# Patient Record
Sex: Male | Born: 1950
Health system: Southern US, Community
[De-identification: ages and names within clinical notes are randomized; demographics above are authoritative.]

## PROBLEM LIST (undated history)

## (undated) DIAGNOSIS — J449 Chronic obstructive pulmonary disease, unspecified: Secondary | ICD-10-CM

## (undated) DIAGNOSIS — E559 Vitamin D deficiency, unspecified: Secondary | ICD-10-CM

## (undated) DIAGNOSIS — R1310 Dysphagia, unspecified: Secondary | ICD-10-CM

## (undated) DIAGNOSIS — N390 Urinary tract infection, site not specified: Secondary | ICD-10-CM

## (undated) DIAGNOSIS — Z720 Tobacco use: Secondary | ICD-10-CM

## (undated) DIAGNOSIS — R627 Adult failure to thrive: Secondary | ICD-10-CM

## (undated) DIAGNOSIS — Z21 Asymptomatic human immunodeficiency virus [HIV] infection status: Secondary | ICD-10-CM

## (undated) DIAGNOSIS — H547 Unspecified visual loss: Secondary | ICD-10-CM

## (undated) DIAGNOSIS — N400 Enlarged prostate without lower urinary tract symptoms: Secondary | ICD-10-CM

## (undated) DIAGNOSIS — R569 Unspecified convulsions: Secondary | ICD-10-CM

## (undated) DIAGNOSIS — G9341 Metabolic encephalopathy: Secondary | ICD-10-CM

## (undated) DIAGNOSIS — B20 Human immunodeficiency virus [HIV] disease: Secondary | ICD-10-CM

## (undated) HISTORY — PX: HAND SURGERY: SHX662

---

## 2000-07-07 ENCOUNTER — Encounter: Admission: RE | Admit: 2000-07-07 | Discharge: 2000-07-07 | Payer: Self-pay | Admitting: Internal Medicine

## 2000-07-07 ENCOUNTER — Ambulatory Visit (HOSPITAL_COMMUNITY): Admission: RE | Admit: 2000-07-07 | Discharge: 2000-07-07 | Payer: Self-pay | Admitting: Internal Medicine

## 2001-02-25 ENCOUNTER — Encounter: Admission: RE | Admit: 2001-02-25 | Discharge: 2001-02-25 | Payer: Self-pay | Admitting: Hematology and Oncology

## 2001-02-25 ENCOUNTER — Ambulatory Visit (HOSPITAL_COMMUNITY): Admission: RE | Admit: 2001-02-25 | Discharge: 2001-02-25 | Payer: Self-pay | Admitting: Hematology and Oncology

## 2001-10-17 ENCOUNTER — Emergency Department (HOSPITAL_COMMUNITY): Admission: EM | Admit: 2001-10-17 | Discharge: 2001-10-17 | Payer: Self-pay | Admitting: Emergency Medicine

## 2002-09-21 ENCOUNTER — Ambulatory Visit (HOSPITAL_COMMUNITY): Admission: RE | Admit: 2002-09-21 | Discharge: 2002-09-21 | Payer: Self-pay | Admitting: Internal Medicine

## 2002-09-21 ENCOUNTER — Encounter: Admission: RE | Admit: 2002-09-21 | Discharge: 2002-09-21 | Payer: Self-pay | Admitting: Internal Medicine

## 2003-04-05 ENCOUNTER — Encounter: Admission: RE | Admit: 2003-04-05 | Discharge: 2003-04-05 | Payer: Self-pay | Admitting: Internal Medicine

## 2003-07-14 ENCOUNTER — Emergency Department (HOSPITAL_COMMUNITY): Admission: EM | Admit: 2003-07-14 | Discharge: 2003-07-14 | Payer: Self-pay | Admitting: Emergency Medicine

## 2003-09-01 ENCOUNTER — Ambulatory Visit (HOSPITAL_COMMUNITY): Admission: RE | Admit: 2003-09-01 | Discharge: 2003-09-01 | Payer: Self-pay | Admitting: Internal Medicine

## 2003-09-01 ENCOUNTER — Encounter: Admission: RE | Admit: 2003-09-01 | Discharge: 2003-09-01 | Payer: Self-pay | Admitting: Internal Medicine

## 2003-09-29 ENCOUNTER — Encounter: Admission: RE | Admit: 2003-09-29 | Discharge: 2003-09-29 | Payer: Self-pay | Admitting: Internal Medicine

## 2004-03-22 ENCOUNTER — Ambulatory Visit (HOSPITAL_COMMUNITY): Admission: RE | Admit: 2004-03-22 | Discharge: 2004-03-22 | Payer: Self-pay | Admitting: Internal Medicine

## 2004-03-22 ENCOUNTER — Encounter: Admission: RE | Admit: 2004-03-22 | Discharge: 2004-03-22 | Payer: Self-pay | Admitting: Internal Medicine

## 2004-05-30 ENCOUNTER — Inpatient Hospital Stay (HOSPITAL_COMMUNITY): Admission: EM | Admit: 2004-05-30 | Discharge: 2004-06-06 | Payer: Self-pay | Admitting: Emergency Medicine

## 2004-05-30 ENCOUNTER — Emergency Department (HOSPITAL_COMMUNITY): Admission: EM | Admit: 2004-05-30 | Discharge: 2004-05-30 | Payer: Self-pay | Admitting: Emergency Medicine

## 2004-06-13 ENCOUNTER — Emergency Department (HOSPITAL_COMMUNITY): Admission: EM | Admit: 2004-06-13 | Discharge: 2004-06-13 | Payer: Self-pay | Admitting: Emergency Medicine

## 2004-06-19 ENCOUNTER — Emergency Department (HOSPITAL_COMMUNITY): Admission: EM | Admit: 2004-06-19 | Discharge: 2004-06-19 | Payer: Self-pay | Admitting: *Deleted

## 2004-06-21 ENCOUNTER — Encounter: Admission: RE | Admit: 2004-06-21 | Discharge: 2004-06-21 | Payer: Self-pay | Admitting: Internal Medicine

## 2004-06-21 ENCOUNTER — Ambulatory Visit (HOSPITAL_COMMUNITY): Admission: RE | Admit: 2004-06-21 | Discharge: 2004-06-21 | Payer: Self-pay | Admitting: Internal Medicine

## 2004-06-21 ENCOUNTER — Encounter (INDEPENDENT_AMBULATORY_CARE_PROVIDER_SITE_OTHER): Payer: Self-pay | Admitting: *Deleted

## 2004-06-21 LAB — CONVERTED CEMR LAB
CD4 Count: 9 uL
CD4 T Cell Abs: 9
HIV 1 RNA Quant: 161000 {copies}/mL

## 2004-07-01 ENCOUNTER — Emergency Department (HOSPITAL_COMMUNITY): Admission: EM | Admit: 2004-07-01 | Discharge: 2004-07-01 | Payer: Self-pay | Admitting: Emergency Medicine

## 2004-07-09 ENCOUNTER — Inpatient Hospital Stay (HOSPITAL_COMMUNITY): Admission: EM | Admit: 2004-07-09 | Discharge: 2004-07-12 | Payer: Self-pay | Admitting: Emergency Medicine

## 2004-07-14 ENCOUNTER — Inpatient Hospital Stay (HOSPITAL_COMMUNITY): Admission: EM | Admit: 2004-07-14 | Discharge: 2004-07-20 | Payer: Self-pay | Admitting: *Deleted

## 2004-08-06 ENCOUNTER — Inpatient Hospital Stay (HOSPITAL_COMMUNITY): Admission: EM | Admit: 2004-08-06 | Discharge: 2004-08-08 | Payer: Self-pay

## 2004-08-14 ENCOUNTER — Encounter: Admission: RE | Admit: 2004-08-14 | Discharge: 2004-08-14 | Payer: Self-pay | Admitting: Internal Medicine

## 2004-08-30 ENCOUNTER — Emergency Department (HOSPITAL_COMMUNITY): Admission: EM | Admit: 2004-08-30 | Discharge: 2004-08-30 | Payer: Self-pay | Admitting: *Deleted

## 2004-09-07 ENCOUNTER — Emergency Department (HOSPITAL_COMMUNITY): Admission: EM | Admit: 2004-09-07 | Discharge: 2004-09-07 | Payer: Self-pay | Admitting: Emergency Medicine

## 2004-09-10 ENCOUNTER — Emergency Department (HOSPITAL_COMMUNITY): Admission: EM | Admit: 2004-09-10 | Discharge: 2004-09-10 | Payer: Self-pay | Admitting: Emergency Medicine

## 2004-10-08 ENCOUNTER — Ambulatory Visit (HOSPITAL_COMMUNITY): Admission: RE | Admit: 2004-10-08 | Discharge: 2004-10-08 | Payer: Self-pay | Admitting: Infectious Diseases

## 2004-10-08 ENCOUNTER — Ambulatory Visit: Payer: Self-pay | Admitting: Infectious Diseases

## 2004-10-08 ENCOUNTER — Encounter (INDEPENDENT_AMBULATORY_CARE_PROVIDER_SITE_OTHER): Payer: Self-pay | Admitting: *Deleted

## 2004-10-08 LAB — CONVERTED CEMR LAB: CD4 Count: 9 microliters

## 2004-11-24 ENCOUNTER — Emergency Department (HOSPITAL_COMMUNITY): Admission: EM | Admit: 2004-11-24 | Discharge: 2004-11-24 | Payer: Self-pay | Admitting: Emergency Medicine

## 2005-08-31 IMAGING — CT CT CERVICAL SPINE W/O CM
3 of 4 series · 16 of 33 positions shown, 19 images · non-contrast
Comparison: none

[Series 5028: — · axial · 0.41mm/px · z∈[-802,-652]mm · 8 of 126 slices shown, 10 images (1 of 3)]
[im 13/126  soft-tissue]
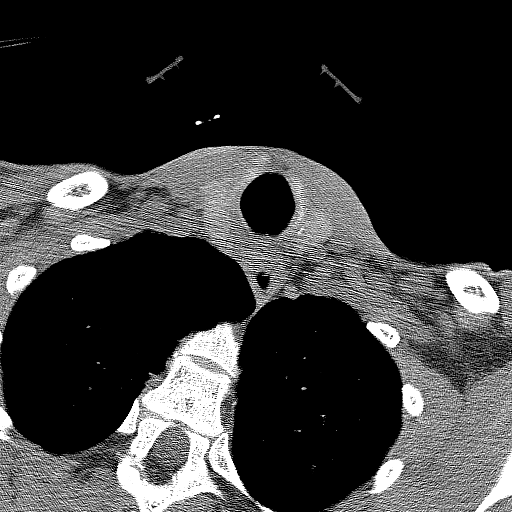
[im 13/126  bone]
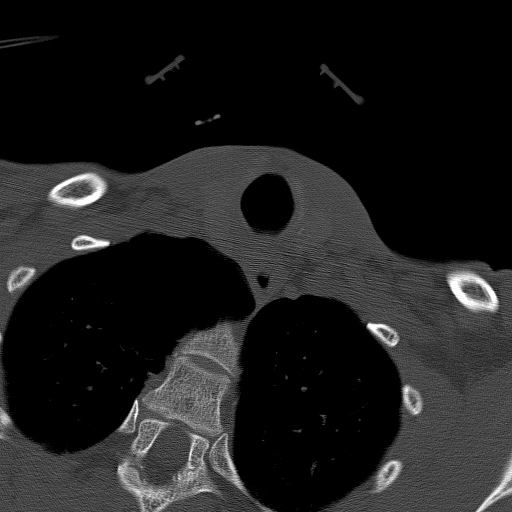
[im 26/126  bone]
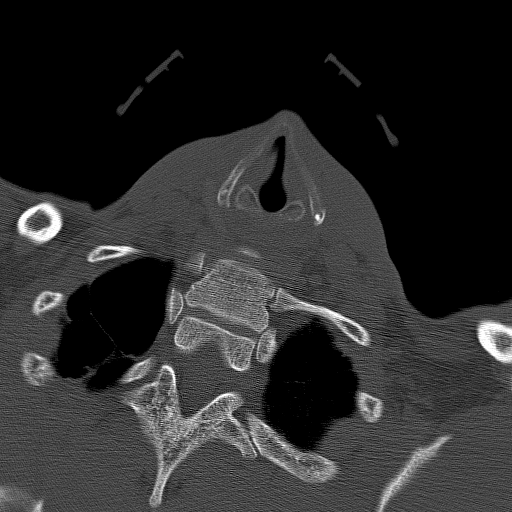
[im 38/126  bone]
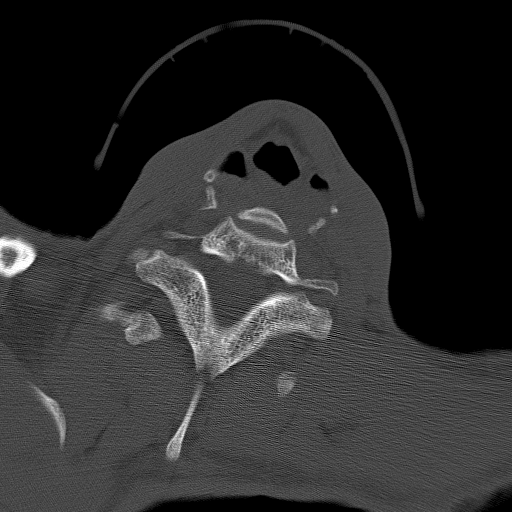
[im 51/126  bone]
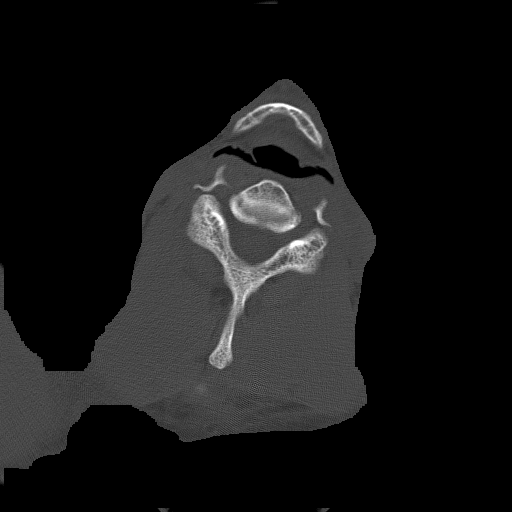
[im 76/126  soft-tissue]
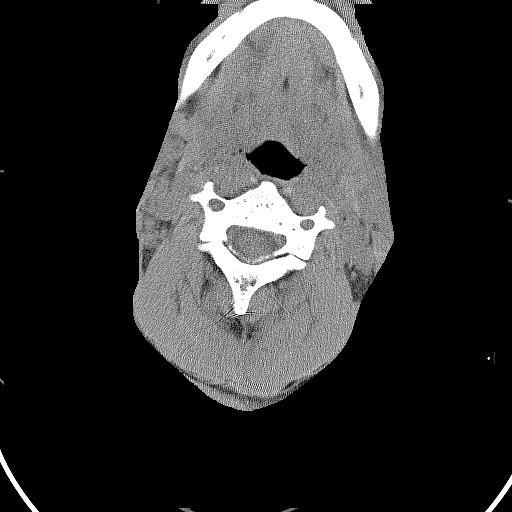
[im 76/126  bone]
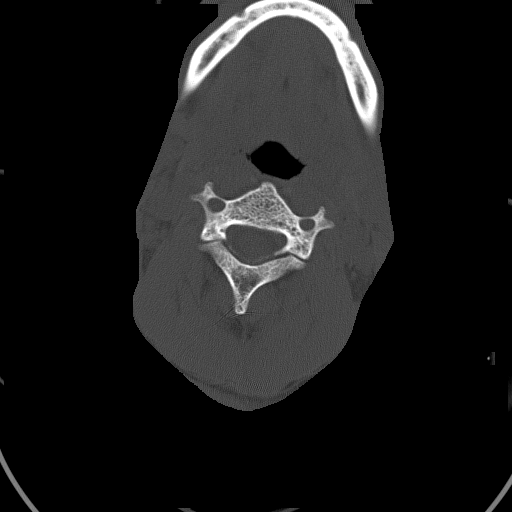
[im 88/126  bone]
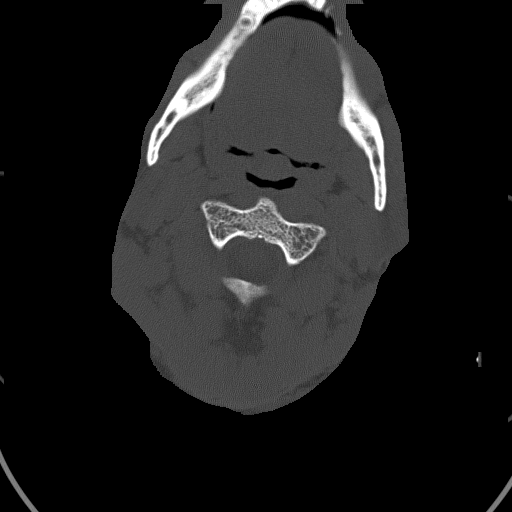
[im 101/126  bone]
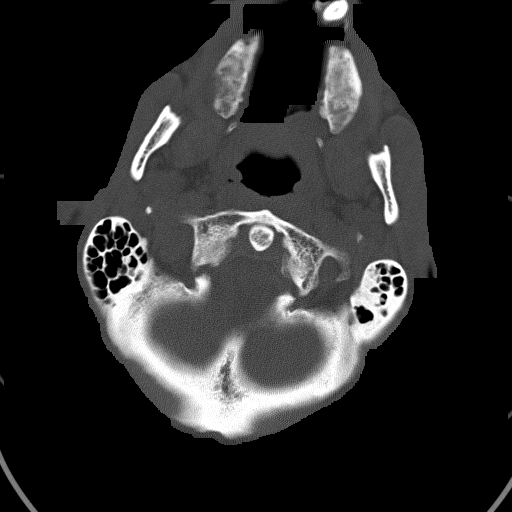
[im 113/126  bone]
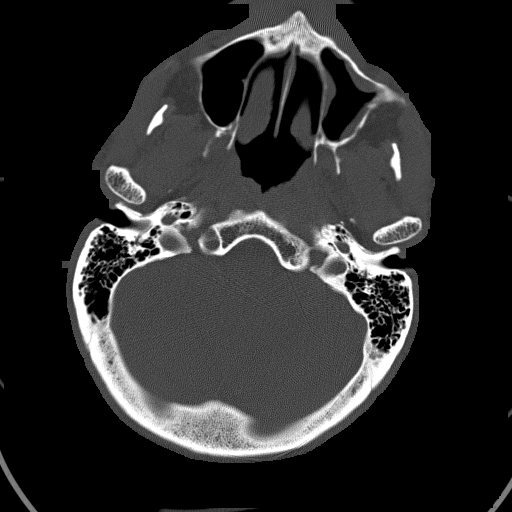

[— · coronal · 0.41mm/px · 3 of 30 slices shown (2 of 3)]
[im 6/30  bone]
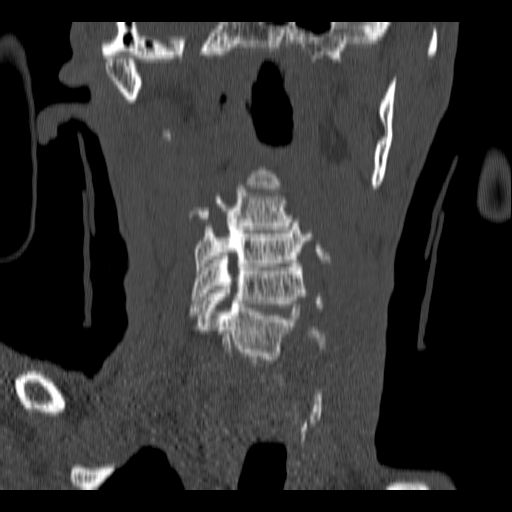
[im 12/30  bone]
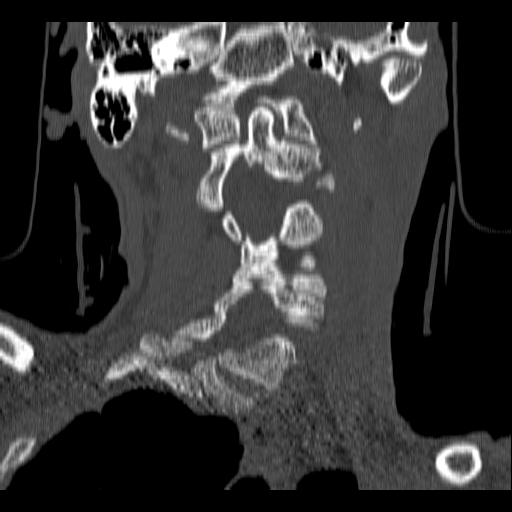
[im 18/30  bone]
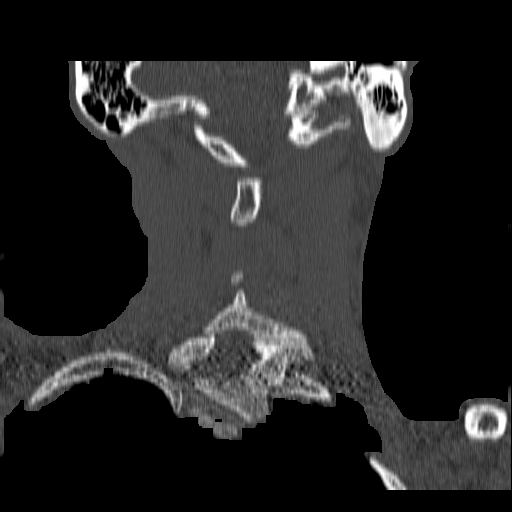

[— · sagittal · 0.41mm/px · 5 of 40 slices shown, 6 images (3 of 3)]
[im 14/40  bone]
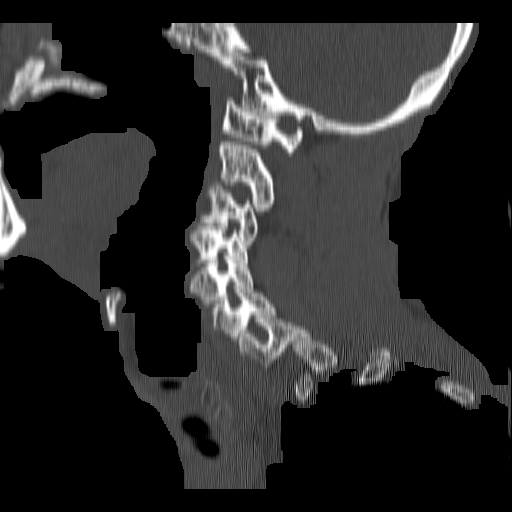
[im 17/40  bone]
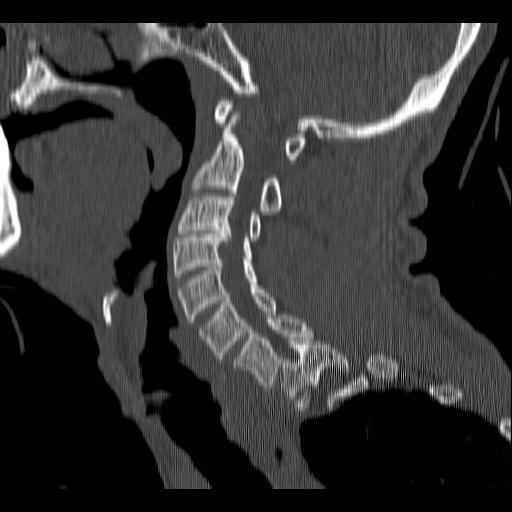
[im 20/40  soft-tissue]
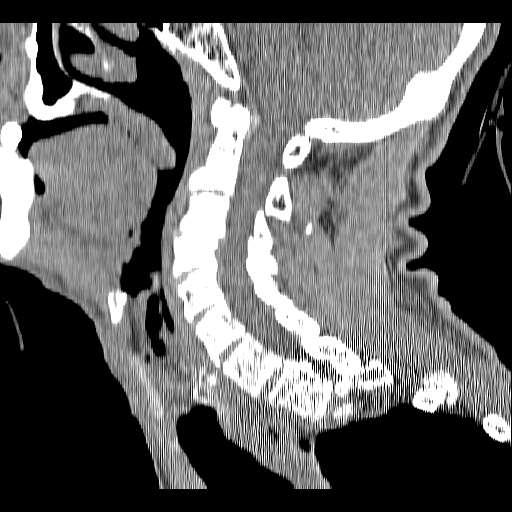
[im 20/40  bone]
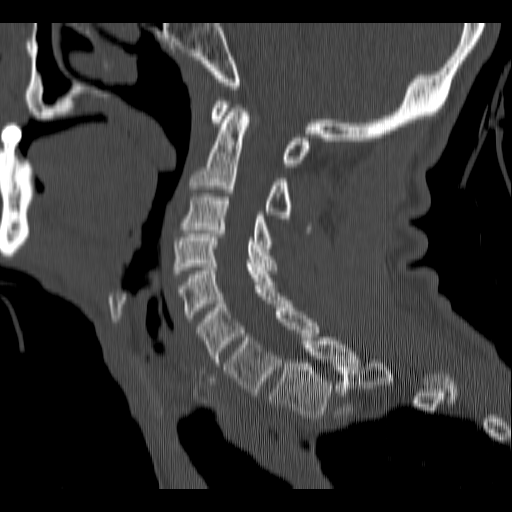
[im 23/40  bone]
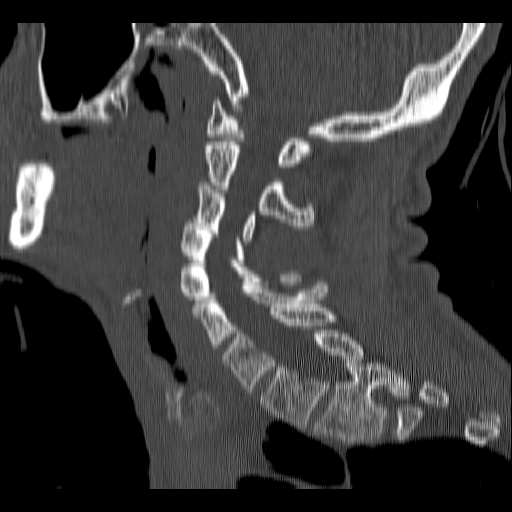
[im 27/40  bone]
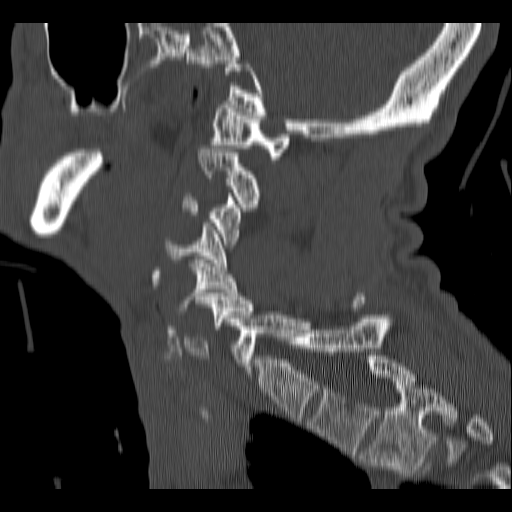

[16 of 33 positions shown; findings below may reference images not displayed]

<!--  IDXRADR:ADDEND:BEGIN -->Addendum Begins<!--  IDXRADR:ADDEND:INNER_BEGIN -->The above report for the repeat CT of the head performed at 2666 hours fails to indicate that the exam was performed following IV contrast administration of 100 cc Omnipaque intravenously.  No pathologic intracranial enhancement is seen following contrast.
 IMPRESSION
 No acute intracranial abnormalities.

 <!--  IDXRADR:ADDEND:INNER_END -->Addendum Ends
<!--  IDXRADR:ADDEND:END -->Clinical Data: Patient with seizures, confusion.

 CT OF THE CERVICAL SPINE WITH MULTIPLANAR RECONSTRUCTIONS
 Axial images were made of the cervical spine and demonstrate considerable scoliosis.  There is a fluid level in the left maxillary sinus with some mucosal thickening.  There is no acute bone abnormality of the skull base or the cervical spine.  

 The multiplanar reconstructions are somewhat compromised due to the patient's scoliosis.  There is degenerative disk disease demonstrated at C3-4.   Normal alignment is present with no gross facet abnormality. 

 IMPRESSION
 No fracture. 

 Degenerative disk disease, C3-4. 

 CT BRAIN SCAN WITHOUT CONTRAST
 This is a repeat brain scan without contrast.  The patient fell since the prior exam.  The first exam on 05/30/04 was performed at 9777 PM and the second was performed at 2666 PM.  5 mm images through the brain without contrast enhancement demonstrate minimal atrophy.  There is no intracerebral mass effect or hemorrhage.  The ventricular system is normal.  There is some mucosal thickening of the left maxillary sinus with a small fluid level. 

 IMPRESSION
 No acute intracranial abnormality, unchanged. 

 Very small fluid level in the left maxillary sinus with mucosal thickening in the left maxillary sinus. 

 [REDACTED]

## 2005-09-06 IMAGING — CR DG CHEST 2V
2 series · 2 of 2 positions shown · non-contrast
Comparison: none

CLINICAL DATA: Chest pain/short of breath.  
 CHEST TWO VIEWS
 Two view chest with comparison 09/03/99.
 Moderate COPD.  There is an abnormal density in the right upper lobe that is either an occult infiltrate or nodule.  This is seen only on the PA view.  It is a new finding and warrants careful follow-up.  Consider CT if this is not resolved quickly.
 IMPRESSION
 1.  COPD.  
 2.  Right upper lobe nodule or infiltrate--needs careful follow-up.

[view not recorded (1 of 2)]
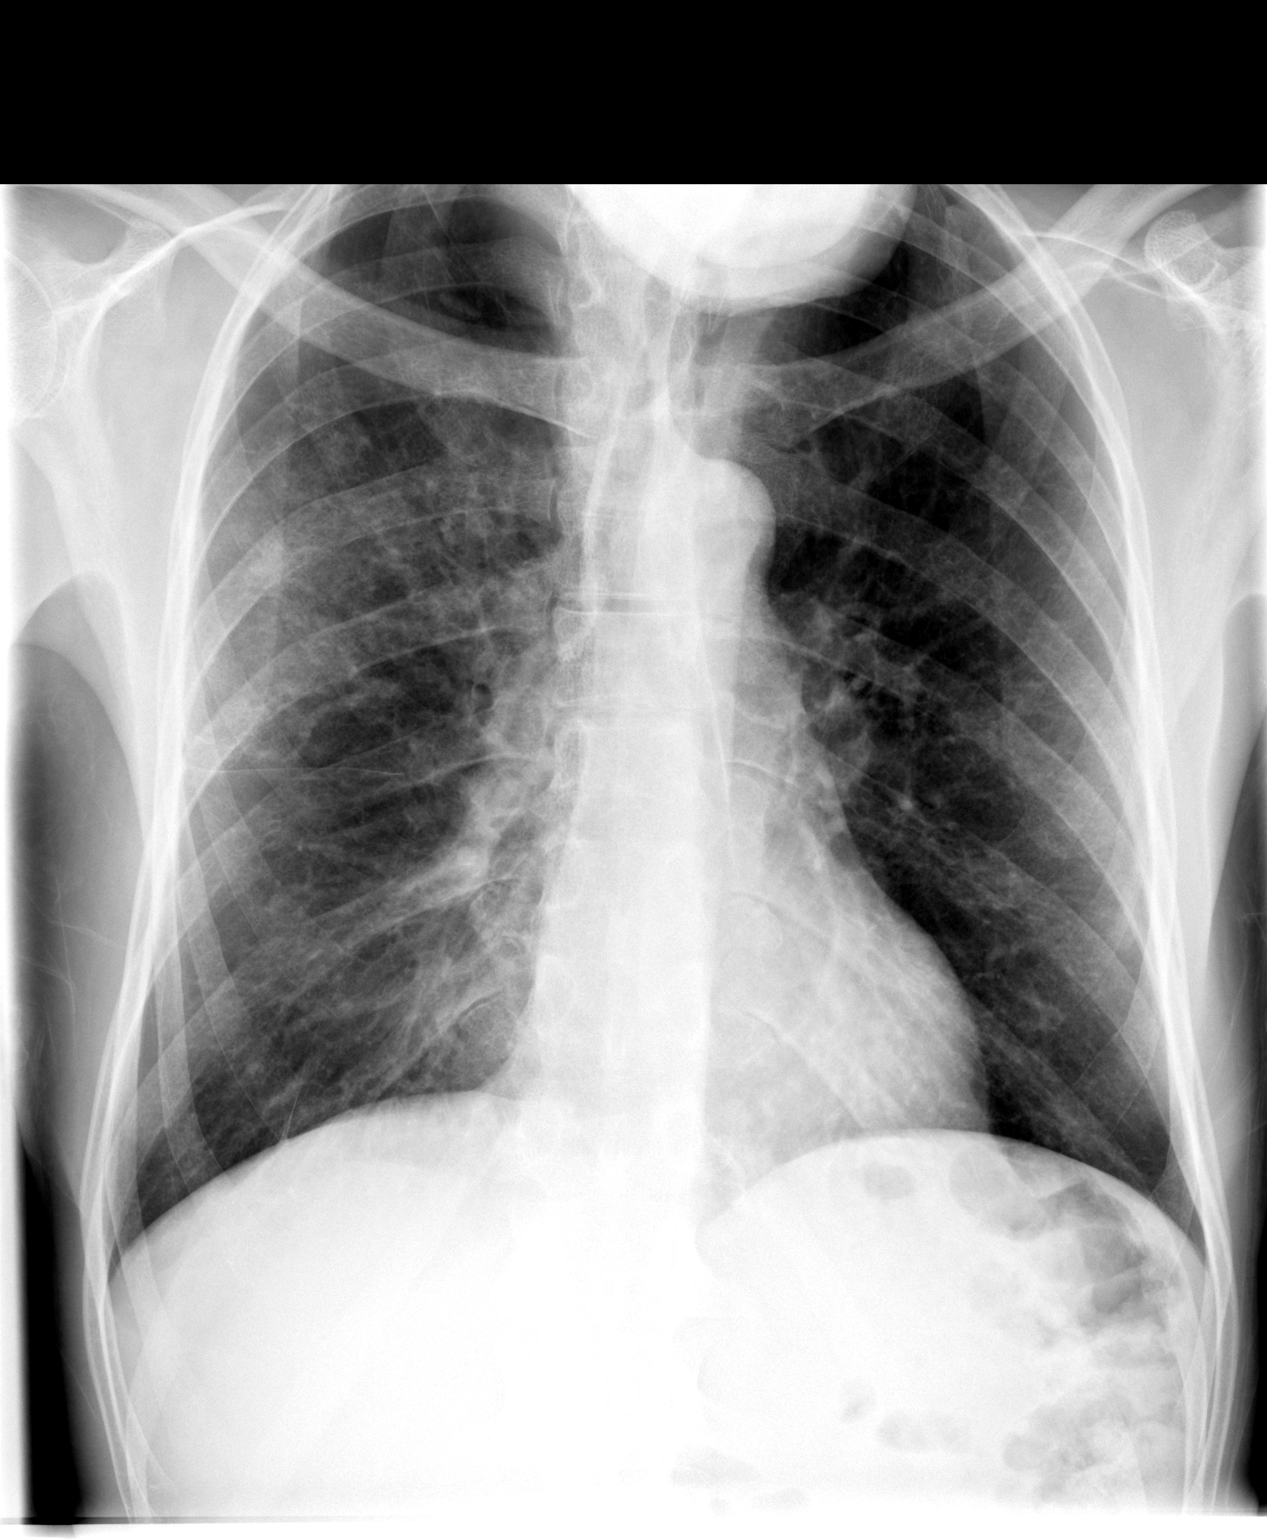

[view not recorded (2 of 2)]
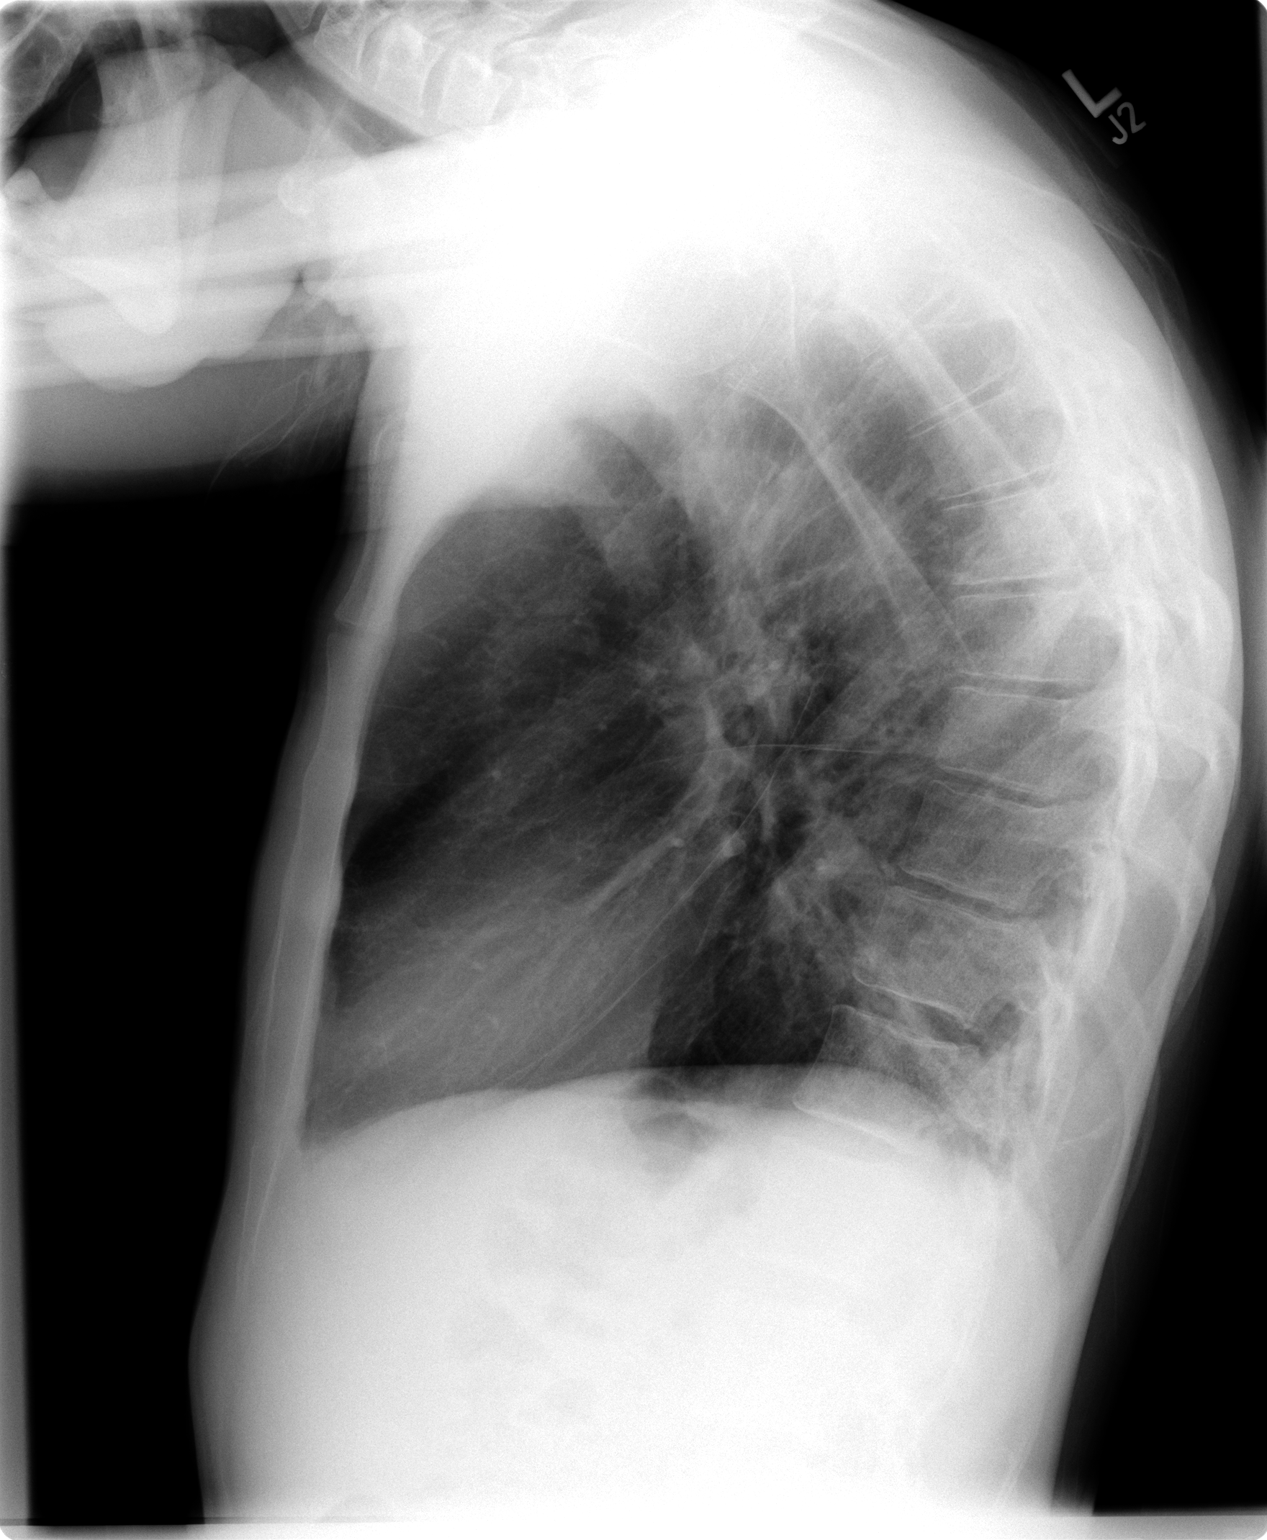

[2 of 2 positions shown; findings below may reference images not displayed]

## 2006-05-23 ENCOUNTER — Encounter (INDEPENDENT_AMBULATORY_CARE_PROVIDER_SITE_OTHER): Payer: Self-pay | Admitting: Infectious Diseases

## 2006-07-09 ENCOUNTER — Ambulatory Visit: Payer: Self-pay | Admitting: Internal Medicine

## 2006-07-09 ENCOUNTER — Encounter (INDEPENDENT_AMBULATORY_CARE_PROVIDER_SITE_OTHER): Payer: Self-pay | Admitting: *Deleted

## 2006-07-09 ENCOUNTER — Encounter: Admission: RE | Admit: 2006-07-09 | Discharge: 2006-07-09 | Payer: Self-pay | Admitting: Infectious Diseases

## 2006-07-09 LAB — CONVERTED CEMR LAB: CD4 Count: 330 microliters

## 2006-07-23 ENCOUNTER — Ambulatory Visit: Payer: Self-pay | Admitting: Internal Medicine

## 2006-10-22 DIAGNOSIS — A6000 Herpesviral infection of urogenital system, unspecified: Secondary | ICD-10-CM | POA: Insufficient documentation

## 2006-10-22 DIAGNOSIS — B59 Pneumocystosis: Secondary | ICD-10-CM | POA: Insufficient documentation

## 2006-10-22 DIAGNOSIS — J449 Chronic obstructive pulmonary disease, unspecified: Secondary | ICD-10-CM

## 2006-10-22 DIAGNOSIS — J4489 Other specified chronic obstructive pulmonary disease: Secondary | ICD-10-CM | POA: Insufficient documentation

## 2006-10-22 DIAGNOSIS — B20 Human immunodeficiency virus [HIV] disease: Secondary | ICD-10-CM

## 2006-10-22 DIAGNOSIS — R569 Unspecified convulsions: Secondary | ICD-10-CM

## 2007-01-23 ENCOUNTER — Encounter (INDEPENDENT_AMBULATORY_CARE_PROVIDER_SITE_OTHER): Payer: Self-pay | Admitting: Infectious Diseases

## 2007-02-09 ENCOUNTER — Encounter: Payer: Self-pay | Admitting: Internal Medicine

## 2007-02-23 ENCOUNTER — Encounter (INDEPENDENT_AMBULATORY_CARE_PROVIDER_SITE_OTHER): Payer: Self-pay | Admitting: *Deleted

## 2007-02-23 LAB — CONVERTED CEMR LAB

## 2007-02-25 ENCOUNTER — Emergency Department (HOSPITAL_COMMUNITY): Admission: EM | Admit: 2007-02-25 | Discharge: 2007-02-25 | Payer: Self-pay | Admitting: Emergency Medicine

## 2007-03-08 ENCOUNTER — Encounter (INDEPENDENT_AMBULATORY_CARE_PROVIDER_SITE_OTHER): Payer: Self-pay | Admitting: *Deleted

## 2007-03-18 ENCOUNTER — Ambulatory Visit: Payer: Self-pay | Admitting: Internal Medicine

## 2007-03-18 ENCOUNTER — Encounter: Admission: RE | Admit: 2007-03-18 | Discharge: 2007-03-18 | Payer: Self-pay | Admitting: Internal Medicine

## 2007-03-18 LAB — CONVERTED CEMR LAB
ALT: 20 units/L (ref 0–53)
AST: 15 units/L (ref 0–37)
Alkaline Phosphatase: 123 units/L — ABNORMAL HIGH (ref 39–117)
BUN: 16 mg/dL (ref 6–23)
CO2: 20 meq/L (ref 19–32)
Chloride: 102 meq/L (ref 96–112)
Creatinine, Ser: 1.08 mg/dL (ref 0.40–1.50)
Eosinophils Absolute: 0.1 10*3/uL (ref 0.0–0.7)
Glucose, Bld: 82 mg/dL (ref 70–99)
Lymphocytes Relative: 45 % (ref 12–46)
Lymphs Abs: 2.2 10*3/uL (ref 0.7–3.3)
MCV: 97.2 fL (ref 78.0–100.0)
Monocytes Relative: 10 % (ref 3–11)
Neutro Abs: 2.1 10*3/uL (ref 1.7–7.7)
Neutrophils Relative %: 43 % (ref 43–77)
Potassium: 4.3 meq/L (ref 3.5–5.3)
RBC: 4.32 M/uL (ref 4.22–5.81)
Sodium: 134 meq/L — ABNORMAL LOW (ref 135–145)
Total Bilirubin: 0.7 mg/dL (ref 0.3–1.2)
WBC: 5 10*3/uL (ref 4.0–10.5)

## 2007-04-07 ENCOUNTER — Emergency Department (HOSPITAL_COMMUNITY): Admission: EM | Admit: 2007-04-07 | Discharge: 2007-04-07 | Payer: Self-pay | Admitting: Emergency Medicine

## 2007-04-07 ENCOUNTER — Telehealth: Payer: Self-pay | Admitting: Internal Medicine

## 2007-04-20 ENCOUNTER — Telehealth: Payer: Self-pay | Admitting: Internal Medicine

## 2007-05-14 ENCOUNTER — Telehealth: Payer: Self-pay | Admitting: Infectious Diseases

## 2007-06-12 ENCOUNTER — Emergency Department (HOSPITAL_COMMUNITY): Admission: EM | Admit: 2007-06-12 | Discharge: 2007-06-12 | Payer: Self-pay | Admitting: Emergency Medicine

## 2007-06-30 ENCOUNTER — Telehealth: Payer: Self-pay | Admitting: Internal Medicine

## 2007-07-27 ENCOUNTER — Telehealth: Payer: Self-pay | Admitting: Internal Medicine

## 2007-08-14 ENCOUNTER — Encounter (INDEPENDENT_AMBULATORY_CARE_PROVIDER_SITE_OTHER): Payer: Self-pay | Admitting: *Deleted

## 2007-08-19 ENCOUNTER — Encounter: Admission: RE | Admit: 2007-08-19 | Discharge: 2007-08-19 | Payer: Self-pay | Admitting: Internal Medicine

## 2007-08-19 ENCOUNTER — Ambulatory Visit: Payer: Self-pay | Admitting: Internal Medicine

## 2007-08-19 DIAGNOSIS — H409 Unspecified glaucoma: Secondary | ICD-10-CM

## 2007-08-19 DIAGNOSIS — K029 Dental caries, unspecified: Secondary | ICD-10-CM | POA: Insufficient documentation

## 2007-08-19 LAB — CONVERTED CEMR LAB
Albumin: 4.2 g/dL (ref 3.5–5.2)
Alkaline Phosphatase: 121 units/L — ABNORMAL HIGH (ref 39–117)
BUN: 13 mg/dL (ref 6–23)
Calcium: 10.1 mg/dL (ref 8.4–10.5)
Eosinophils Absolute: 0.2 10*3/uL (ref 0.0–0.7)
Glucose, Bld: 69 mg/dL — ABNORMAL LOW (ref 70–99)
HCT: 44.2 % (ref 39.0–52.0)
HDL: 32 mg/dL — ABNORMAL LOW (ref >39)
HIV 1 RNA Quant: <50 copies/mL (ref <50)
HIV-1 RNA Quant, Log: <1.7 (ref <1.70)
Hemoglobin: 14.9 g/dL (ref 13.0–17.0)
LDL Cholesterol: 111 mg/dL — ABNORMAL HIGH (ref 0–99)
Lymphs Abs: 2.7 10*3/uL (ref 0.7–3.3)
MCV: 98.9 fL (ref 78.0–100.0)
Monocytes Absolute: 0.4 10*3/uL (ref 0.2–0.7)
Monocytes Relative: 9 % (ref 3–11)
Neutrophils Relative %: 34 % — ABNORMAL LOW (ref 43–77)
Potassium: 4.1 meq/L (ref 3.5–5.3)
RBC: 4.47 M/uL (ref 4.22–5.81)
Triglycerides: 109 mg/dL (ref <150)
WBC: 5 10*3/uL (ref 4.0–10.5)

## 2007-09-01 ENCOUNTER — Telehealth: Payer: Self-pay | Admitting: Internal Medicine

## 2007-09-23 ENCOUNTER — Emergency Department (HOSPITAL_COMMUNITY): Admission: EM | Admit: 2007-09-23 | Discharge: 2007-09-23 | Payer: Self-pay | Admitting: Emergency Medicine

## 2007-09-30 ENCOUNTER — Telehealth: Payer: Self-pay | Admitting: Internal Medicine

## 2007-10-01 ENCOUNTER — Telehealth: Payer: Self-pay | Admitting: Internal Medicine

## 2007-10-13 ENCOUNTER — Telehealth: Payer: Self-pay | Admitting: Internal Medicine

## 2007-10-20 ENCOUNTER — Encounter: Payer: Self-pay | Admitting: Internal Medicine

## 2007-11-14 ENCOUNTER — Emergency Department (HOSPITAL_COMMUNITY): Admission: EM | Admit: 2007-11-14 | Discharge: 2007-11-14 | Payer: Self-pay | Admitting: Emergency Medicine

## 2007-11-18 ENCOUNTER — Encounter: Admission: RE | Admit: 2007-11-18 | Discharge: 2007-11-18 | Payer: Self-pay | Admitting: Internal Medicine

## 2007-11-18 ENCOUNTER — Ambulatory Visit: Payer: Self-pay | Admitting: Internal Medicine

## 2007-11-18 LAB — CONVERTED CEMR LAB
Alkaline Phosphatase: 116 units/L (ref 39–117)
BUN: 11 mg/dL (ref 6–23)
Eosinophils Absolute: 0.1 10*3/uL — ABNORMAL LOW (ref 0.2–0.7)
Eosinophils Relative: 2 % (ref 0–5)
Glucose, Bld: 74 mg/dL (ref 70–99)
HCT: 41.9 % (ref 39.0–52.0)
HIV-1 RNA Quant, Log: 1.89 — ABNORMAL HIGH (ref <1.70)
Lymphs Abs: 2.3 10*3/uL (ref 0.7–4.0)
MCV: 98.4 fL (ref 78.0–100.0)
Monocytes Absolute: 0.5 10*3/uL (ref 0.1–1.0)
Monocytes Relative: 11 % (ref 3–12)
RBC: 4.26 M/uL (ref 4.22–5.81)
Total Bilirubin: 0.5 mg/dL (ref 0.3–1.2)
WBC: 4.7 10*3/uL (ref 4.0–10.5)

## 2008-02-08 ENCOUNTER — Telehealth: Payer: Self-pay | Admitting: Internal Medicine

## 2008-02-16 ENCOUNTER — Encounter (INDEPENDENT_AMBULATORY_CARE_PROVIDER_SITE_OTHER): Payer: Self-pay | Admitting: *Deleted

## 2008-03-30 ENCOUNTER — Ambulatory Visit: Payer: Self-pay | Admitting: Internal Medicine

## 2008-03-30 ENCOUNTER — Encounter: Admission: RE | Admit: 2008-03-30 | Discharge: 2008-03-30 | Payer: Self-pay | Admitting: Internal Medicine

## 2008-03-30 LAB — CONVERTED CEMR LAB
Albumin: 4.3 g/dL (ref 3.5–5.2)
CO2: 23 meq/L (ref 19–32)
Calcium: 9.9 mg/dL (ref 8.4–10.5)
Chloride: 104 meq/L (ref 96–112)
Cholesterol: 157 mg/dL (ref 0–200)
Eosinophils Relative: 3 % (ref 0–5)
Glucose, Bld: 74 mg/dL (ref 70–99)
HCT: 44.9 % (ref 39.0–52.0)
HIV 1 RNA Quant: <50 copies/mL (ref <50)
HIV-1 RNA Quant, Log: <1.7 (ref <1.70)
Hemoglobin: 15.2 g/dL (ref 13.0–17.0)
Lymphocytes Relative: 41 % (ref 12–46)
Lymphs Abs: 2.4 10*3/uL (ref 0.7–4.0)
Monocytes Absolute: 0.5 10*3/uL (ref 0.1–1.0)
Neutro Abs: 2.9 10*3/uL (ref 1.7–7.7)
Platelets: 154 10*3/uL (ref 150–400)
Sodium: 138 meq/L (ref 135–145)
Total Bilirubin: 0.3 mg/dL (ref 0.3–1.2)
Total Protein: 7.6 g/dL (ref 6.0–8.3)
Triglycerides: 95 mg/dL (ref <150)
VLDL: 19 mg/dL (ref 0–40)
WBC: 6 10*3/uL (ref 4.0–10.5)

## 2008-04-13 ENCOUNTER — Ambulatory Visit: Payer: Self-pay | Admitting: Internal Medicine

## 2008-04-13 DIAGNOSIS — F528 Other sexual dysfunction not due to a substance or known physiological condition: Secondary | ICD-10-CM

## 2008-04-14 LAB — CONVERTED CEMR LAB: Testosterone: 736.25 ng/dL (ref 350–890)

## 2008-04-20 ENCOUNTER — Telehealth: Payer: Self-pay | Admitting: Internal Medicine

## 2009-04-14 ENCOUNTER — Ambulatory Visit: Payer: Self-pay | Admitting: Internal Medicine

## 2009-04-14 LAB — CONVERTED CEMR LAB
CO2: 25 meq/L (ref 19–32)
Chlamydia, Swab/Urine, PCR: NEGATIVE
Creatinine, Ser: 0.88 mg/dL (ref 0.40–1.50)
Eosinophils Absolute: 0.2 10*3/uL (ref 0.0–0.7)
GC Probe Amp, Urine: NEGATIVE
GFR calc Af Amer: >60 mL/min (ref >60)
GFR calc non Af Amer: >60 mL/min (ref >60)
Glucose, Bld: 81 mg/dL (ref 70–99)
HDL: 43 mg/dL (ref >39)
HIV 1 RNA Quant: <48 copies/mL (ref <48)
HIV-1 RNA Quant, Log: <1.68 (ref <1.68)
Ketones, ur: NEGATIVE mg/dL
Leukocytes, UA: NEGATIVE
Lymphocytes Relative: 44 % (ref 12–46)
Lymphs Abs: 2.7 10*3/uL (ref 0.7–4.0)
MCV: 103.1 fL — ABNORMAL HIGH (ref 78.0–100.0)
Neutrophils Relative %: 42 % — ABNORMAL LOW (ref 43–77)
Nitrite: NEGATIVE
Platelets: 153 10*3/uL (ref 150–400)
Specific Gravity, Urine: 1.004 — ABNORMAL LOW (ref 1.005–1.030)
Total Bilirubin: 0.2 mg/dL — ABNORMAL LOW (ref 0.3–1.2)
WBC: 6.3 10*3/uL (ref 4.0–10.5)
pH: 7 (ref 5.0–8.0)

## 2009-04-28 ENCOUNTER — Ambulatory Visit: Payer: Self-pay | Admitting: Internal Medicine

## 2010-01-31 ENCOUNTER — Encounter (INDEPENDENT_AMBULATORY_CARE_PROVIDER_SITE_OTHER): Payer: Self-pay | Admitting: *Deleted

## 2010-04-23 ENCOUNTER — Telehealth: Payer: Self-pay | Admitting: Internal Medicine

## 2010-05-11 ENCOUNTER — Telehealth (INDEPENDENT_AMBULATORY_CARE_PROVIDER_SITE_OTHER): Payer: Self-pay | Admitting: *Deleted

## 2010-06-21 ENCOUNTER — Ambulatory Visit: Payer: Self-pay | Admitting: Internal Medicine

## 2010-06-21 LAB — CONVERTED CEMR LAB: HIV-1 RNA Quant, Log: <1.68 (ref <1.68)

## 2010-06-22 LAB — CONVERTED CEMR LAB
ALT: 37 units/L (ref 0–53)
AST: 26 units/L (ref 0–37)
Albumin: 4.5 g/dL (ref 3.5–5.2)
Alkaline Phosphatase: 129 units/L — ABNORMAL HIGH (ref 39–117)
Calcium: 10.6 mg/dL — ABNORMAL HIGH (ref 8.4–10.5)
Chloride: 104 meq/L (ref 96–112)
HCT: 44.4 % (ref 39.0–52.0)
LDL Cholesterol: 159 mg/dL — ABNORMAL HIGH (ref 0–99)
Lymphocytes Relative: 51 % — ABNORMAL HIGH (ref 12–46)
Lymphs Abs: 3.4 10*3/uL (ref 0.7–4.0)
Neutro Abs: 2.1 10*3/uL (ref 1.7–7.7)
Neutrophils Relative %: 32 % — ABNORMAL LOW (ref 43–77)
Phenytoin Lvl: 2.8 ug/mL — ABNORMAL LOW (ref 10.0–20.0)
Platelets: 177 10*3/uL (ref 150–400)
Potassium: 4.4 meq/L (ref 3.5–5.3)
Sodium: 138 meq/L (ref 135–145)
WBC: 6.6 10*3/uL (ref 4.0–10.5)

## 2010-09-21 ENCOUNTER — Encounter: Payer: Self-pay | Admitting: Internal Medicine

## 2010-11-21 ENCOUNTER — Encounter (INDEPENDENT_AMBULATORY_CARE_PROVIDER_SITE_OTHER): Payer: Self-pay | Admitting: *Deleted

## 2011-01-21 ENCOUNTER — Ambulatory Visit: Admit: 2011-01-21 | Payer: Self-pay | Admitting: Internal Medicine

## 2011-01-29 NOTE — Miscellaneous (Signed)
  Clinical Lists Changes  Observations: Added new observation of YEARAIDSPOS: 2005  (11/21/2010 9:07)

## 2011-01-29 NOTE — Miscellaneous (Signed)
Summary: RW Update  Clinical Lists Changes  Observations: Added new observation of HIV STATUS: CDC-defined AIDS (01/31/2010 16:57) 

## 2011-01-29 NOTE — Assessment & Plan Note (Signed)
Summary: MED REFILL/VS   CC:  follow-up visit, has been out of Acyclovir and Combiver for five days, and but has still been taking Kaletra.  History of Present Illness: Pt has been doing well.  He ran out of his combivir about 3 days ago.  No new problems.  Preventive Screening-Counseling & Management  Alcohol-Tobacco     Alcohol drinks/day: 0     Smoking Status: current     Packs/Day: 1.5  Caffeine-Diet-Exercise     Caffeine use/day: coffee 1 per day     Does Patient Exercise: yes     Type of exercise: walking     Exercise (avg: min/session): 30-60     Times/week: 7  Hep-HIV-STD-Contraception     HIV Risk: no  Safety-Violence-Falls     Seat Belt Use: yes      Sexual History:  dating.        Drug Use:  never.    Comments: pt. given condoms   Updated Prior Medication List: KALETRA 200-50 MG TABS (LOPINAVIR-RITONAVIR) two tabs by mouth two times a day COMBIVIR 150-300 MG TABS (LAMIVUDINE-ZIDOVUDINE) one tab by mouth two times a day DILANTIN 100 MG CAPS (PHENYTOIN SODIUM EXTENDED) one tab by mouth two times a day with an additional 100 mg on Monday, Wednesday and Friday [BMN] ACYCLOVIR 400 MG  TABS (ACYCLOVIR) Take 1 tablet by mouth two times a day VIAGRA 25 MG  TABS (SILDENAFIL CITRATE) Take one tablet by mouth as directed, do not use more than one tablet at a time.  Current Allergies (reviewed today): ! * PCN Past History:  Past Medical History: Last updated: 10/22/2006 COPD HIV disease Seizure disorder  Social History: Sexual History:  dating Drug Use:  never  Review of Systems  The patient denies anorexia, fever, and weight loss.    Vital Signs:  Patient profile:   60 year old male Height:      71 inches (180.34 cm) Weight:      159.0 pounds (72.27 kg) BMI:     22.26 Temp:     98.3 degrees F (36.83 degrees C) oral Pulse rate:   89 / minute BP sitting:   125 / 80  (left arm)  Vitals Entered By: Wendall Mola CMA Duncan Dull) (June 21, 2010 3:41  PM) CC: follow-up visit, has been out of Acyclovir and Combiver for five days, but has still been taking Kaletra Is Patient Diabetic? No Pain Assessment Patient in pain? no      Nutritional Status BMI of 19 -24 = normal Nutritional Status Detail appetite  "good"  Does patient need assistance? Functional Status Self care Ambulation Normal   Physical Exam  General:  alert, well-developed, well-nourished, and well-hydrated.   Head:  normocephalic and atraumatic.   Mouth:  pharynx pink and moist.   Lungs:  normal breath sounds.      Impression & Recommendations:  Problem # 1:  HIV DISEASE (ICD-042) Will obtain labs today and refill meds.  I discussed the need for him to take his meds every day and not miss doses.  He will call for results of labs and then f/u in 3 months for repeat labs if everything looks good. Diagnostics Reviewed:  HIV: CDC-defined AIDS (01/31/2010)   CD4: 750 (04/14/2009)   WBC: 6.3 (04/14/2009)   Hgb: 14.3 (04/14/2009)   HCT: 43.3 (04/14/2009)   Platelets: 153 (04/14/2009) HIV-1 RNA: <48 copies/mL (04/14/2009)   HBSAg: NO (02/23/2007)  Problem # 2:  SEIZURE DISORDER (ICD-780.39) check dilanttin level  no recent siezure activity His updated medication list for this problem includes:    Dilantin 100 Mg Caps (Phenytoin sodium extended) ..... One tab by mouth two times a day with an additional 100 mg on monday, wednesday and friday  Other Orders: T-CD4SP [facility]) (CD4SP) T-HIV Viral Load [phone]) T-Comprehensive Metabolic Panel [phone]) T-Lipid Profile [phone]) T-CBC w/Diff [phone]) T-RPR (Syphilis) (28413-24401) Est. Patient Level III (02725) T-Dilantin (Phenytoin) (36644-03474) Future Orders: T-CD4SP ([facility]) (CD4SP) ... 09/19/2010 T-HIV Viral Load [phone]) ... 09/19/2010 T-Comprehensive Metabolic Panel [phone]) ... 09/19/2010 T-CBC w/Diff ([id number]) ... 09/19/2010  Patient Instructions: 1)  Please schedule a  follow-up appointment in 3 months, 2 weeks after labs.  Prescriptions: VIAGRA 25 MG  TABS (SILDENAFIL CITRATE) Take one tablet by mouth as directed, do not use more than one tablet at a time.  #10 x 0   Entered and Authorized by:   Yisroel Ramming MD   Signed by:   Yisroel Ramming MD on 06/21/2010   Method used:   Print then Give to Patient   RxID:   [id number] ACYCLOVIR 400 MG  TABS (ACYCLOVIR) Take 1 tablet by mouth two times a day  #60 x 5   Entered and Authorized by:   Yisroel Ramming MD   Signed by:   Yisroel Ramming MD on 06/21/2010   Method used:   Print then Give to Patient   RxID:   [id number] DILANTIN 100 MG CAPS (PHENYTOIN SODIUM EXTENDED) one tab by mouth two times a day with an additional 100 mg on Monday, Wednesday and Friday Brand medically necessary #84 Each x 5   Entered and Authorized by:   Yisroel Ramming MD   Signed by:   Yisroel Ramming MD on 06/21/2010   Method used:   Print then Give to Patient   RxID:   [id number] COMBIVIR 150-300 MG TABS (LAMIVUDINE-ZIDOVUDINE) one tab by mouth two times a day  #60 Each x 5   Entered and Authorized by:   Yisroel Ramming MD   Signed by:   Yisroel Ramming MD on 06/21/2010   Method used:   Print then Give to Patient   RxID:   [id number] BZJIRCV 200-50 MG TABS (LOPINAVIR-RITONAVIR) two tabs by mouth two times a day  #120 x 5   Entered and Authorized by:   Yisroel Ramming MD   Signed by:   Yisroel Ramming MD on 06/21/2010   Method used:   Print then Give to Patient   RxID:   [id number]

## 2011-01-29 NOTE — Miscellaneous (Signed)
Summary: Orders Update  Clinical Lists Changes  Orders: Added new Test order of T-CBC w/Diff [phone]) - Signed Added new Test order of T-CD4SP [facility]) (CD4SP) - Signed Added new Test order of T-Comprehensive Metabolic Panel [phone]) - Signed Added new Test order of T-HIV Viral Load [phone]) - Signed     Process Orders Check Orders Results:     [facility]: ABN not required for this insurance Tests Sent for requisitioning (September 21, 2010 12:21 PM):     10/02/2010: [facility] -- T-CBC w/Diff [[id number]] (signed)     10/02/2010: [facility] -- T-Comprehensive Metabolic Panel [[id number]] (signed)     10/02/2010: [facility] -- T-HIV Viral Load [phone] (signed)

## 2011-01-29 NOTE — Progress Notes (Signed)
Summary: refill/mld w/note need labs & follow up appt  Phone Note Refill Request Message from:  Fax from Pharmacy on April 23, 2010 3:05 PM  Refills Requested: Medication #1:  COMBIVIR 150-300 MG TABS one tab by mouth two times a day [BMN]   Last Refilled: 03/12/2010 I tried to contact patient to let him know he needs to schedule a lab and follow up appt, but his phone has been disconnected. Pharmacy notified once a gain that patient needs to be seen. Has not been seen sicne April 2010   Method Requested: Fax to Local Pharmacy Next Appointment Scheduled: None scheduled    Prescriptions: COMBIVIR 150-300 MG TABS (LAMIVUDINE-ZIDOVUDINE) one tab by mouth two times a day  #60 Each x 1   Entered by:   Paulo Fruit  BS,CPht II,MPH   Authorized by:   Yisroel Ramming MD   Signed by:   Paulo Fruit  BS,CPht II,MPH on 04/23/2010   Method used:   Telephoned to ...       [facility]* (retail)       [address]       [place], [place]  [address]       Ph: [phone]       Fax: [phone]   RxID:   [id number]  Paulo Fruit  BS,CPht II,MPH  April 23, 2010 3:08 PM

## 2011-01-29 NOTE — Progress Notes (Signed)
Summary: refill request/mld  Phone Note Refill Request Message from:  Fax from Pharmacy  Refills Requested: Medication #1:  ACYCLOVIR 400 MG  TABS Take 1 tablet by mouth two times a day   Last Refilled: 03/30/2010 Last office visit was April 2010   Method Requested: Fax to Local Pharmacy Next Appointment Scheduled: None Scheduled  Initial call taken by: Paulo Fruit  BS,CPht II,MPH,  May 11, 2010 4:31 PM Caller: [name]* Reason for Call: Needs renewal  Follow-up for Phone Call        no refills until visit Follow-up by: Yisroel Ramming MD,  May 15, 2010 4:27 PM    Prescriptions: [name] 200-50 MG TABS (LOPINAVIR-RITONAVIR) two tabs by mouth two times a day  #0 x 0   Entered by:   Paulo Fruit  BS,CPht II,MPH   Authorized by:   Yisroel Ramming MD   Signed by:   Paulo Fruit  BS,CPht II,MPH on 05/24/2010   Method used:   Electronically to        [facility]* (retail)       [address]       [place], [place]  [address]       Ph: [phone]       Fax: [phone]   RxID:   [id number] ACYCLOVIR 400 MG  TABS (ACYCLOVIR) Take 1 tablet by mouth two times a day  #0 x 0   Entered by:   Paulo Fruit  BS,CPht II,MPH   Authorized by:   Yisroel Ramming MD   Signed by:   Paulo Fruit  BS,CPht II,MPH on 05/24/2010   Method used:   Electronically to        [facility]* (retail)       [address]       [place], [place]  [address]       Ph: [phone]       Fax: [phone]   RxID:   [id number]  Tried to contac patient to inform him he needs a visit.  The phone number we have on file has been disconnected. Paulo Fruit  BS,CPht II,MPH  May 24, 2010 12:15 PM

## 2011-02-04 ENCOUNTER — Ambulatory Visit: Payer: Self-pay | Admitting: Internal Medicine

## 2011-03-17 LAB — T-HELPER CELL (CD4) - (RCID CLINIC ONLY): CD4 T Cell Abs: 930 uL (ref 400–2700)

## 2011-04-09 ENCOUNTER — Other Ambulatory Visit: Payer: Self-pay | Admitting: Licensed Clinical Social Worker

## 2011-04-09 ENCOUNTER — Encounter: Payer: Self-pay | Admitting: Licensed Clinical Social Worker

## 2011-04-09 DIAGNOSIS — B20 Human immunodeficiency virus [HIV] disease: Secondary | ICD-10-CM

## 2011-04-09 MED ORDER — ACYCLOVIR 400 MG PO TABS: 400.0000 mg | ORAL_TABLET | Freq: Two times a day (BID) | ORAL | Status: DC

## 2011-04-10 LAB — T-HELPER CELL (CD4) - (RCID CLINIC ONLY): CD4 T Cell Abs: 750 uL (ref 400–2700)

## 2011-05-17 NOTE — Consult Note (Signed)
NAME:  [name], [name]                      ACCOUNT NO.:  [id number]   MEDICAL RECORD NO.:  [id number]                   PATIENT TYPE:  INP   LOCATION:  A201                                 FACILITY:  [facility]   PHYSICIAN:  Kofi A. Gerilyn Pilgrim, M.D.              DATE OF BIRTH:  1951/01/21   DATE OF CONSULTATION:  05/31/2004  DATE OF DISCHARGE:                                   CONSULTATION   NEUROLOGY CONSULTATION:   REASON FOR CONSULTATION:  New onset seizures.   IMPRESSION:  Semiology is consistent with a seizure.  Given his underlying  history this may be related to human immunodeficiency virus or alcohol  withdrawal.   RECOMMENDATION:  MRI of the brain with IV contrast, also an EEG.   HISTORY OF PRESENT ILLNESS:  The patient is a 60 year old African-American  gentleman with a known history of acquired immunodeficiency syndrome and  alcoholism.  The patient apparently was in a class to try to get his  driver's license again when he apparently passed out.  He was taken by EMS  to this hospital where he was noted to have two witnessed spells of what  appears to be generalized tonic clonic seizure, the patient has no  recollection of these events and essentially is amnestic to the event.  There are no reports of oral trauma or urinary incontinence.  The patient  does complain of muscle soreness, particularly involving the legs.  He also  reports some weakness in the legs since this event.  The patient apparently  was somewhat disoriented and confused though he has turned around and  appears to be back to baseline at this time.   PAST MEDICAL HISTORY:  Alcoholism and acquired immunodeficiency syndrome.   ADMISSION MEDICATIONS:  Septra and Viramune.   CURRENT MEDICATIONS:  Viramune, Septra, Dilantin, banana bag and Lamisil.   REVIEW OF SYSTEMS:  No headaches are reported.  Nurses report that patient  apparently had been agitated and confused requiring restraints but the  restraints have been discontinued as the confusion has resolved.   SOCIAL HISTORY:  Patient is single.  He does smoke tobacco and drinks  significantly, particularly on the weekend.  The patient reports consuming  his last alcoholic beverage Sunday before having this spell yesterday.  No  substance abuse reported.   PHYSICAL EXAMINATION:  VITAL SIGNS:  Temperature 98.4, pulse 94,  respirations 20, blood pressure 105/61.  GENERAL:  Thin tall very pleasant gentleman in no acute distress, does have  signs of chronic disease involving the skin and hair.  NECK:  Supple.  LUNGS:  Clear to auscultation bilaterally.  CARDIOVASCULAR:  Normal S1, S2.  ABDOMEN:  Soft.  EXTREMITIES:  No varicosities or edema.  NEUROLOGICAL:  Patient is awake, alert, he converses fluently, coherently  and is oriented, no dysarthria is seen, no aphasia is noted.  Cranial nerve  evaluation indicated cranial nerves II-XII are intact including visual  fields.  Motor examination shows mild weakness in the legs bilaterally  approximately in the area where he complains of muscle soreness,  particularly involving hip flexion.  The weakness is graded at 4+/5.  All  the muscle groups in the legs are normal.  Upper extremities normal.  There  is no pronator drift.  Coordination is intact.  Reflexes are slightly  diminished but are present.  Toes are both downgoing.  Sensory examination  normal to light touch and temperature.   LABORATORY EVALUATION:  Sodium 135, potassium 4.4, other chemistries are  unremarkable.  AST 52, ALT 59, alk phos 64, bili 0.1.  Phenytoin level  obtained this morning 13.9.  WBC 3.9, hemoglobin 10.4, platelet count 134,  MCV 92.  CT scan of the brain shows no acute process.   Thanks for this consultation.      ___________________________________________                                            Perlie Gold Gerilyn Pilgrim, M.D.   KAD/MEDQ  D:  05/31/2004  T:  05/31/2004  Job:  [id number]

## 2011-05-17 NOTE — Procedures (Signed)
NAME:  [name], [name] NO.:  [id number]   MEDICAL RECORD NO.:  [id number]                   PATIENT TYPE:  INP   LOCATION:  A204                                 FACILITY:  [facility]   PHYSICIAN:  Vida Roller, M.D.                DATE OF BIRTH:  1951/01/12   DATE OF PROCEDURE:  08/07/2004  DATE OF DISCHARGE:                                  ECHOCARDIOGRAM   TAPE NUMBER:  [id number]   TAPE COUNT:  [phone]   HISTORY:  A 60 year old man with an episode of syncope.  No previous  studies.   DESCRIPTION OF PROCEDURE:  The technical quality of the study is difficult.  M-mode tracing showed the aorta is 25 mm.   The left atrium is 30 mm.   The septum is 10 mm.   The posterior wall is 10 mm.   The left ventricular diastolic dimension is 38 mm.   The left ventricular systolic dimension is 29 mm.   2-D AND DOPPLER IMAGING:  The left ventricular is normal size with normal  systolic function.  Estimated ejection fraction 55-60%.  There are no  obvious wall motion abnormalities seen.  Diastolic function was not  assessed.   The right ventricle is normal size with normal systolic function.   Both atria appear to be normal size.   The aortic valve is mildly sclerotic but no evidence of stenosis or  regurgitation.   The mitral valve has mild thickening of the anterior leaflet but there is no  evidence of stenosis or regurgitation.   The tricuspid valve is not well seen but does not appear to have any  significant thickening.  Mild regurgitation.   The pulmonic valve is not well seen.   The inferior vena cava appears to be of normal size.   The ascending aorta is not well seen.   The pericardial structures are normal.      ___________________________________________                                            Vida Roller, M.D.   JH/MEDQ  D:  08/07/2004  T:  08/07/2004  Job:  [id number]

## 2011-05-17 NOTE — H&P (Signed)
NAME:  [name], [name] NO.:  [id number]   MEDICAL RECORD NO.:  [id number]                   PATIENT TYPE:  INP   LOCATION:  A208                                 FACILITY:  [facility]   PHYSICIAN:  Michaelyn Barter, M.D.              DATE OF BIRTH:  Sep 27, 1951   DATE OF ADMISSION:  07/14/2004  DATE OF DISCHARGE:                                HISTORY & PHYSICAL   (CORRECTED COPY)   CHIEF COMPLAINT:  Bilateral leg weakness.   HISTORY OF PRESENT ILLNESS:  The patient is a 60 year old male with a  history of HIV, who was discharged yesterday secondary to being evaluated  for dizziness.  The patient was found to be anemic on admission; at that  time, he received 2 units of packed RBCs.  The next day, he stated his  dizziness had completely resolved.  He also complained of some bilateral  numbness in both of his legs during the admission interview, however, he  later stated that this too had improved over the course of his  hospitalization.  Today, he states that around 7 p.m. he took his  medications.  Shortly afterwards, he states that both of his legs felt numb.  He said he got up to go to the bathroom and stumbled back to the bed  secondary to this numbness.  He states that he felt fine prior to taking his  medication.  He goes on to state that around 9 p.m. he developed bilateral  cramps in both of his legs which lasted for approximately 1-2 hours.  Subsequently, he developed an inability to move both of his legs shortly  after the cramps started.  The patient's brother stated that he had similar  inability to move his legs approximately 1 month ago, at which time he had  experienced a seizure, however, today, the patient denied having experienced  any seizure-like activity; no loss of consciousness.   PAST MEDICAL HISTORY:  1. HIV diagnosed in 1996.  2. Pneumonia treated approximately 1 month ago.  3. Seizure disorder.   PAST SURGICAL HISTORY:  1.  Appendectomy.   ALLERGIES:  None.   FAMILY HISTORY:  Mother -- unknown.  Father died from CVA.   HOME MEDICATIONS:  1. Delavirdine 100 mg 4 tabs t.i.d.  2. Nevirapine 200 mg 1 tab p.o. b.i.d..  3. Fluconazole 100 mg 2 tabs p.o. daily.  4. Nelfinavir 250 mg 5 tabs p.o. b.i.d.  5. Phenytoin extended release 450 mg 2 tabs p.o. b.i.d.  6. Bactrim double strength 1 tab p.o. daily.  7. Ascorbic acid 500 mg 1 tab p.o. daily.  8. Efavirenz 200 mg 3 tabs  one time a day.  9. Azithromycin 600 mg 2 tabs p.o. on Thursday.  10.      Thiamine 100 mg p.o. daily.   SOCIAL HISTORY:  The patient is homeless.  He was discharged to a nursing  home yesterday.  Cigarettes:  Positive -- 1-2 packs per day.  Alcohol:  Previous history of abuse up until 4 years ago; patient now drinks only on  weekends.  Cocaine:  Positive -- last used at age 10.   REVIEW OF SYSTEMS:  Review of systems as per HPI, otherwise, all other  systems negative.  Positive diarrhea.  No urinary or bowel incontinence.   PHYSICAL EXAM:  GENERAL:  No obvious distress.  HEENT:  Anicteric.  Extraocular movements intact.  Poor dentition, multiple  teeth removed.  No thrush present within the oral cavity.  NECK:  Neck supple.  Positive bilateral slightly enlarged lymph nodes within  the supraclavicular region.  CARDIAC:  S1 and S2 positive, regular rate and rhythm.  No S3, no S4, no  murmurs.  RESPIRATORY:  Clear bilaterally.  No crackles.  ABDOMEN:  Abdomen flat, soft, nontender and nondistended.  Positive bowel  sounds.  EXTREMITIES:  Upper extremity strength 5/5.  The patient has bilateral  decreased grip strength, 0/0 lower extremity strength.  Patient cannot move  either leg against or with gravity.  The patient has decreased sensation to  pinprick over both anterior thigh surfaces.  Sensation is strong over the  medial thigh surfaces bilaterally.  Strong sensation to pinprick over the  lateral aspect of the right thigh;  decreased sensation to pinprick over the  left lateral thigh; the patient had stronger pinprick sensation below the  knees down to both feet bilaterally.  He has decreased ability to wiggle  toes on both feet.  Babinski is negative bilaterally.  Negative Homans sign  bilaterally.  Normal position sense when the big toe is moved.  Absent  reflexes in both legs bilaterally at patellae and at the ankles.  Approximately 1+ bilateral biceps reflexes.  No calf tenderness to palpation  bilaterally.  NEUROLOGIC:  Neurologically, cranial nerves II-XII intact.  Good gag reflex.  The patient is alert and oriented x3.   LABORATORIES:  Hemoglobin 11.1, hematocrit 32.6, white blood cells 2.9,  platelets 158,000.  BUN 6, creatinine 0.7.  Drug screen negative.  Urinalysis negative.  CD4 count less than 10 on June 21, 2004.  SGOT 54,  SGPT 83.   CT of the head was completed, which was read as negative.   ASSESSMENT AND PLAN:  1. Bilateral leg weakness/immobility, new onset, etiology unknown.     Differential includes acquired immunodeficiency syndrome/human     immunodeficiency virus-related neuropathy, medication side-effect,     electrolyte abnormality.  We will check patient's electrolytes including     phosphorus and magnesium.  We will get neurology consult for further     evaluation.  We will order Duplex of both lower extremities to rule out     presence of deep venous thrombosis; this is not very likely, however, the     patient did complain of pain in both legs prior to the onset of the     weakness.  We will discuss possibility of Guillain-Barre syndrome with     neurology.  We will check RPR, VDRL, TSH, folate, B12, neurological     checks q.6 h.  2. Human immunodeficiency virus/acquired immunodeficiency syndrome.  The     patient's last CD4 was less than 10 on June 21, 2004.  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  We will    repeat a CD4 count.   We will restart his antiretroviral medications for     now.  3. Seizure  disorder.  The patient is currently seizure-free.  We will     continue previously prescribed medications.  We will perform neurological     checks.  4. Diarrhea.  We will send stool studies for ova, parasites, C. difficile,     Shigella, Salmonella.    ___________________________________________                                         Michaelyn Barter, M.D.   OR/MEDQ  D:  07/14/2004  T:  07/14/2004  Job:  [id number]

## 2011-05-17 NOTE — H&P (Signed)
NAME:  [name], [name]                      ACCOUNT NO.:  [id number]   MEDICAL RECORD NO.:  [id number]                   PATIENT TYPE:  INP   LOCATION:  A201                                 FACILITY:  [facility]   PHYSICIAN:  Tesfaye D. Felecia Shelling, M.D.              DATE OF BIRTH:  February 10, 1951   DATE OF ADMISSION:  05/30/2004  DATE OF DISCHARGE:                                HISTORY & PHYSICAL   CHIEF COMPLAINT:  Confusion and disorientation.   HISTORY OF PRESENT ILLNESS:  This is a 60 year old male patient with a known  case of acquired immunodeficiency syndrome who came to the emergency room  with the above complaint.  Patient was seen this morning and evaluated in  the emergency room, and he was sent back home.  The patient claimed he used  to drink alcohol regularly; however, he did not drink in the last 24 hours.  According to the family members, he had convulsions at home.  He was brought  back to the emergency room, and he was re-evaluated.  While the patient was  being evaluated in the emergency room, he had two episodes of seizures.  The  patient was given a loading dose of Dilantin and Ativan.  He had a CT scan  of the head and routine labs, which were normal.  The patient is being  admitted for further treatment.   REVIEW OF SYSTEMS:  Patient is currently postictal and unable to communicate  very well.   PAST MEDICAL HISTORY:  1. Acquired immunodeficiency syndrome, for which he is being followed in an     infectious disease clinic in [place].  2. History of alcohol abuse.   CURRENT MEDICATIONS:  1. Septra DS 1 tablet p.o. q.d.  2. Viramune 250 mg p.o. b.i.d.   SOCIAL HISTORY:  Patient is single.  He has a history of tobacco and  alcohol.  No history of substance abuse.   PHYSICAL EXAMINATION:  VITAL SIGNS:  Blood pressure 112/74, pulse 103,  respiratory rate 20, temperature 97.5 degrees Fahrenheit.  GENERAL:  Patient is postictal.  He is not responding to verbal  communication; however, he responds to painful stimuli.  HEENT:  Pupils are equal and reactive.  NECK:  Supple.  LUNGS:  Decreased air entry.  A few rhonchi.  HEART:  First and second heart sounds heard.  No murmur or gallop.  ABDOMEN:  Soft and lax.  Bowel sounds are positive.  No mass.  No  organomegaly.  EXTREMITIES:  No leg edema.   LABS ON ADMISSION:  Sodium 135, potassium 4.5, chloride 106, carbon dioxide  25, glucose 93, BUN 12, creatinine 0.8, calcium 9.1.  CBC:  WBC 3.9,  hemoglobin 10.4, hematocrit 31.1, platelets 134.   ASSESSMENT:  1. New seizure disorder, probably could be secondary to alcohol withdrawal     syndrome.  2. Acquired immunodeficiency syndrome.  3. History of alcohol abuse.  4. Anemia.  PLAN:  Will admit the patient under telemetry.  Will do neuro checks.  Will  continue the patient on Dilantin.  Will continue alcohol withdrawal  treatment according to protocol.  Will continue the patient on his regular  medications.     ___________________________________________                                         Eustaquio Maize Felecia Shelling, M.D.   TDF/MEDQ  D:  05/30/2004  T:  05/31/2004  Job:  [id number]

## 2011-05-17 NOTE — Group Therapy Note (Signed)
NAME:  [name], [name]                      ACCOUNT NO.:  [id number]   MEDICAL RECORD NO.:  [id number]                   PATIENT TYPE:  INP   LOCATION:  A326                                 FACILITY:  [facility]   PHYSICIAN:  Kofi A. Gerilyn Pilgrim, M.D.              DATE OF BIRTH:  05-Jan-1951   DATE OF PROCEDURE:  07/20/2004  DATE OF DISCHARGE:  07/20/2004                                   PROGRESS NOTE   SUBJECTIVE:  The patient reports doing better today regarding his strength  in his legs and in fact he has ambulated with some assistance in the room.  He reports feeling weak and not quite well today, exactly why he could not  really state but he believes he needs to be back on his HAART medications.   OBJECTIVE:  He actually has improved strength in the legs 4+/5 in both legs,  tone remains increased but less so than on admission, reflexes are brisk  left side more than the right side.  His workup imaging of the cervical and  thoracic spine shows no intrinsic cord lesions, there is some atrophy of the  cord in the cervical region.  There is also some degenerative changes noted  and a thoracic but no cord compression.  Blood work:  RPR nonreactive, CPK  35, TSH 1.9, Free T4 is 5.1, homocysteine level 12.6, the spinal fluid  analysis wbc 0, rbc 34, glucose 60, protein 36, the fluid is recorded as  clear and colorless, cryptococcal antigen is pending, the other CSF analyses  such as HSV, PCR, and CMVP are pending.   ASSESSMENT AND PLAN:  Melton Krebs who has myelopathy and on physical  examination he seems to have improved, possibly related to holding of his  highly active antiretroviral therapy medications.  So far there does not  appear to be evidence of opportunistic infection to explain the myelopathy.  Certainly there is no compressive lesion noted.  From a neurological  standpoint, he is okay for discharge.  He should be followed up with his  infectious disease doctor and with [place] in the  clinic.      ___________________________________________                                            Perlie Gold. Gerilyn Pilgrim, M.D.   KAD/MEDQ  D:  07/20/2004  T:  07/20/2004  Job:  [id number]

## 2011-05-17 NOTE — Consult Note (Signed)
NAME:  [name], [name] NO.:  [id number]   MEDICAL RECORD NO.:  [id number]                   PATIENT TYPE:  INP   LOCATION:  A204                                 FACILITY:  [facility]   PHYSICIAN:  Vida Roller, M.D.                DATE OF BIRTH:  1951-07-26   DATE OF CONSULTATION:  08/07/2004  DATE OF DISCHARGE:                                   CONSULTATION   PRIMARY CARE PHYSICIAN:  Illene Labrador, M.D. at the ID Clinic in [facility] in [place].   REASON FOR CONSULTATION:  Syncope.   HISTORY OF PRESENT ILLNESS:  [name] is a 60 year old, African  American man who has acquired immune deficiency syndrome, history of a  seizure disorder, who also has a history of a lower extremity neuropathy,  who appeared to have an episode outside the hospital which involved what was  described as a syncopal episode but was without any significant loss of  consciousness.  The patient is a difficult historian due to pressured speech  and altered sensorium but appears to have had an episode where he presented  for evaluation to the social work area and was complaining of some  difficulties with his assisted-living facility.  When he was unable to make  an appointment with the social workers, he had an episode which required him  to be evaluated in the emergency department.  There appears to be no  documented loss of consciousness but he was found to be mildly febrile and  was admitted to the hospital for further evaluation.  He reports no feelings  of palpitations, dizziness, or shortness of breath.  He says that he just  suddenly felt as though he could not stand and fell but denies any loss of  consciousness.  There was no bowel or bladder incontinence.  He did not  injure himself.  There was no seizure activity.   PAST MEDICAL HISTORY:  1. HIV and AIDS.  He has a very low CD4 count, less than 10, which was     measured in July.  2. He has a history  of a myopathy, which is unclear and thought to be     related to his antiviral medication.  He is undergoing evaluation for     this.  He is homeless.  3. He has a history of ongoing tobacco abuse.  4. History  of cocaine abuse.  5. Seizure disorder, which is not well described nor entirely well     attributed to any particular issue.  6. History of seborrheic eczema.   MEDICATIONS UPON PRESENTATION:  1. Bactrim DS 1 daily.  2. Diflucan 200 mg daily.  3. Zithromax 1200 mg on Thursday.  4. Dilantin 200 mg twice a day with an extra 100 mg on Monday, Wednesday,     and Friday.  5. Vitamin C and thiamine on a regular basis.   ALLERGIES:  He is not allergic to any medications.   SOCIAL HISTORY:  He is homeless.  He lives in an assisted living facility.  He smokes 1-2 packs of cigarettes a day.  He has a previous history of  alcohol abuse, which is felt to be somewhat involved in his seizure  activity.  He says he has not consumed any alcohol in 4 days.  He has a  history of cocaine abuse in the past and he is uncertain as to how he  contracted HIV.   FAMILY HISTORY:  His father died of a CVA.  His mother is still alive and he  used to live with her.  He is not sure about any other family members.   REVIEW OF SYSTEMS:  Generally negative except for that reviewed in the  history of present illness.   PHYSICAL EXAMINATION:  GENERAL:  He is a cachectic-appearing black male in  no apparent distress who is very difficult to understand, as he speaks with  very pressured rapid speech.  He also is very mildly hyperkinetic and has a  significant and severe flight of ideas.  He is able to be directed but tends  to be very tangential.  VITAL SIGNS:  Temperature is 100.5, his blood pressure is 111/75, his pulse  is about 100, and his respirations are 20.  HEENT:  Significant for severely poor dentition with what appear to be  multiple dental caries.  NECK:  Supple.  He has no jugular venous  distention or carotid bruits.  His  thyroid appears to be normal size in the midline.  He does have diffuse  lymphadenopathy.  CHEST:  Clear to auscultation bilaterally.  CARDIOVASCULAR:  Regular.  There are no obvious murmurs.  ABDOMEN:  Soft, nontender, with normal active bowel sounds.  EXTREMITIES:  Quite emaciated.  He does have 2+ pulses bilaterally.  NEUROLOGIC:  Generally nonfocal.   LABORATORY DATA:  His white blood cell count is 3.8 with an H&H of 10 and  30, platelet count of 141,000.  His sodium is 136, potassium 4.5, chloride  104, bicarbonate 26, BUN of 15, creatinine of 0.8.  His blood sugar is 92.  His 3 sets of cardiac enzymes all showing normal CKs and CK-MBs, but have  troponins that range from 0.39-0.26.  His point-of-care enzymes were normal  except for a troponin of 0.21.  His urinalysis was also negative.  There has  been no urine drug screen performed.  He had a chest x-ray which showed no  acute abnormalities.  He had a head CT which showed no acute intracranial  abnormalities.  His electrocardiogram shows sinus rhythm at a rate of 94  with a left anterior fascicular block, nonspecific ST-T wave changes, mild  left axis deviation at -45, and normal intervals.  No acute ischemic ST-T  wave changes and no Q waves.   IMPRESSION:  We have a gentleman who has an episode of loss of lower  extremity strength, it appears, who did not have a syncopal episode, who  does have abnormal cardiac enzymes.  It is unclear to me the etiology of  these.   RECOMMENDATIONS:  He receive an echocardiogram.  If there is concern for  this actually being an episode of syncope, then he probably deserves carotid  Dopplers.  He needs a urine drug screen and a Dilantin level drawn and I  think he needs a psychiatric evaluation to make sure that he is not acutely  manic.  Beyond  that, it does not appear to me that there is any clear obvious cardiac pathology at this point.  We will know more  once we get the  echocardiogram completed.      ___________________________________________                                            Vida Roller, M.D.   JH/MEDQ  D:  08/07/2004  T:  08/07/2004  Job:  [id number]

## 2011-05-17 NOTE — Discharge Summary (Signed)
NAME:  [name], [name] NO.:  [id number]   MEDICAL RECORD NO.:  [id number]                   PATIENT TYPE:  INP   LOCATION:  A204                                 FACILITY:  [facility]   PHYSICIAN:  Vania Rea, M.D.              DATE OF BIRTH:  1951/09/08   DATE OF ADMISSION:  08/06/2004  DATE OF DISCHARGE:  08/08/2004                                 DISCHARGE SUMMARY   DISCHARGE DIAGNOSES:  1. Syncope secondary to #2, resolved.  2. Dehydration, resolved.  3. 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  4. History of myelopathy presumed to be side-effect of HAART medication.  5. Failure to thrive.  6. Seborrheic eczema.  7. Seizure disorder.   DISPOSITION:  Discharged to assisted living facility.   CONDITION ON DISCHARGE:  Stable.   DISCHARGE MEDICATIONS:  1. Bactrim DS one daily.  2. Zithromax 1200 mg on Thursdays.  3. Diflucan 200 mg daily.  4. Dilantin 200 mg b.i.d.; 100 mg on Monday, Wednesday, and Friday.  5. Vitamin C 500 mg daily.  6. Thiamine 100 mg daily.   HOSPITAL COURSE:  Please refer to history and physical of August 8th. This  is an [place] American gentleman well known to the service with previous  admissions for seizure disorders and then myelopathy. Some confusion about  his HIV medication, but in consultation with physicians at the HIV clinic in  [place] on previous occasion they have advised that we discontinue his  HAART medication since this is a probable cause of his myelopathy. These  medications are discontinued and the patient improved considerably and was  discharged to an assisted living facility. On the day of admission the  patient walked many miles from the assisted living facility in the hot sun  to see the social worker at [facility] to complain about conditions at the  facility. On arrival at the emergency room at [facility] the  patient had a syncopal episode. He sorted out his problems with the  social  worker, but then was found to have a low-grade fever of 100.5 and to be  mildly dehydrated. The patient was admitted for workup of his syncope. He  was hydrated, had the benefit of a cardiology evaluation, and although his  troponins were slightly elevated the cardiology service felt he had no acute  cardiac problem contributing to his syncope. The patient is discharged in  much improved condition, has been given a home in a new assisted living care  facility. The patient has been advised to follow up with Dr. Illene Labrador on  Tuesday, August 14, 2004, at [facility] infectious disease.   SPECIAL INSTRUCTIONS:  The patient has been advised that it is very  important for him to be at that clinic and to call the hospitalist service  at [phone] if he has any problems before then.     ___________________________________________  Vania Rea, M.D.   LC/MEDQ  D:  08/09/2004  T:  08/09/2004  Job:  [id number]

## 2011-05-17 NOTE — Discharge Summary (Signed)
NAME:  [name], [name] NO.:  [id number]   MEDICAL RECORD NO.:  [id number]                   PATIENT TYPE:  INP   LOCATION:  A326                                 FACILITY:  [facility]   PHYSICIAN:  Vania Rea, M.D.              DATE OF BIRTH:  Sep 13, 1951   DATE OF ADMISSION:  07/13/2004  DATE OF DISCHARGE:  07/20/2004                                 DISCHARGE SUMMARY   PRIMARY CARE PHYSICIAN:  Dr. Harrie Jeans, ID clinic in [place].   DISCHARGE DIAGNOSES:  1. Myelopathy of idiopathic etiology.  2. Acquired immunodeficiency syndrome with CD4 count less than 10.  3. Seizure disorder with therapeutic Dilantin levels.  4. Failure to thrive.  5. Seborrheic eczema.  6. Skin lesions of unclear etiology.  7. Hypomagnesemia, resolved.   DISPOSITION:  Discharge to home.   DISCHARGE CONDITION:  Stable.   DISCHARGE MEDICATIONS:  1. Bactrim Double Strength 1 daily.  2. Diflucan 200 mg daily.  3. Zithromax 1200 mg on Thursdays.  4. Dilantin 200 mg twice daily with an extra 100 mg Monday, Wednesday, and     Friday.  5. Vitamin C 500 mg daily.  6. Thiamine 100 mg daily.  7. Magnesium oxide 400 mg twice daily x3 days.   HOSPITAL COURSE:  Please refer to the history and physical of July 16th.  This is a gentleman with known AIDS admitted here about a month or two ago  with a diagnosis of pneumonia and seizure disorder, who was started on  Dilantin at that time.  The patient represented with lower extremity  weakness.  The patient was admitted and observed and found to be weak  sometimes and not weak at other times.  It was unclear at first whether the  patient was malingering, but physical exam revealed significant increased  tone in the patient's lower extremity.  A telephone consult was sought with  the infectious disease attending on call at [facility].  When he heard the  heart medication the patient was on, which included Viramune, Viracept,  Rescriptor, and Sustiva, he felt the patient's symptoms could possibly be  due to his heart therapy.  There was some incompatibilities among the  medications.  Recommend discontinuing the heart medications completely and  having the patient follow up at the ID clinic.  The patient's heart  medications were discontinued; however, the patient's apparent myopathy  persisted.  A neurologist consult was sought, and thorough neurological exam  by Dr. Gerilyn Pilgrim revealed evidence of a myelopathy.  MRI of the cervical and  thoracic spine were done, but they revealed no evidence of acute problems  that could be causing this myelopathy.  Patient has improved somewhat today  and is able to ambulate around his room, although his extremities remain  stiff.  Therefore, the plan is to discharge the patient back to the rest  home today for continued workup as an outpatient.  The patient  is being  advised to get an appointment to see Dr. Harrie Jeans within one week and to  follow up with Dr. Gerilyn Pilgrim.  Dr. Gerilyn Pilgrim will see him today before he  leaves the hospital.   Today, the patient had an upset stomach earlier this morning but feels well  now and is looking forward to going home.  His temperature is 98.5, pulse  95, respirations 20, blood pressure 105/65.  His mucous membranes are pink.  Chest is clear to auscultation.  His heart had a regular rhythm.  His  abdomen is soft and nontender.   His labs this morning:  His sodium was 135, potassium 4.4, chloride 102, CO2  25, BUN 13, creatinine 0.8, calcium 9.3, glucose 97.  His white count is  3.3, which is higher than it has been in a long time.  His hematocrit is  33.6.  He received 2 units a few weeks ago.  His platelet count is 139.  He  does sometimes carry a history of pancytopenia.  His magnesium is 1.7  currently.  It was 1.4 when he was admitted.  He has had several rounds of  potassium.  His phenytoin is 15.3.  His RPR and crypto labs were  negative.  His CSF was essentially normal.   FOLLOW UP:  Follow up with Dr. Harrie Jeans in a week.  Follow up with Dr.  Gerilyn Pilgrim for myelopathy.     ___________________________________________                                         Vania Rea, M.D.   LC/MEDQ  D:  07/20/2004  T:  07/20/2004  Job:  [id number]

## 2011-05-17 NOTE — H&P (Signed)
NAME:  [name], ZUFALL NO.:  [id number]   MEDICAL RECORD NO.:  [id number]                  PATIENT TYPE:  PINP   LOCATION:  332                                  FACILITY:   PHYSICIAN:  Michaelyn Barter, M.D.              DATE OF BIRTH:   DATE OF ADMISSION:  DATE OF DISCHARGE:                                HISTORY & PHYSICAL   CHIEF COMPLAINT:  1. Dizziness.  2. Bilateral numbness in both lower legs.   HISTORY OF PRESENT ILLNESS:  The patient is a 60 year old male with HIV, who  states that he recently has begun to develop dizziness after taking his  medications. He does not know which medication triggers his dizziness.  He  denies feeling dizzy prior to taking his medications.  He does not know  whether he has had a recent adjustment to his medication.  Two of his  caregivers are present.  One states that the patient was recently released  from [facility] on the previous Friday.  He had been taken there  secondary to having suicidal ideation.  The caregiver stated that the  patient was fine when he was released, however, she stated he developed low-  grade fevers on Saturday and Sunday nights.  Both nights his temperature  maximum was 99.7 degrees.  The patient currently denies having nausea or  emesis.  The caregiver said that the patient appeared to be weak Sunday  night and this morning prior to admission.  The patient now resides at home  away from home.  He states that he had one episode of diarrhea earlier in  the morning.  No dysuria.  No obvious hematuria.  No cough.  No shortness of  breath.  Positive damp shirt in the morning.   PAST MEDICAL HISTORY:  1. HIV diagnosed in 1996.  Route of infection:  The patient says he does not     know.  2. Pneumonia treated approximately one month ago.  3. Seizure disorder.   PAST SURGICAL HISTORY:  Appendectomy.   ALLERGIES:  No known drug allergies.   FAMILY HISTORY:  Mother:  The  patient cannot recall any illnesses.  Father:  Died from CVA.   HOME MEDICATIONS:  1. Delavirdine 100 mg.  The patient takes four tablets t.i.d.  2. Nevirapine 200 mg one tablet p.o. b.i.d.  3. Fluconazole 100 mg two tablets p.o. daily.  4. Nelfinavir 250 mg five tablets p.o. b.i.d.  5. Phenytoin Extended Release 450 mg tablet two tablets b.i.d.  6. Bactrim Double Strength one tablet p.o. daily.  7. Ascorbic acid 500 mg one tablet p.o. daily.  8. Efavirenz 600 mg three tablets p.o. daily.  9. Azithromycin 600 mg two tablets p.o. every Thursday.  10.      Thiamine 100 mg p.o. daily.   SOCIAL HISTORY:  The patient previously resided with his mother.  He was  taken to [facility] secondary  to suicidal thoughts.  He was discharged  from there to home away from home on Friday.   HABITS:  1. Cigarettes:  The patient smokes one to two packs per day since the age of     77.  2. Alcohol:  The patient was an alcoholic up until four to five years ago.     He states he currently drinks only on the weekends.  3. Positive cocaine use, last use at age 34.   PHYSICAL EXAMINATION:  GENERAL APPEARANCE:  The patient looks comfortable.  There are no visible signs of distress.  He is cooperative.  VITAL SIGNS:  His temperature is 99.5 degrees, blood pressure 94/65, heart  rate 91, and respirations 20.  HEENT:  Anicteric.  Extraocular movements are intact.  NECK:  Supple.  No nuchal rigidity.  Positive multiple palpable lymph nodes  bilaterally in the supraclavicular region.  CARDIAC:  S1 and S2 positive.  Regular rate and rhythm.  No S3.  No S4.  RESPIRATORY:  Clear bilaterally.  No crackles.  No wheezes.  ABDOMEN:  Flat, soft, nontender, and nondistended.  Positive bowel sounds.  No organomegaly.  EXTREMITIES:  No leg edema.  MUSCULOSKELETAL:  5/5 upper and lower extremity strength.  NEUROLOGIC:  Cranial nerves II-XII are intact.   LABORATORIES:  CBC:  Hemoglobin 8.8, hematocrit 25.5, white  count 1.9,  platelets 140, MCV 92.0.  Alkaline phosphatase 87, bilirubin total 0.4,  bilirubin direct less than 0.1.  Sodium 138, potassium 4.6, chloride 104,  CO2 27, BUN 12, creatinine 0.7, glucose 90, calcium 10.2.  Dilantin level  13.6.  Urinalysis:  Ketones negative, nitrites negative, leukocyte esterase  negative.   ASSESSMENT AND PLAN:  1. Dizziness, new onset.  Differential for dizziness includes anemia, volume     status, medications, or HIV.  Will evaluate each of the above.  Will     check EKG to rule out arrhythmia as a contributing factor.  2. Anemia.  Most likely secondary to HIV.  The patient reports no obvious     bleeding.  Will transfuse two units of packed red blood cells.  Will     repeat CBC post transfusion.  3. Volume status.  The patient is hypotensive by blood pressure.  Will check     orthostatics.  Will provide fluid hydration at 0.9 normal saline at 80     mL/hr.  4. Medications.  Will review medications closely.  The patient is vague     about any recent changes.  There may, however, be some contribution from     his medications.  Will monitor for now.  5. Human immunodeficiency virus.  The dizziness may be related to this.     Will monitor for now.  6. Homelessness and lack of primary care physician.  Will consult social     work for help with discharge plans.  7. Seizure disorder.  The patient is currently seizure-free.  Will continue     previously prescribed medication.  Will monitor dilantin level.     ___________________________________________                                         Michaelyn Barter, M.D.   OR/MEDQ  D:  07/11/2004  T:  07/11/2004  Job:  [id number]

## 2011-05-17 NOTE — H&P (Signed)
NAME:  [name], [name] NO.:  [id number]   MEDICAL RECORD NO.:  [id number]                   PATIENT TYPE:  INP   LOCATION:  A204                                 FACILITY:  [facility]   PHYSICIAN:  Vania Rea, M.D.              DATE OF BIRTH:  [birth year]   DATE OF ADMISSION:  08/06/2004  DATE OF DISCHARGE:                                HISTORY & PHYSICAL   PRIMARY CARE PHYSICIAN:  Dr. Illene Labrador, ID Clinic in [place].   CHIEF COMPLAINT:  Syncope.   HISTORY OF PRESENT ILLNESS:  The patient is a 60 year old man with acquired  immunodeficiency syndrome, seizure disorder who was recently discharged from  here to an assisted living facility.  The patient's HAART med's were  discontinued and he was advised to discontinue his HAART medications as it  could be a cause of myelopathy that he was having.  The patient has  apparently been doing well.  He has been having no difficulty walking since  discharge. He has not been to see the doctors yet and came to the emergency  room today to see the social workers because he was complaining about the  assisted living facility where he was staying that they are not making his  appointments.  However, the patient had a syncopal episode at the front of  the hospital.  There was no significant loss of consciousness but on  evaluation he was noted to have a temperature of 100.5. Point of care  enzymes were done and he was found to have a normal troponin.  The patient  is being admitted and investigated for this syncopal episode.   PAST MEDICAL HISTORY:  HIV/AIDS with CD4 count less than 10 in July.  Myelopathy of unclear etiology, possible medication related.  Failure to  thrive.  Seborrheic eczema.  Metabolic disturbance in previous admissions.  Seizure disorder.   MEDICATIONS:  1. Bactrim DS one daily.  2. Diflucan 200 mg daily.  3. Zithromax 1200 mg on Thursday's.  4. Dilantin 200 mg twice daily with an extra  100 mg on Monday, Wednesday and     Friday.  5. Vitamin C 500 mg daily.  6. Thiamine 100 mg daily.   ALLERGIES:  No known drug allergies.   SOCIAL HISTORY:  The patient was homeless but now lives in an assisted  living facility.  Smokes one to two packs of cigarettes per day.  Previous  history of alcohol abuse, discontinued four days ago and last used cocaine  as a very young man.  Does not know how he contracted the acquired  immunodeficiency syndrome.   FAMILY HISTORY:  Father died of a cerebrovascular accident.  Patient does  not get along well with most of his family and does not know much about  their illnesses.   REVIEW OF SYMPTOMS:  Denies headaches.  Says he sometimes has problems with  his  vision.  No thyroid  problems.  No chest pain, no shortness of breath,  no nausea, vomiting, diarrhea or constipation. Has not been having any lower  extremity pain or numbness.  Has not been having any difficulty walking.   PHYSICAL EXAMINATION:  GENERAL:  This is a middle-aged emaciated black man  lying in bed.  VITAL SIGNS:  Temperature 100.5, blood pressure 111/75, pulse 109,  respirations 20.  HEENT:  Pupils equal, round, reactive to light. He has diffuse seborrheic  eczema of the scalp, face.  He has no oral Candida.  His mucous membranes  are mildly dehydrated.  NECK:  He has no thyromegaly.  CHEST:  Clear to auscultation bilaterally.  ABDOMEN:  Soft and nontender.  EXTREMITIES:  Wasted.  He has no edema.  He has 2+ pulses bilaterally.  CNS:  He has no obvious focal neurological deficits.  He ambulates as to be  expected for an emaciated person with no drift or ataxia.   LABORATORY DATA:  White blood cell count is 4.8 which is higher than it  usually is, his hematocrit is 33.7, platelet count 168,000.  His eosinophil  count is 17%, absolute eosinophil count is elevated at 8.  Urinalysis is  unremarkable.  CT scan of the head reveals no acute intracranial  abnormalities.  His  point of care enzymes reveal a myoglobin of 82, troponin  of 0.21, CK-MB 1.2, CK total is not seen.   ASSESSMENT:  Syncopal episode with light fever in gentleman who is  immunocompromised.  We will admit this gentleman to the third floor.  Will  get chest x-ray, blood cultures and 2-dimensional echocardiogram.  There is  a possibility he may be having an endocarditis. He may simply be dehydrated.  He is a little dehydrated.  It may be a progression of his human  immunodeficiency virus disease.  Will re-evaluate as more information  becomes available.     ___________________________________________                                         Vania Rea, M.D.   LC/MEDQ  D:  08/06/2004  T:  08/06/2004  Job:  [id number]

## 2011-05-17 NOTE — Discharge Summary (Signed)
NAME:  [name], RILING NO.:  [id number]   MEDICAL RECORD NO.:  [id number]                   PATIENT TYPE:  INP   LOCATION:  A201                                 FACILITY:  [facility]   PHYSICIAN:  Vania Rea, M.D.              DATE OF BIRTH:  Feb 08, 1951   DATE OF ADMISSION:  05/30/2004  DATE OF DISCHARGE:  06/06/2004                                 DISCHARGE SUMMARY   PRIMARY CARE PHYSICIAN:  Dr. Harrie Jeans, [facility].   DISCHARGE DIAGNOSES:  1. New onset seizures, being treated.  2. Acquired immune deficiency syndrome.  3. Pancytopenia.  4. Bilobar pneumonia.  5. Tobacco abuse.  6. Alcohol abuse.  7. Chronic obstructive pulmonary disease.  8. Seborrheic dermatitis.   DISPOSITION:  Discharge to home.   DISCHARGE CONDITION:  Stable.   DISCHARGE MEDICATIONS:  1. Zithromax 500 mg daily for 10 days.  2. Diflucan 200 mg daily.  3. Bactrim double strength, one tablet daily.  4. Risperdal two tablets three days daily.  5. Sustiva 600 mg daily.  6. Viramune 200 mg twice daily.  7. Dilantin 250 mg twice daily.  8. Multivitamin one daily.  9. Thiamine 100 mg daily.  10.      Hydrochlorothiazide 1% cream applied three times daily.  11.      Coal Tar shampoo, 12.5%, use daily.   HOSPITAL COURSE:  Please refer to history and physical of June 1.  This is a  middle-aged [place] American gentleman with a diagnosis of HIV infection who  was admitted with new onset seizures in a postictal phase, and a history of  alcohol abuse.  The patient was put on alcohol withdrawal protocol after  being loaded with Ativan and Dilantin in the emergency room.  However, on  the ward, the patient has the benefit of a neurology consultation with Dr.  Darleen Crocker A. [place], and it stated that patient only drinks alcohol on the  weekends.  All and all it seems these seizures are due to neither alcohol or  alcohol withdrawal.  An MRI of the brain  revealed no evidence of acute  intracranial abnormalities.  An EEG report is still pending.  She decision  was taken for continuing the patient with full seizure prophylaxis with  Dilantin.   On the standard dose of Dilantin, 300 mg daily, the patient's Dilantin level  remained persistently subtherapeutic up around 5.  After reloading with  Dilantin and resuming at the dose of 200 mg twice daily, the patient's  Dilantin level this morning was 9.4.  The patient's Dilantin dose is  therefore being increased to 250 mg p.o. twice daily.   The patient's discharge was delayed because of difficulty getting  therapeutic Dilantin levels, and on the day when we felt there were  sufficient lab work for him to be discharged, the patient was noted to be  chest with an occasional cough.  Chest x-ray revealed a questionable nodule, and also a CT scan of the chest  revealed a right upper lobe and left lower lobe pneumonia.  Small left upper  lobe nodule which may need to be followed up in six months.  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  The  patient's serum LDH was normal at 204.   The patient has remained afebrile throughout his hospital course.  The  patient's ABG showed no evidence of respiratory failure.  Therefore, it was  elected to treat the patient with Zithromax since there was no good evidence  of this being PCP.  The patient has been on continuous PCP prophylaxis.  The  patient refused a nicotine patch while in the hospital, and insisted on  going out to smoke despite the pneumonia and despite repeated counseling.   The patient's room had a persistent smell of urine, but he refused to be  examined for incontinence or any genital problem.  He denied any  incontinence.  Urinalysis was negative for proteins, leukocytes or nitrites,  and did show many bacteria and yeast, but this was of unclear significant.  The patient was subsequently started  on Diflucan.   DISCHARGE CONDITION:  This morning, the patient is alert and oriented and  has no complaints.  He has minimal cough.  He has no chest pain or shortness  of breath.  He speaks in full sentences without shortness of breath.  His  vitals:  Temperature 97.9, pulse 92, respirations 20, blood pressure 101/62.  ABG on room air, pH is 7.3, and INP cO2, 35.8, pO2 71.8.  Saturating at  93.8%.  His white count is 2.0.  His hemoglobin is 9.7, hematocrit 28.1.  MCV 92, RDS 14.8.  Platelet count 121,000.  His sodium was 128, potassium  4.2, chloride 101.  CO2 27.  Serum glucose 88.  BUN 13, creatinine 0.8.  Calcium 8.1.  LDH 204.   FOLLOWUP:  1. With Dr. Harrie Jeans at the [facility] in [place]     within one week.  2. The patient has been advised to contact the hospitalist service at [phone]     [phone] if he develops any problems before his appointment with Dr. Royetta Asal.     ___________________________________________                                         Vania Rea, M.D.   LC/MEDQ  D:  06/06/2004  T:  06/07/2004  Job:  [id number]

## 2011-05-17 NOTE — Consult Note (Signed)
NAME:  [name], [name]                      ACCOUNT NO.:  [id number]   MEDICAL RECORD NO.:  [id number]                   PATIENT TYPE:  INP   LOCATION:  A326                                 FACILITY:  [facility]   PHYSICIAN:  Kofi A. Gerilyn Pilgrim, M.D.              DATE OF BIRTH:  October 18, 1951   DATE OF CONSULTATION:  07/18/2004  DATE OF DISCHARGE:                                   CONSULTATION   REASON FOR CONSULTATION:  Gait disorder.  The patient's clinical examination  is consistent with a cervical myelopathy.  The potential etiologies include  myelopathy related to HIV and a low CD4 count.  He also could have an  opportunistic process from CMV.  Other consideration includes nutritional  and compressive lesions.  There does not appear to be a significant  peripheral problem given the brisk reflexes and increased tone in the legs  at this time.   RECOMMENDATIONS:  1. Cervical and thoracic MRI.  2. Extensive blood testing including CPK, homocystine level and     methylmalonic acid levels.  He already has tests that have been ordered.     B12 has been normal on this admission, although this may not tell the     entire story regarding his Vitamin deficiency and this is the reason     methylmalonic acid levels will be obtained.  3. He will probably need to have a spinal tap specifically to assess for CMV     and other opportunistic related infections.   HISTORY OF PRESENT ILLNESS:  This is a 60 year old, African-American  gentlemen who has a history of HIV.  He most recently has been hospitalized  a couple of months ago for new-onset seizures and was placed on Dilantin.  His workup included an EEG and brain MRI scan, both of which were  unremarkable.  It appears that he has not had a seizure since this  medication.  The patient presents with a 3 week history of numbness  involving the feet and legs up to about the knees bilaterally.  The patient  reports that episodically the numbness  progresses anteriorly to the mid  thoracic region, but does not last long.  He has also had progressive gait  impairment with weakness of the legs.  The patient does not endorse bowel  incontinence.  It appears that the patient was hospitalized earlier this  month for dizziness and numbness in the legs.   PAST MEDICAL HISTORY:  1. HIV positive diagnosed in 1997.  2. Pneumonia 1 month ago.  3. Seizure disorder.   PAST SURGICAL HISTORY:  Appendectomy.   ALLERGIES:  No known drug allergies.   FAMILY HISTORY:  Unremarkable.   MEDICATIONS ON ADMISSION:  Phenytoin 450 b.i.d., Bactrim, ascorbic acid,  thiamine, azithromycin, nelfinavir, fluconazole, nevirapine and delavirdine.   CURRENT MEDICATIONS:  Protonix, Demerol, Phenergan.  Apparently, his  antiretrovirals were held because of the concern they may be causing his  underlying problems with gait impairment.  He also is on Dilantin 250 mg  b.i.d., thiamine, Zithromax, ascorbic acid, Bactrim.   PHYSICAL EXAMINATION:  GENERAL:  Thin, pleasant gentleman in no acute  distress.  VITAL SIGNS:  Temperature 97.4, pulse 80, respirations 20, blood pressure  124/76.  NECK:  Supple.  HEENT:  Dentition poor.  ABDOMEN:  Soft.  EXTREMITIES:  No significant edema.  NEUROLOGIC:  The patient is awake and alert.  He converses coherently  without any language or cognitive impairment.  Pupils are 4 mm, brisk and  reactive.  Extraocular movements intact.  Facial motor strength is symmetric  with normal strength.  Tongue is midline.  Uvula is midline.  Shoulder  shrugs normal.  Normal tone, bulk and strength in the upper extremities.  In  the legs, he has 2/5 strength on the right and 3-/5 strength on the left.  The tone is significantly increased bilaterally.  Reflexes are  pathologically brisk in all four extremities, more in the legs with cross  adductors noted on both sides.  Plantar reflexes are downgoing on the right  and upgoing on the  left.   LABORATORY DATA AND X-RAY FINDINGS:  Head CT scan done on July 15, was  negative for an acute process.   WBC 2, hemoglobin 11, platelet count 147.  INR 0.8.  Sodium 138, potassium  4.4, chloride 108, CO2 20, glucose 99, BUN 14, creatinine 0.6, calcium 9.1,  magnesium 1.6.  Phenytoin level 14.3.  Vitamin B12 level 387 and folic RBC  384.  Both in the normal ranges.  Urinalysis is negative.  CD4 count less  than 10.   Thank you for this consultation.      ___________________________________________                                            Perlie Gold Gerilyn Pilgrim, M.D.   KAD/MEDQ  D:  07/18/2004  T:  07/18/2004  Job:  [id number]

## 2011-05-17 NOTE — Discharge Summary (Signed)
NAME:  [name], [name] NO.:  [id number]   MEDICAL RECORD NO.:  [id number]                   PATIENT TYPE:  INP   LOCATION:  A332                                 FACILITY:  [facility]   PHYSICIAN:  Vania Rea, M.D.              DATE OF BIRTH:  1951-11-12   DATE OF ADMISSION:  07/09/2004  DATE OF DISCHARGE:  07/12/2004                                 DISCHARGE SUMMARY   PRIMARY CARE PHYSICIAN:  Dr. Harrie Jeans at [facility] infectious disease  clinic.   DISCHARGE DIAGNOSES:  1. Dizziness, resolved.  2. Acquired immune deficiency syndrome.  3. Pancytopenia.  4. Tobacco abuse.  5. Seborrheic dermatitis.  6. Seizure disorder.   DISPOSITION:  Discharge to home.   DISCHARGE CONDITION:  Stable.   DISCHARGE MEDICATIONS:  1. Dilantin 300 mg at bedtime, 200 mg in the morning.  2. Viramune 200 mg twice daily.  3. Viracept 1250 mg twice daily.  4. Rescriptor 400 mg three times daily.  5. Sustiva 600 mg daily.  6. Thiamine 100 mg daily.  7. Vitamin C 500 mg daily.  8. Multivitamins one daily.  9. Bactrim double strength one tablet daily.  10.      Zithromax 600 mg two tablets every Thursday.   HOSPITAL COURSE:  Please refer to history and physical of July 09, 2004.  This is a 60 year old man with known HIV, AIDS followed by the infectious  disease clinic at [facility] who was first seen about a month ago with new  onset seizures and sent home with a new prescription for Dilantin as well as  HIV medications.  The patient also has a history of alcohol abuse and  tobacco abuse and apparently after discharge to home after previous  admission patient left his home, went out onto the streets since his family  was unable to take care of him and was found in a disheveled condition and  confused, apparently had not been taking his medications and had multiple  seizures.  He was somehow [facility] to [facility] and [facility] was  discharged to a rest  home a few days prior to his admission to this hospital  on this occasion.  Caregivers at the rest home say that patient has been  complaining of dizziness and numbness and they are not sure how to manage a  patient with HIV, that they did not know he had HIV, in fact, before his  arrival at the rest home.  Patient was assessed and was found to be anemic  with an H&H of 8.8 and somewhat hypotensive.  Patient was admitted and  transfused 2 units of packed red cells and his condition improved  significantly.  He was restarted on his medications.   This morning patient is looking well and has no complaints other than he was  given liquid Zithromax which upset his stomach.  His temperature is 98.1,  pulse  90, respirations 24, blood pressure 115/74.  His pupils are round,  equal and reactive.  His chest is clear to auscultation bilaterally.  His  abdomen is soft and nontender.  He has no edema.  He has seborrheic  dermatitis of his scalp and his face but it is much improved from his  previous visit.  He has been using __________ shampoo.  His most recent labs  show a white count of 2.7, hematocrit of 30.7, hemoglobin of 10.6, platelet  count of 118 and he has a normal differential on his white count.  This  seems to be pretty much his baseline.  His serum chemistry when checked on  admission was normal.  His phenantoin level this morning was 13.3.  Urinalysis on admission showed dehydration and he has been receiving IV  fluids.  The patient is stable for discharge and he is being discharged to a  local rest home.  Followup care with his primary care physicians at [facility] specialty clinic.     ___________________________________________                                         Vania Rea, M.D.   LC/MEDQ  D:  07/12/2004  T:  07/12/2004  Job:  [id number]

## 2011-10-08 LAB — T-HELPER CELL (CD4) - (RCID CLINIC ONLY)
CD4 % Helper T Cell: 21 — ABNORMAL LOW
CD4 T Cell Abs: 490

## 2011-10-11 LAB — T-HELPER CELL (CD4) - (RCID CLINIC ONLY)
CD4 % Helper T Cell: 20 — ABNORMAL LOW
CD4 T Cell Abs: 530

## 2011-12-04 ENCOUNTER — Other Ambulatory Visit: Payer: Self-pay | Admitting: Internal Medicine

## 2011-12-04 ENCOUNTER — Other Ambulatory Visit: Payer: Self-pay | Admitting: *Deleted

## 2011-12-04 ENCOUNTER — Telehealth: Payer: Self-pay | Admitting: *Deleted

## 2011-12-04 DIAGNOSIS — B20 Human immunodeficiency virus [HIV] disease: Secondary | ICD-10-CM

## 2011-12-04 MED ORDER — LAMIVUDINE-ZIDOVUDINE 150-300 MG PO TABS: 1.0000 | ORAL_TABLET | Freq: Two times a day (BID) | ORAL | Status: DC

## 2011-12-04 MED ORDER — LOPINAVIR-RITONAVIR 200-50 MG PO TABS: 2.0000 | ORAL_TABLET | Freq: Two times a day (BID) | ORAL | Status: DC

## 2011-12-04 NOTE — Telephone Encounter (Signed)
Patient called requesting refills on 042 meds.  Sent one month supply to pharmacy and patient given lab and office visit appointments.  Had not been seen since Dr. Philipp Deputy on 08/2010. Wendall Mola CMA

## 2011-12-16 ENCOUNTER — Other Ambulatory Visit: Payer: Self-pay | Admitting: *Deleted

## 2011-12-16 ENCOUNTER — Other Ambulatory Visit: Payer: Self-pay | Admitting: Internal Medicine

## 2011-12-16 DIAGNOSIS — B20 Human immunodeficiency virus [HIV] disease: Secondary | ICD-10-CM

## 2011-12-16 DIAGNOSIS — Z79899 Other long term (current) drug therapy: Secondary | ICD-10-CM

## 2011-12-16 MED ORDER — LAMIVUDINE-ZIDOVUDINE 150-300 MG PO TABS: 1.0000 | ORAL_TABLET | Freq: Two times a day (BID) | ORAL | Status: DC

## 2011-12-16 MED ORDER — LOPINAVIR-RITONAVIR 200-50 MG PO TABS: 2.0000 | ORAL_TABLET | Freq: Two times a day (BID) | ORAL | Status: DC

## 2011-12-19 ENCOUNTER — Other Ambulatory Visit: Payer: Self-pay

## 2012-01-02 ENCOUNTER — Other Ambulatory Visit: Payer: Self-pay

## 2012-01-02 ENCOUNTER — Ambulatory Visit: Payer: Self-pay | Admitting: Internal Medicine

## 2012-01-10 ENCOUNTER — Other Ambulatory Visit: Payer: Self-pay | Admitting: Licensed Clinical Social Worker

## 2012-01-10 DIAGNOSIS — B20 Human immunodeficiency virus [HIV] disease: Secondary | ICD-10-CM

## 2012-01-10 MED ORDER — LAMIVUDINE-ZIDOVUDINE 150-300 MG PO TABS: 1.0000 | ORAL_TABLET | Freq: Two times a day (BID) | ORAL | Status: DC

## 2012-01-10 MED ORDER — LOPINAVIR-RITONAVIR 200-50 MG PO TABS: 2.0000 | ORAL_TABLET | Freq: Two times a day (BID) | ORAL | Status: DC

## 2012-01-14 ENCOUNTER — Ambulatory Visit: Payer: Self-pay | Admitting: Internal Medicine

## 2012-01-30 ENCOUNTER — Other Ambulatory Visit: Payer: Self-pay | Admitting: *Deleted

## 2012-01-30 ENCOUNTER — Telehealth: Payer: Self-pay | Admitting: *Deleted

## 2012-01-30 ENCOUNTER — Other Ambulatory Visit: Payer: Medicaid Other

## 2012-01-30 DIAGNOSIS — B20 Human immunodeficiency virus [HIV] disease: Secondary | ICD-10-CM

## 2012-01-30 DIAGNOSIS — G40909 Epilepsy, unspecified, not intractable, without status epilepticus: Secondary | ICD-10-CM

## 2012-01-30 DIAGNOSIS — Z79899 Other long term (current) drug therapy: Secondary | ICD-10-CM

## 2012-01-30 LAB — LIPID PANEL
LDL Cholesterol: 103 mg/dL — ABNORMAL HIGH (ref 0–99)
VLDL: 14 mg/dL (ref 0–40)

## 2012-01-30 LAB — CBC WITH DIFFERENTIAL/PLATELET
Hemoglobin: 13.6 g/dL (ref 13.0–17.0)
Lymphocytes Relative: 66 % — ABNORMAL HIGH (ref 12–46)
Lymphs Abs: 3.9 10*3/uL (ref 0.7–4.0)
MCH: 31.3 pg (ref 26.0–34.0)
Monocytes Relative: 9 % (ref 3–12)
Neutro Abs: 1.3 10*3/uL — ABNORMAL LOW (ref 1.7–7.7)
Neutrophils Relative %: 23 % — ABNORMAL LOW (ref 43–77)
RBC: 4.34 MIL/uL (ref 4.22–5.81)
WBC: 6 10*3/uL (ref 4.0–10.5)

## 2012-01-30 LAB — COMPREHENSIVE METABOLIC PANEL
Albumin: 4 g/dL (ref 3.5–5.2)
CO2: 24 mEq/L (ref 19–32)
Chloride: 108 mEq/L (ref 96–112)
Glucose, Bld: 89 mg/dL (ref 70–99)
Potassium: 4.1 mEq/L (ref 3.5–5.3)
Sodium: 141 mEq/L (ref 135–145)
Total Protein: 6.9 g/dL (ref 6.0–8.3)

## 2012-01-30 MED ORDER — LOPINAVIR-RITONAVIR 200-50 MG PO TABS: 2.0000 | ORAL_TABLET | Freq: Two times a day (BID) | ORAL | Status: DC

## 2012-01-30 MED ORDER — LAMIVUDINE-ZIDOVUDINE 150-300 MG PO TABS: 1.0000 | ORAL_TABLET | Freq: Two times a day (BID) | ORAL | Status: DC

## 2012-01-30 MED ORDER — PHENYTOIN 100 MG/4ML PO SUSP: 100.0000 mg | Freq: Two times a day (BID) | ORAL | Status: DC

## 2012-01-30 NOTE — Telephone Encounter (Signed)
Patient came into clinic for labs and requested refill on HIV meds and Dilantin.  Told him would refill for one month only until his appointment with Dr. Drue Second in February. Wendall Mola CMA

## 2012-01-31 LAB — T-HELPER CELL (CD4) - (RCID CLINIC ONLY)
CD4 % Helper T Cell: 25 % — ABNORMAL LOW (ref 33–55)
CD4 T Cell Abs: 1010 uL (ref 400–2700)

## 2012-01-31 LAB — GC/CHLAMYDIA PROBE AMP, URINE: Chlamydia, Swab/Urine, PCR: NEGATIVE

## 2012-02-03 LAB — HIV-1 RNA QUANT-NO REFLEX-BLD
HIV 1 RNA Quant: 194 copies/mL — ABNORMAL HIGH (ref <20)
HIV-1 RNA Quant, Log: 2.29 {Log} — ABNORMAL HIGH (ref <1.30)

## 2012-02-13 ENCOUNTER — Encounter: Payer: Self-pay | Admitting: Internal Medicine

## 2012-02-13 ENCOUNTER — Ambulatory Visit (INDEPENDENT_AMBULATORY_CARE_PROVIDER_SITE_OTHER): Payer: Medicaid Other | Admitting: Internal Medicine

## 2012-02-13 VITALS — BP 144/86 | HR 92 | Temp 98.3°F | Wt 149.0 lb

## 2012-02-13 DIAGNOSIS — Z23 Encounter for immunization: Secondary | ICD-10-CM

## 2012-02-13 DIAGNOSIS — B20 Human immunodeficiency virus [HIV] disease: Secondary | ICD-10-CM

## 2012-02-14 LAB — URINALYSIS
Bilirubin Urine: NEGATIVE
Glucose, UA: NEGATIVE mg/dL
Leukocytes, UA: NEGATIVE
Specific Gravity, Urine: 1.02 (ref 1.005–1.030)
Urobilinogen, UA: 1 mg/dL (ref 0.0–1.0)

## 2012-02-14 LAB — GC/CHLAMYDIA PROBE AMP, URINE
Chlamydia, Swab/Urine, PCR: NEGATIVE
GC Probe Amp, Urine: NEGATIVE

## 2012-02-14 LAB — HEPATITIS C ANTIBODY: HCV Ab: NEGATIVE

## 2012-02-17 LAB — HIV-1 RNA ULTRAQUANT REFLEX TO GENTYP+: HIV-1 RNA Quant, Log: 2 {Log} — ABNORMAL HIGH (ref <1.30)

## 2012-03-16 ENCOUNTER — Other Ambulatory Visit: Payer: Self-pay | Admitting: *Deleted

## 2012-03-16 DIAGNOSIS — G40909 Epilepsy, unspecified, not intractable, without status epilepticus: Secondary | ICD-10-CM

## 2012-03-16 DIAGNOSIS — B20 Human immunodeficiency virus [HIV] disease: Secondary | ICD-10-CM

## 2012-03-16 MED ORDER — LOPINAVIR-RITONAVIR 200-50 MG PO TABS: 2.0000 | ORAL_TABLET | Freq: Two times a day (BID) | ORAL | Status: DC

## 2012-03-16 MED ORDER — PHENYTOIN 100 MG/4ML PO SUSP: 100.0000 mg | Freq: Two times a day (BID) | ORAL | Status: DC

## 2012-03-16 MED ORDER — LAMIVUDINE-ZIDOVUDINE 150-300 MG PO TABS: 1.0000 | ORAL_TABLET | Freq: Two times a day (BID) | ORAL | Status: DC

## 2012-04-02 ENCOUNTER — Other Ambulatory Visit: Payer: Self-pay | Admitting: Internal Medicine

## 2012-04-02 DIAGNOSIS — B20 Human immunodeficiency virus [HIV] disease: Secondary | ICD-10-CM

## 2012-04-16 ENCOUNTER — Other Ambulatory Visit: Payer: Self-pay | Admitting: Licensed Clinical Social Worker

## 2012-04-16 DIAGNOSIS — B20 Human immunodeficiency virus [HIV] disease: Secondary | ICD-10-CM

## 2012-04-16 MED ORDER — ACYCLOVIR 400 MG PO TABS: 400.0000 mg | ORAL_TABLET | Freq: Two times a day (BID) | ORAL | Status: DC

## 2012-04-28 ENCOUNTER — Other Ambulatory Visit: Payer: Medicaid Other

## 2012-05-01 ENCOUNTER — Telehealth: Payer: Self-pay | Admitting: *Deleted

## 2012-05-01 ENCOUNTER — Other Ambulatory Visit: Payer: Self-pay | Admitting: *Deleted

## 2012-05-01 DIAGNOSIS — R569 Unspecified convulsions: Secondary | ICD-10-CM

## 2012-05-01 MED ORDER — PHENYTOIN SODIUM EXTENDED 200 MG PO CAPS: 200.0000 mg | ORAL_CAPSULE | Freq: Every day | ORAL | Status: DC

## 2012-05-01 NOTE — Telephone Encounter (Signed)
Patient requesting his Dilantin be changed from suspension to pill form. Spoke with Dr. Drue Second and she checked to be sure of the correct dosage.  Dr. Drue Second changed it back to pills, for a total of 200 mg. At bedtime only.  She will check a dilantin level at his office visit, on 05/12/12. Wendall Mola CMA

## 2012-05-12 ENCOUNTER — Ambulatory Visit: Payer: Medicaid Other | Admitting: Internal Medicine

## 2012-05-13 NOTE — Progress Notes (Signed)
HIV CLINIC INITIAL VISIT  RFV=  establishing care Subjective:    [name], male    DOB: 1951/03/28, 61 y.o.   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 He also caries sz disorder dx on phenytoin 100mg  BID plus extra tab m-w-f. (unusual regimen). Doing well. No complaints. No hx of fever/chills/ns/d/v/n/constipation/rash/headache/ nor sz.  Prior to Admission medications   Medication Sig Start Date End Date Taking? Authorizing Provider  sildenafil (VIAGRA) 25 MG tablet Take 25 mg by mouth daily as needed.     Yes Historical Provider, MD  acyclovir (ZOVIRAX) 400 MG tablet Take 1 tablet (400 mg total) by mouth 2 (two) times daily. 04/16/12   Ginnie Smart, MD  lamiVUDine-zidovudine (COMBIVIR) 150-300 MG per tablet Take 1 tablet by mouth 2 (two) times daily. 03/16/12   Judyann Munson, MD  lopinavir-ritonavir (KALETRA) 200-50 MG per tablet Take 2 tablets by mouth 2 (two) times daily. 03/16/12   Judyann Munson, MD  phenytoin (DILANTIN) 200 MG ER capsule Take 1 capsule (200 mg total) by mouth at bedtime. 05/01/12   Judyann Munson, MD   Active Ambulatory Problems    Diagnosis Date Noted  . HIV DISEASE 10/22/2006  . 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  . PNEUMOCYSTIS PNEUMONIA 10/22/2006  . ERECTILE DYSFUNCTION 04/13/2008  . GLAUCOMA NOS 08/19/2007  . COPD 10/22/2006  . DENTAL CARIES 08/19/2007  . SEIZURE DISORDER 10/22/2006   Resolved Ambulatory Problems    Diagnosis Date Noted  . No Resolved Ambulatory Problems   No Additional Past Medical History   History  Substance Use Topics  . Smoking status: Current Everyday Smoker -- 25.0 packs/day    Types: Cigarettes  . Smokeless tobacco: Never Used  . Alcohol Use: No  -smokes 1.5 to 2 ppd. But had hx of stopping ofr 7 yrs.  Family hx =reviewed, non-contributory  Review of Systems Per h pi, no  positive ROs, does report having some erectile dysfunction    Objective:   Physical Exam BP 144/86  Pulse 92  Temp(Src) 98.3 F (36.8 C) (Oral)  Wt 149 lb (67.586 kg) Physical Exam  Constitutional: He is oriented to person, place, and time. He appears well-developed and well-nourished. No distress.  HENT:  Mouth/Throat: Oropharynx is clear and moist. No oropharyngeal exudate.  Cardiovascular: Normal rate, regular rhythm and normal heart sounds. Exam reveals no gallop and no friction rub.  No murmur heard.  Pulmonary/Chest: Effort normal and breath sounds normal. No respiratory distress. He has no wheezes.  Abdominal: Soft. Bowel sounds are normal. He exhibits no distension. There is no tenderness.  Lymphadenopathy:  He has no cervical adenopathy.  Neurological: He is alert and oriented to person, place, and time.  Skin: Skin is warm and dry. No rash noted. No erythema.  Psychiatric: He has a normal mood and affect. His behavior is normal.       Assessment & Plan:  HIV= will continue kaletra/combivir. Check cbc and cmp. See if can change to other regimen if having SE from combivir. We will check with his insurance to see that he doesn't have higher co-pay. He is not optimally virologically controlled since VL <200. Plan on ordering genotype at next visit  hiv maintenance =will check hepatitis labs, ua, toxo  Health promotion= will check his vaccines to see that he is up todate on pneumococcal.  Seizure management= he is on an unusual regimen. Will curbside neurology for regular dosing of dilantin and check level at next visit.  Cell contact: [phone]

## 2012-09-02 ENCOUNTER — Other Ambulatory Visit: Payer: Self-pay | Admitting: *Deleted

## 2012-09-02 DIAGNOSIS — R569 Unspecified convulsions: Secondary | ICD-10-CM

## 2012-09-02 DIAGNOSIS — B20 Human immunodeficiency virus [HIV] disease: Secondary | ICD-10-CM

## 2012-09-02 MED ORDER — PHENYTOIN SODIUM EXTENDED 200 MG PO CAPS: 200.0000 mg | ORAL_CAPSULE | Freq: Every day | ORAL | Status: DC

## 2012-09-02 MED ORDER — LOPINAVIR-RITONAVIR 200-50 MG PO TABS: 2.0000 | ORAL_TABLET | Freq: Two times a day (BID) | ORAL | Status: DC

## 2012-09-02 MED ORDER — LAMIVUDINE-ZIDOVUDINE 150-300 MG PO TABS: 1.0000 | ORAL_TABLET | Freq: Two times a day (BID) | ORAL | Status: DC

## 2012-09-02 NOTE — Telephone Encounter (Signed)
Pt needs to call [phone] select Option #1 to make lab and MD appts.  No Showed for last MD and lab appts.

## 2012-10-06 ENCOUNTER — Other Ambulatory Visit: Payer: Medicaid Other

## 2012-10-20 ENCOUNTER — Ambulatory Visit: Payer: Medicaid Other | Admitting: Internal Medicine

## 2012-11-03 ENCOUNTER — Other Ambulatory Visit: Payer: Medicaid Other

## 2012-12-01 ENCOUNTER — Other Ambulatory Visit: Payer: Self-pay | Admitting: *Deleted

## 2012-12-01 DIAGNOSIS — B20 Human immunodeficiency virus [HIV] disease: Secondary | ICD-10-CM

## 2012-12-01 DIAGNOSIS — R569 Unspecified convulsions: Secondary | ICD-10-CM

## 2012-12-01 MED ORDER — LOPINAVIR-RITONAVIR 200-50 MG PO TABS
2.0000 | ORAL_TABLET | Freq: Two times a day (BID) | ORAL | Status: DC
Start: 2012-12-01 — End: 2013-04-29

## 2012-12-01 MED ORDER — LAMIVUDINE-ZIDOVUDINE 150-300 MG PO TABS: 1.0000 | ORAL_TABLET | Freq: Two times a day (BID) | ORAL | Status: DC

## 2012-12-01 MED ORDER — PHENYTOIN SODIUM EXTENDED 200 MG PO CAPS: 200.0000 mg | ORAL_CAPSULE | Freq: Every day | ORAL | Status: DC

## 2012-12-03 ENCOUNTER — Encounter: Payer: Self-pay | Admitting: *Deleted

## 2012-12-03 ENCOUNTER — Ambulatory Visit: Payer: Medicaid Other | Admitting: Internal Medicine

## 2012-12-03 ENCOUNTER — Telehealth: Payer: Self-pay | Admitting: *Deleted

## 2012-12-03 NOTE — Telephone Encounter (Signed)
RN called patient to let him know he missed this morning's appointment, but was unable to leave a message at the number provided. Andree Coss, RN

## 2013-02-19 ENCOUNTER — Telehealth: Payer: Self-pay | Admitting: *Deleted

## 2013-02-19 NOTE — Telephone Encounter (Signed)
Called patient to try and schedule an appt but had to leave a message will refer to [facility].

## 2013-03-30 ENCOUNTER — Other Ambulatory Visit: Payer: Medicaid Other

## 2013-04-02 ENCOUNTER — Other Ambulatory Visit: Payer: Medicaid Other

## 2013-04-02 ENCOUNTER — Other Ambulatory Visit: Payer: Self-pay | Admitting: Internal Medicine

## 2013-04-02 DIAGNOSIS — B20 Human immunodeficiency virus [HIV] disease: Secondary | ICD-10-CM

## 2013-04-02 LAB — CBC WITH DIFFERENTIAL/PLATELET
Basophils Relative: 1 % (ref 0–1)
HCT: 41.5 % (ref 39.0–52.0)
Hemoglobin: 13.6 g/dL (ref 13.0–17.0)
Lymphocytes Relative: 41 % (ref 12–46)
MCHC: 32.8 g/dL (ref 30.0–36.0)
Monocytes Absolute: 0.6 10*3/uL (ref 0.1–1.0)
Monocytes Relative: 11 % (ref 3–12)
Neutro Abs: 1.8 10*3/uL (ref 1.7–7.7)

## 2013-04-02 LAB — COMPREHENSIVE METABOLIC PANEL
AST: 18 U/L (ref 0–37)
BUN: 14 mg/dL (ref 6–23)
Calcium: 10.3 mg/dL (ref 8.4–10.5)
Chloride: 110 mEq/L (ref 96–112)
Creat: 0.93 mg/dL (ref 0.50–1.35)
Total Bilirubin: 0.2 mg/dL — ABNORMAL LOW (ref 0.3–1.2)

## 2013-04-02 LAB — T-HELPER CELL (CD4) - (RCID CLINIC ONLY)
CD4 % Helper T Cell: 15 % — ABNORMAL LOW (ref 33–55)
CD4 T Cell Abs: 370 uL — ABNORMAL LOW (ref 400–2700)

## 2013-04-05 LAB — HIV-1 RNA QUANT-NO REFLEX-BLD: HIV 1 RNA Quant: 3566 copies/mL — ABNORMAL HIGH (ref <20)

## 2013-04-20 ENCOUNTER — Encounter: Payer: Self-pay | Admitting: Internal Medicine

## 2013-04-20 ENCOUNTER — Ambulatory Visit (INDEPENDENT_AMBULATORY_CARE_PROVIDER_SITE_OTHER): Payer: Medicaid Other | Admitting: Internal Medicine

## 2013-04-20 VITALS — BP 149/87 | HR 82 | Temp 98.0°F | Wt 150.0 lb

## 2013-04-20 DIAGNOSIS — B20 Human immunodeficiency virus [HIV] disease: Secondary | ICD-10-CM

## 2013-04-20 DIAGNOSIS — R569 Unspecified convulsions: Secondary | ICD-10-CM

## 2013-04-20 MED ORDER — PHENYTOIN SODIUM EXTENDED 200 MG PO CAPS: 200.0000 mg | ORAL_CAPSULE | Freq: Every day | ORAL | Status: DC

## 2013-04-20 NOTE — Progress Notes (Signed)
[facility] CLINIC  RFV: routine visit, had need to reschedule x 2 Subjective:    [name], male    DOB: 11-15-1951, 62 y.o.   MRN: [id number]  HPI [name] is a 62yo Male with HIV, c/b PCP and seizure disorder, recent CD 4 count of 370 (15%)/ VL 3566 which is worse than prior labs in dec 2013 : CD 4 count 1010/ VL 101. Currently on kaletra/combivir, claims to be takinging them regularly except that he has only been [place] monotherapy sans combivir for the last week. He is unable to afford $3 copay.  [name] brought him in from [place]. Having difficulty setting up ride to clinic via social services in [place].  Current Outpatient Prescriptions on File Prior to Visit  Medication Sig Dispense Refill  . acyclovir (ZOVIRAX) 400 MG tablet Take 1 tablet (400 mg total) by mouth 2 (two) times daily.  60 tablet  11  . lamiVUDine-zidovudine (COMBIVIR) 150-300 MG per tablet Take 1 tablet by mouth 2 (two) times daily.  60 tablet  3  . lopinavir-ritonavir (KALETRA) 200-50 MG per tablet Take 2 tablets by mouth 2 (two) times daily.  120 tablet  3  . phenytoin (DILANTIN) 200 MG ER capsule Take 1 capsule (200 mg total) by mouth at bedtime.  30 capsule  3  . sildenafil (VIAGRA) 25 MG tablet Take 25 mg by mouth daily as needed.         No current facility-administered medications on file prior to visit.   Active Ambulatory Problems    Diagnosis Date Noted  . HIV DISEASE 10/22/2006  . 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  . PNEUMOCYSTIS PNEUMONIA 10/22/2006  . ERECTILE DYSFUNCTION 04/13/2008  . GLAUCOMA NOS 08/19/2007  . COPD 10/22/2006  . DENTAL CARIES 08/19/2007  . SEIZURE DISORDER 10/22/2006   Resolved Ambulatory Problems    Diagnosis Date Noted  . No Resolved Ambulatory Problems   No Additional Past Medical History   History   Social History  . Marital Status: Single    Spouse Name: N/A    Number of Children: N/A  . Years of Education: N/A   Occupational History  . Not on  file.   Social History Main Topics  . Smoking status: Current Every Day Smoker -- 25.00 packs/day    Types: Cigarettes  . Smokeless tobacco: Never Used  . Alcohol Use: No  . Drug Use: No  . Sexually Active: Yes -- Male partner(s)     Comment: patinet given condoms   Other Topics Concern  . Not on file   Social History Narrative  . No narrative on file  family hx unchanged    Review of Systems Review of Systems  Constitutional: Negative for fever, chills, diaphoresis, activity change, appetite change, fatigue and unexpected weight change.  HENT: Negative for congestion, sore throat, rhinorrhea, sneezing, trouble swallowing and sinus pressure.  Eyes: Negative for photophobia and visual disturbance.  Respiratory: Negative for cough, chest tightness, shortness of breath, wheezing and stridor.  Cardiovascular: Negative for chest pain, palpitations and leg swelling.  Gastrointestinal: Negative for nausea, vomiting, abdominal pain, diarrhea, constipation, blood in stool, abdominal distention and anal bleeding.  Genitourinary: Negative for dysuria, hematuria, flank pain and difficulty urinating.  Musculoskeletal: Negative for myalgias, back pain, joint swelling, arthralgias and gait problem.  Skin: Negative for color change, pallor, rash and wound.  Neurological: Negative for dizziness, tremors, weakness and light-headedness.  Hematological: Negative for adenopathy. Does not bruise/bleed easily.  Psychiatric/Behavioral: Negative for behavioral problems, confusion,  sleep disturbance, dysphoric mood, decreased concentration and agitation.       Objective:   Physical Exam BP 149/87  Pulse 82  Temp(Src) 98 F (36.7 C) (Oral)  Wt 150 lb (68.04 kg)  BMI 20.93 kg/m2 Physical Exam  Constitutional: He is oriented to person, place, and time. He appears well-developed and well-nourished. No distress.  HENT:  Mouth/Throat: Oropharynx is clear and moist. No oropharyngeal exudate.   Cardiovascular: Normal rate, regular rhythm and normal heart sounds. Exam reveals no gallop and no friction rub.  No murmur heard.  Pulmonary/Chest: Effort normal and breath sounds normal. No respiratory distress. He has no wheezes.  Abdominal: Soft. Bowel sounds are normal. He exhibits no distension. There is no tenderness.  Lymphadenopathy:  He has no cervical adenopathy.  Neurological: He is alert and oriented to person, place, and time.  Skin: Skin is warm and dry. No rash noted. No erythema.  Psychiatric: He has a normal mood and affect. His behavior is normal.   Labs: Cr 1.0     Assessment & Plan:  HIV = not virologically controlled. Will add genotype. Will likely change to a different regimen. Will ask him to stop kaletra for now. Likely change him to truvada/DRV/r if genotype shows PI class is still viable  and no resistance to tenofovir.  Seizure disorder = currently  Ran out of dilantin 2 wks ago. Need to restart. And refer to neurology to establish care  Colon cancer screening = refer to GI for colonoscopy in the coming months  Financial stressors = will change him to [facility]- to deliver to home and not charge co-pay  Coordination of care = concern that the patient may also have cognitive impairment related to hiv vs. Seizure disorder. Will need to keep bridge counseling and also do adherence counseling when we start new regimen  rtc in 2-4 wks

## 2013-04-26 LAB — HIV-1 GENOTYPR PLUS

## 2013-04-29 ENCOUNTER — Other Ambulatory Visit: Payer: Self-pay | Admitting: *Deleted

## 2013-04-29 DIAGNOSIS — B20 Human immunodeficiency virus [HIV] disease: Secondary | ICD-10-CM

## 2013-04-29 MED ORDER — LAMIVUDINE-ZIDOVUDINE 150-300 MG PO TABS: 1.0000 | ORAL_TABLET | Freq: Two times a day (BID) | ORAL | Status: DC

## 2013-04-29 MED ORDER — ACYCLOVIR 400 MG PO TABS: 400.0000 mg | ORAL_TABLET | Freq: Two times a day (BID) | ORAL | Status: DC

## 2013-04-29 MED ORDER — LOPINAVIR-RITONAVIR 200-50 MG PO TABS: 2.0000 | ORAL_TABLET | Freq: Two times a day (BID) | ORAL | Status: DC

## 2013-05-05 ENCOUNTER — Ambulatory Visit: Payer: Medicaid Other | Admitting: Internal Medicine

## 2013-05-10 ENCOUNTER — Encounter: Payer: Self-pay | Admitting: *Deleted

## 2013-05-12 ENCOUNTER — Ambulatory Visit (INDEPENDENT_AMBULATORY_CARE_PROVIDER_SITE_OTHER): Payer: Medicaid Other | Admitting: Internal Medicine

## 2013-05-12 ENCOUNTER — Encounter: Payer: Self-pay | Admitting: Internal Medicine

## 2013-05-12 VITALS — BP 138/81 | HR 85 | Temp 97.9°F | Ht 70.0 in

## 2013-05-12 DIAGNOSIS — B009 Herpesviral infection, unspecified: Secondary | ICD-10-CM

## 2013-05-12 DIAGNOSIS — R569 Unspecified convulsions: Secondary | ICD-10-CM

## 2013-05-12 DIAGNOSIS — B20 Human immunodeficiency virus [HIV] disease: Secondary | ICD-10-CM

## 2013-05-12 MED ORDER — EMTRICITABINE-TENOFOVIR DF 200-300 MG PO TABS: 1.0000 | ORAL_TABLET | Freq: Every day | ORAL | Status: DC

## 2013-05-12 MED ORDER — RITONAVIR 100 MG PO TABS: 100.0000 mg | ORAL_TABLET | Freq: Every day | ORAL | Status: DC

## 2013-05-12 MED ORDER — LEVETIRACETAM 500 MG PO TABS: 500.0000 mg | ORAL_TABLET | Freq: Two times a day (BID) | ORAL | Status: DC

## 2013-05-12 MED ORDER — DARUNAVIR ETHANOLATE 800 MG PO TABS: 800.0000 mg | ORAL_TABLET | Freq: Every day | ORAL | Status: DC

## 2013-05-12 MED ORDER — VALACYCLOVIR HCL 1 G PO TABS: 1000.0000 mg | ORAL_TABLET | Freq: Every day | ORAL | Status: DC

## 2013-05-12 NOTE — Progress Notes (Signed)
[facility] CLINIC NOTE  RFV: routine Subjective:    [name], male    DOB: 03/18/1951, 62 y.o.   MRN: [id number]  HPI 62yo Male with  62yo Male with HIV, c/b PCP and seizure disorder, recent CD 4 count of 370 (15%)/ VL 3566 which is worse than prior labs in dec 2013 : CD 4 count 1010/ VL 101. Currently on kaletra/combivir, claims to be taking them regularly except that he has only been [place] monotherapy sans combivir for the last week.at our last visit, we asked him to stop meds to check genotype to see if we need to change regimen. He state he is still continued on the same regimen. Doing well overall.   Current Outpatient Prescriptions on File Prior to Visit  Medication Sig Dispense Refill  . acyclovir (ZOVIRAX) 400 MG tablet Take 1 tablet (400 mg total) by mouth 2 (two) times daily.  60 tablet  11  . lamiVUDine-zidovudine (COMBIVIR) 150-300 MG per tablet Take 1 tablet by mouth 2 (two) times daily.  60 tablet  3  . lopinavir-ritonavir (KALETRA) 200-50 MG per tablet Take 2 tablets by mouth 2 (two) times daily.  120 tablet  3  . phenytoin (DILANTIN) 200 MG ER capsule Take 1 capsule (200 mg total) by mouth at bedtime.  30 capsule  3  . sildenafil (VIAGRA) 25 MG tablet Take 25 mg by mouth daily as needed.         No current facility-administered medications on file prior to visit.    Social and family hx unchanged Review of Systems   Constitutional: Negative for fever, chills, diaphoresis, activity change, appetite change, fatigue and unexpected weight change.  HENT: Negative for congestion, sore throat, rhinorrhea, sneezing, trouble swallowing and sinus pressure.  Eyes: Negative for photophobia and visual disturbance.  Respiratory: Negative for cough, chest tightness, shortness of breath, wheezing and stridor.  Cardiovascular: Negative for chest pain, palpitations and leg swelling.  Gastrointestinal: Negative for nausea, vomiting, abdominal pain, diarrhea,  constipation, blood in stool, abdominal distention and anal bleeding.  Genitourinary: Negative for dysuria, hematuria, flank pain and difficulty urinating.  Musculoskeletal: Negative for myalgias, back pain, joint swelling, arthralgias and gait problem.  Skin: Negative for color change, pallor, rash and wound.  Neurological: Negative for dizziness, tremors, weakness and light-headedness.  Hematological: Negative for adenopathy. Does not bruise/bleed easily.  Psychiatric/Behavioral: Negative for behavioral problems, confusion, sleep disturbance, dysphoric mood, decreased concentration and agitation.       Objective:   Physical Exam BP 138/81  Pulse 85  Temp(Src) 97.9 F (36.6 C) (Oral)  Ht 5\' 10"  (1.778 m) Physical Exam  Constitutional: He is oriented to person, place, and time. He appears well-developed and well-nourished. No distress.  HENT:  Mouth/Throat: Oropharynx is clear and moist. No oropharyngeal exudate.  Cardiovascular: Normal rate, regular rhythm and normal heart sounds. Exam reveals no gallop and no friction rub.  No murmur heard.  Pulmonary/Chest: Effort normal and breath sounds normal. No respiratory distress. He has no wheezes.  Abdominal: Soft. Bowel sounds are normal. He exhibits no distension. There is no tenderness.  Lymphadenopathy:  He has no cervical adenopathy.  Neurological: He is alert and oriented to person, place, and time.  Skin: Skin is warm and dry. No rash noted. No erythema.  Psychiatric: He has a normal mood and affect. His behavior is normal.       Assessment & Plan:  HIV = genotype does not show PI resistance. We will transition him  off of kaletra/combivir to truvada/DRVr daily dosing. Discussed adherence and new schedule of meds.  Seizure d/o = change from dilantin to keppra 500mg  BID so that it doesn't interfere with hiv meds  HSV proph = will change acy 400 bid to valtrex 1gm daily to minimize pill burden  rtc in 4 wks with meds to load  pill box and review adherence and repeat vl

## 2013-06-18 ENCOUNTER — Ambulatory Visit: Payer: Medicaid Other | Admitting: Internal Medicine

## 2013-06-29 ENCOUNTER — Ambulatory Visit: Payer: Medicaid Other | Admitting: Internal Medicine

## 2013-07-01 ENCOUNTER — Ambulatory Visit: Payer: Medicaid Other | Admitting: Internal Medicine

## 2013-07-06 ENCOUNTER — Ambulatory Visit: Payer: Medicaid Other | Admitting: Internal Medicine

## 2013-07-07 ENCOUNTER — Telehealth: Payer: Self-pay | Admitting: *Deleted

## 2013-07-07 NOTE — Telephone Encounter (Signed)
Patient is open with [facility] and when Cassandra called the patient to verify pick up he advised her that he had other plans and to just reschedule the appt. She advised she will call the patient later this week and try to reschedule with him.

## 2013-08-04 ENCOUNTER — Telehealth: Payer: Self-pay | Admitting: *Deleted

## 2013-08-04 NOTE — Telephone Encounter (Signed)
Called the patient and made him an appt to see Dr Drue Second as he is on the undetectable list. He is coming in 9/11 @ 11 am.

## 2013-09-09 ENCOUNTER — Ambulatory Visit: Payer: Medicaid Other | Admitting: Internal Medicine

## 2013-09-15 ENCOUNTER — Ambulatory Visit: Payer: Medicaid Other | Admitting: Internal Medicine

## 2014-04-26 ENCOUNTER — Other Ambulatory Visit: Payer: Self-pay | Admitting: Internal Medicine

## 2014-05-26 ENCOUNTER — Encounter: Payer: Self-pay | Admitting: *Deleted

## 2014-06-06 ENCOUNTER — Encounter: Payer: Self-pay | Admitting: *Deleted

## 2014-06-06 NOTE — Progress Notes (Signed)
[name], male   DOB: September 25, 1951, 63 y.o.   MRN: [id number] [facility] Referral - detectable VL, unable to reach pt by phone or letter which was returned.  Last OV 4/ 2014.

## 2014-06-09 ENCOUNTER — Other Ambulatory Visit: Payer: Self-pay | Admitting: Internal Medicine

## 2014-06-09 ENCOUNTER — Telehealth: Payer: Self-pay | Admitting: *Deleted

## 2014-06-09 DIAGNOSIS — B009 Herpesviral infection, unspecified: Secondary | ICD-10-CM

## 2014-06-09 DIAGNOSIS — B20 Human immunodeficiency virus [HIV] disease: Secondary | ICD-10-CM

## 2014-06-09 DIAGNOSIS — R569 Unspecified convulsions: Secondary | ICD-10-CM

## 2014-06-09 NOTE — Telephone Encounter (Signed)
Received refill requests from [facility].  Patient hasn't been seen in 13 months, but per the pharmacist (Sam) he has been getting his medications monthly for the last year.  Patient gets his medications delivered at his house or his mother's if he is unavailable.   RN authorized one month's supply. Patient must be seen before additional refills will be authorized.  Per Sam, patient is aware that he needs an appointment, states he is working with his case Freight forwarder on this.  RN will follow up with [facility].  Patient was detectable at last labs (April 2014). Landis Gandy, RN

## 2014-07-11 ENCOUNTER — Other Ambulatory Visit (HOSPITAL_COMMUNITY)
Admission: RE | Admit: 2014-07-11 | Discharge: 2014-07-11 | Disposition: A | Discharge status: Home / Self Care | Payer: Medicaid Other | Source: Ambulatory Visit | Attending: Infectious Diseases | Admitting: Infectious Diseases

## 2014-07-11 ENCOUNTER — Other Ambulatory Visit: Payer: Medicaid Other

## 2014-07-11 DIAGNOSIS — Z113 Encounter for screening for infections with a predominantly sexual mode of transmission: Secondary | ICD-10-CM | POA: Diagnosis not present

## 2014-07-11 DIAGNOSIS — B20 Human immunodeficiency virus [HIV] disease: Secondary | ICD-10-CM

## 2014-07-11 DIAGNOSIS — Z79899 Other long term (current) drug therapy: Secondary | ICD-10-CM

## 2014-07-11 LAB — COMPLETE METABOLIC PANEL WITH GFR
ALT: 22 U/L (ref 0–53)
AST: 19 U/L (ref 0–37)
Albumin: 3.9 g/dL (ref 3.5–5.2)
Alkaline Phosphatase: 114 U/L (ref 39–117)
BUN: 17 mg/dL (ref 6–23)
CALCIUM: 10.7 mg/dL — AB (ref 8.4–10.5)
CHLORIDE: 108 meq/L (ref 96–112)
CO2: 27 meq/L (ref 19–32)
Creat: 0.92 mg/dL (ref 0.50–1.35)
GFR, Est African American: >89 mL/min
GFR, Est Non African American: 88 mL/min
Glucose, Bld: 76 mg/dL (ref 70–99)
Potassium: 3.9 mEq/L (ref 3.5–5.3)
SODIUM: 141 meq/L (ref 135–145)
TOTAL PROTEIN: 6.6 g/dL (ref 6.0–8.3)
Total Bilirubin: 0.3 mg/dL (ref 0.2–1.2)

## 2014-07-11 LAB — CBC WITH DIFFERENTIAL/PLATELET
Basophils Absolute: 0 10*3/uL (ref 0.0–0.1)
Basophils Relative: 0 % (ref 0–1)
EOS PCT: 2 % (ref 0–5)
Eosinophils Absolute: 0.1 10*3/uL (ref 0.0–0.7)
HCT: 44.8 % (ref 39.0–52.0)
Hemoglobin: 15.6 g/dL (ref 13.0–17.0)
LYMPHS ABS: 2.2 10*3/uL (ref 0.7–4.0)
LYMPHS PCT: 47 % — AB (ref 12–46)
MCH: 31.3 pg (ref 26.0–34.0)
MCHC: 34.8 g/dL (ref 30.0–36.0)
MCV: 89.8 fL (ref 78.0–100.0)
Monocytes Absolute: 0.5 10*3/uL (ref 0.1–1.0)
Monocytes Relative: 10 % (ref 3–12)
NEUTROS ABS: 1.9 10*3/uL (ref 1.7–7.7)
NEUTROS PCT: 41 % — AB (ref 43–77)
Platelets: 114 10*3/uL — ABNORMAL LOW (ref 150–400)
RBC: 4.99 MIL/uL (ref 4.22–5.81)
RDW: 13.5 % (ref 11.5–15.5)
WBC: 4.6 10*3/uL (ref 4.0–10.5)

## 2014-07-11 LAB — LIPID PANEL
Cholesterol: 113 mg/dL (ref 0–200)
HDL: 33 mg/dL — AB (ref >39)
LDL CALC: 68 mg/dL (ref 0–99)
Total CHOL/HDL Ratio: 3.4 Ratio
Triglycerides: 59 mg/dL (ref <150)
VLDL: 12 mg/dL (ref 0–40)

## 2014-07-11 LAB — RPR

## 2014-07-12 ENCOUNTER — Other Ambulatory Visit: Payer: Self-pay | Admitting: Licensed Clinical Social Worker

## 2014-07-12 DIAGNOSIS — B20 Human immunodeficiency virus [HIV] disease: Secondary | ICD-10-CM

## 2014-07-12 LAB — T-HELPER CELL (CD4) - (RCID CLINIC ONLY)
CD4 % Helper T Cell: 22 % — ABNORMAL LOW (ref 33–55)
CD4 T Cell Abs: 490 /uL (ref 400–2700)

## 2014-07-12 MED ORDER — DARUNAVIR ETHANOLATE 800 MG PO TABS: ORAL_TABLET | ORAL | Status: DC

## 2014-07-12 MED ORDER — EMTRICITABINE-TENOFOVIR DF 200-300 MG PO TABS: ORAL_TABLET | ORAL | Status: DC

## 2014-07-12 MED ORDER — RITONAVIR 100 MG PO TABS: ORAL_TABLET | ORAL | Status: DC

## 2014-07-13 LAB — HIV-1 RNA QUANT-NO REFLEX-BLD

## 2014-07-25 ENCOUNTER — Ambulatory Visit (INDEPENDENT_AMBULATORY_CARE_PROVIDER_SITE_OTHER): Payer: Medicaid Other | Admitting: Internal Medicine

## 2014-07-25 ENCOUNTER — Other Ambulatory Visit: Payer: Self-pay | Admitting: *Deleted

## 2014-07-25 ENCOUNTER — Encounter: Payer: Self-pay | Admitting: Internal Medicine

## 2014-07-25 VITALS — BP 161/87 | HR 73 | Temp 98.2°F | Ht 71.0 in | Wt 142.2 lb

## 2014-07-25 DIAGNOSIS — R636 Underweight: Secondary | ICD-10-CM

## 2014-07-25 DIAGNOSIS — R569 Unspecified convulsions: Secondary | ICD-10-CM

## 2014-07-25 DIAGNOSIS — I10 Essential (primary) hypertension: Secondary | ICD-10-CM

## 2014-07-25 DIAGNOSIS — B20 Human immunodeficiency virus [HIV] disease: Secondary | ICD-10-CM

## 2014-07-25 MED ORDER — RITONAVIR 100 MG PO TABS: ORAL_TABLET | ORAL | Status: DC

## 2014-07-25 MED ORDER — AMLODIPINE BESYLATE 5 MG PO TABS: 5.0000 mg | ORAL_TABLET | Freq: Every day | ORAL | Status: DC

## 2014-07-25 MED ORDER — LEVETIRACETAM 500 MG PO TABS: ORAL_TABLET | ORAL | Status: DC

## 2014-07-25 MED ORDER — ENSURE PO LIQD: 237.0000 mL | Freq: Two times a day (BID) | ORAL | Status: DC

## 2014-07-25 MED ORDER — DARUNAVIR ETHANOLATE 800 MG PO TABS: ORAL_TABLET | ORAL | Status: DC

## 2014-07-25 MED ORDER — EMTRICITABINE-TENOFOVIR DF 200-300 MG PO TABS: ORAL_TABLET | ORAL | Status: DC

## 2014-07-25 NOTE — Progress Notes (Signed)
Subjective:    [name], male    DOB: 03/05/1951, 63 y.o.   MRN: [id number]  HPI 63yo M with HIV disease and seizure d/o, CD 4 count of 490/VL<20, truvada/DRVr. He reports that he now has housing and no longer homeless. He states " for awhile there, it was rough". He takes his medicines routinely. He now lives in [place]. Does not have a car, bridge counselor gets him to his appt. He has a place but has food insecurity.  Current Outpatient Prescriptions on File Prior to Visit  Medication Sig Dispense Refill  . Darunavir Ethanolate (PREZISTA) 800 MG tablet TAKE 1 TABLET (800 MG TOTAL) BY MOUTH DAILY WITH BREAKFAST.  30 tablet  0  . emtricitabine-tenofovir (TRUVADA) 200-300 MG per tablet TAKE 1 TABLET BY MOUTH DAILY.  30 tablet  0  . ritonavir (NORVIR) 100 MG TABS tablet TAKE 1 TABLET (100 MG TOTAL) BY MOUTH DAILY WITH BREAKFAST.  30 tablet  0  . levETIRAcetam (KEPPRA) 500 MG tablet TAKE 1 TABLET (500 MG TOTAL) BY MOUTH EVERY 12 HOURS.  60 tablet  0  . valACYclovir (VALTREX) 1000 MG tablet TAKE 1 TABLET (1,000 MG TOTAL) BY MOUTH DAILY.  30 tablet  0   No current facility-administered medications on file prior to visit.   Active Ambulatory Problems    Diagnosis Date Noted  . HIV DISEASE 10/22/2006  . 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  . PNEUMOCYSTIS PNEUMONIA 10/22/2006  . ERECTILE DYSFUNCTION 04/13/2008  . GLAUCOMA NOS 08/19/2007  . COPD 10/22/2006  . DENTAL CARIES 08/19/2007  . SEIZURE DISORDER 10/22/2006   Resolved Ambulatory Problems    Diagnosis Date Noted  . No Resolved Ambulatory Problems   No Additional Past Medical History       Review of Systems Review of Systems  Constitutional: Negative for fever, chills, diaphoresis, activity change, appetite change, fatigue and unexpected weight change.  HENT: Negative for congestion, sore throat, rhinorrhea, sneezing, trouble swallowing and sinus pressure.  Eyes: Negative for photophobia and visual disturbance.    Respiratory: Negative for cough, chest tightness, shortness of breath, wheezing and stridor.  Cardiovascular: Negative for chest pain, palpitations and leg swelling.  Gastrointestinal: Negative for nausea, vomiting, abdominal pain, diarrhea, constipation, blood in stool, abdominal distention and anal bleeding.  Genitourinary: Negative for dysuria, hematuria, flank pain and difficulty urinating.  Musculoskeletal: Negative for myalgias, back pain, joint swelling, arthralgias and gait problem.  Skin: Negative for color change, pallor, rash and wound.  Neurological: Negative for dizziness, tremors, weakness and light-headedness.  Hematological: Negative for adenopathy. Does not bruise/bleed easily.  Psychiatric/Behavioral: Negative for behavioral problems, confusion, sleep disturbance, dysphoric mood, decreased concentration and agitation.       Objective:   Physical Exam BP 161/87  Pulse 73  Temp(Src) 98.2 F (36.8 C) (Oral)  Ht 5\' 11"  (1.803 m)  Wt 142 lb 4 oz (64.524 kg)  BMI 19.85 kg/m2 Physical Exam  Constitutional: He is oriented to person, place, and time. He appears well-developed and well-nourished. No distress.  HENT:  Mouth/Throat: Oropharynx is clear and moist. No oropharyngeal exudate.  Cardiovascular: Normal rate, regular rhythm and normal heart sounds. Exam reveals no gallop and no friction rub.  No murmur heard.  Pulmonary/Chest: Effort normal and breath sounds normal. No respiratory distress. He has no wheezes.  Abdominal: Soft. Bowel sounds are normal. He exhibits no distension. There is no tenderness.  Lymphadenopathy:  He has no cervical adenopathy.  Neurological: He is alert and oriented to person, place,  and time.  Skin: Skin is warm and dry. No rash noted. No erythema.  Psychiatric: He has a normal mood and affect. His behavior is normal.          Assessment & Plan:  HIV = well controlled since we last saw him in May and changed him to  truvada/DRVr   Seizure= off of keppra for the last 2 wks by accident. No new seizure. Start back at 500mg  daily x 7 days then increase to 500mg  bid  htn =  Will start him on amlodipine 5mg  daily but we will see how it impacts him at next visit nad titrate up if needed  Smoking cessation = discussed to cut back form 30 smokes/day  Underweight = will give rx for ensure plus get him linked into programs for food pantries. Will check cxr concern for weight loss /cancer

## 2014-09-26 ENCOUNTER — Ambulatory Visit (INDEPENDENT_AMBULATORY_CARE_PROVIDER_SITE_OTHER): Payer: Medicaid Other | Admitting: Internal Medicine

## 2014-09-26 ENCOUNTER — Encounter: Payer: Self-pay | Admitting: Internal Medicine

## 2014-09-26 VITALS — BP 124/84 | HR 79 | Temp 98.1°F | Ht 71.5 in | Wt 142.0 lb

## 2014-09-26 DIAGNOSIS — Z23 Encounter for immunization: Secondary | ICD-10-CM

## 2014-09-26 DIAGNOSIS — B20 Human immunodeficiency virus [HIV] disease: Secondary | ICD-10-CM

## 2014-09-26 DIAGNOSIS — R569 Unspecified convulsions: Secondary | ICD-10-CM

## 2014-09-26 DIAGNOSIS — I10 Essential (primary) hypertension: Secondary | ICD-10-CM

## 2014-09-26 NOTE — Progress Notes (Signed)
[name], male   DOB: 1951-12-17, 63 y.o.   MRN: [id number]       [name], male   DOB: 28-Oct-1951, 63 y.o.   MRN: [id number]  HPI 63yo M with HIV, CD 4 count 490/VL<20, on truvada/DRVr. COPD. Well controlled, great adherence. Denies any difficulty with his health. He is having some financial problems with meeting rent  Outpatient Encounter Prescriptions as of 09/26/2014  Medication Sig  . amLODipine (NORVASC) 5 MG tablet Take 1 tablet (5 mg total) by mouth daily.  . Darunavir Ethanolate (PREZISTA) 800 MG tablet TAKE 1 TABLET (800 MG TOTAL) BY MOUTH DAILY WITH BREAKFAST.  Marland Kitchen emtricitabine-tenofovir (TRUVADA) 200-300 MG per tablet TAKE 1 TABLET BY MOUTH DAILY.  Marland Kitchen ENSURE (ENSURE) Take 237 mLs by mouth 2 (two) times daily between meals.  . levETIRAcetam (KEPPRA) 500 MG tablet TAKE 1 TABLET (500 MG TOTAL) BY MOUTH EVERY 12 HOURS.  . ritonavir (NORVIR) 100 MG TABS tablet TAKE 1 TABLET (100 MG TOTAL) BY MOUTH DAILY WITH BREAKFAST.  . valACYclovir (VALTREX) 1000 MG tablet TAKE 1 TABLET (1,000 MG TOTAL) BY MOUTH DAILY.     Patient Active Problem List   Diagnosis Date Noted  . ERECTILE DYSFUNCTION 04/13/2008  . GLAUCOMA NOS 08/19/2007  . DENTAL CARIES 08/19/2007  . HIV DISEASE 10/22/2006  . 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  . PNEUMOCYSTIS PNEUMONIA 10/22/2006  . COPD 10/22/2006  . SEIZURE DISORDER 10/22/2006     Health Maintenance Due  Topic Date Due  . Tetanus/tdap  05/18/1969  . Colonoscopy  05/18/2000  . Zostavax  05/18/2010  . Influenza Vaccine  07/30/2014     Review of Systems 10 point ros is negative Physical Exam   BP 124/84  Pulse 79  Temp(Src) 98.1 F (36.7 C) (Oral)  Ht 5' 11.5" (1.816 m)  Wt 142 lb (64.411 kg)  BMI 19.53 kg/m2 Physical Exam  Constitutional: He is oriented to person, place, and time. He appears well-developed and well-nourished. No distress.  HENT:  Mouth/Throat: Oropharynx is clear and moist. No oropharyngeal  exudate.  Cardiovascular: Normal rate, regular rhythm and normal heart sounds. Exam reveals no gallop and no friction rub.  No murmur heard.  Pulmonary/Chest: Effort normal and breath sounds normal. No respiratory distress. He has no wheezes.  Abdominal: Soft. Bowel sounds are normal. He exhibits no distension. There is no tenderness.  Lymphadenopathy:  He has no cervical adenopathy.  Neurological: He is alert and oriented to person, place, and time.  Skin: Skin is warm and dry. No rash noted. No erythema.  Psychiatric: He has a normal mood and affect. His behavior is normal.    Lab Results  Component Value Date   CD4TCELL 22* 07/11/2014   Lab Results  Component Value Date   CD4TABS 490 07/11/2014   CD4TABS 370* 04/02/2013   CD4TABS 1010 01/30/2012   Lab Results  Component Value Date   HIV1RNAQUANT <20 07/11/2014   Lab Results  Component Value Date   HEPBSAB NEG 02/13/2012   No results found for this basename: RPR    CBC Lab Results  Component Value Date   WBC 4.6 07/11/2014   RBC 4.99 07/11/2014   HGB 15.6 07/11/2014   HCT 44.8 07/11/2014   PLT 114* 07/11/2014   MCV 89.8 07/11/2014   MCH 31.3 07/11/2014   MCHC 34.8 07/11/2014   RDW 13.5 07/11/2014   LYMPHSABS 2.2 07/11/2014   MONOABS 0.5 07/11/2014   EOSABS 0.1 07/11/2014   BASOSABS 0.0  07/11/2014   BMET Lab Results  Component Value Date   NA 141 07/11/2014   K 3.9 07/11/2014   CL 108 07/11/2014   CO2 27 07/11/2014   GLUCOSE 76 07/11/2014   BUN 17 07/11/2014   CREATININE 0.92 07/11/2014   CALCIUM 10.7* 07/11/2014   GFRNONAA 88 07/11/2014   GFRAA >89 07/11/2014     Assessment and Plan  hiv = well controlled, continue on truvada/DRVr  htn = well controled on amlodipine 5mg   Seizure d/o = continue on keppra 500mg  bid  Copd = smokes 1.5PPD no intention on smoking cessation. Currently not on any inhalers. Will [facility] to [facility] care.

## 2015-01-02 ENCOUNTER — Ambulatory Visit: Payer: Medicaid Other | Admitting: Internal Medicine

## 2015-01-23 ENCOUNTER — Other Ambulatory Visit: Payer: Self-pay | Admitting: Internal Medicine

## 2015-01-23 DIAGNOSIS — B20 Human immunodeficiency virus [HIV] disease: Secondary | ICD-10-CM

## 2015-02-27 ENCOUNTER — Other Ambulatory Visit: Payer: Self-pay | Admitting: Internal Medicine

## 2015-02-27 ENCOUNTER — Telehealth: Payer: Self-pay | Admitting: *Deleted

## 2015-02-27 DIAGNOSIS — B20 Human immunodeficiency virus [HIV] disease: Secondary | ICD-10-CM

## 2015-02-27 DIAGNOSIS — R569 Unspecified convulsions: Secondary | ICD-10-CM

## 2015-02-27 NOTE — Telephone Encounter (Signed)
Spoke with patient regarding refills, need for follow up appointment.  He states transportation is an issue.  Scheduled him with Dr. Storm Frisk first available (April, 2016), authorized refills through then. Patient has adequate time to coordinate a ride to the clinic. He will need labs at that appointment. Landis Gandy, RN

## 2015-04-11 ENCOUNTER — Ambulatory Visit: Payer: Medicaid Other | Admitting: Internal Medicine

## 2015-05-20 ENCOUNTER — Other Ambulatory Visit: Payer: Self-pay | Admitting: Internal Medicine

## 2015-05-20 DIAGNOSIS — B20 Human immunodeficiency virus [HIV] disease: Secondary | ICD-10-CM

## 2015-05-20 DIAGNOSIS — R569 Unspecified convulsions: Secondary | ICD-10-CM

## 2015-06-20 ENCOUNTER — Other Ambulatory Visit: Payer: Self-pay | Admitting: Internal Medicine

## 2015-06-20 DIAGNOSIS — B20 Human immunodeficiency virus [HIV] disease: Secondary | ICD-10-CM

## 2015-06-21 NOTE — Telephone Encounter (Signed)
Patient has an upcoming appointment with Dr. Baxter Flattery Tuesday, July 18, 2015 at 10:45AM.

## 2015-07-14 ENCOUNTER — Other Ambulatory Visit: Payer: Self-pay | Admitting: Internal Medicine

## 2015-07-18 ENCOUNTER — Ambulatory Visit: Payer: Medicaid Other | Admitting: Internal Medicine

## 2015-08-10 ENCOUNTER — Other Ambulatory Visit: Payer: Self-pay | Admitting: Internal Medicine

## 2015-08-10 DIAGNOSIS — B20 Human immunodeficiency virus [HIV] disease: Secondary | ICD-10-CM

## 2015-08-10 NOTE — Telephone Encounter (Signed)
Patient must make and keep an appointment for additional refills per [facility] policy.

## 2015-08-23 ENCOUNTER — Other Ambulatory Visit: Payer: Self-pay | Admitting: Internal Medicine

## 2015-09-12 ENCOUNTER — Other Ambulatory Visit: Payer: Self-pay | Admitting: Internal Medicine

## 2015-10-24 ENCOUNTER — Other Ambulatory Visit: Payer: Self-pay | Admitting: Internal Medicine

## 2015-10-31 ENCOUNTER — Ambulatory Visit: Payer: Medicaid Other | Admitting: Internal Medicine

## 2015-11-02 ENCOUNTER — Other Ambulatory Visit: Payer: Self-pay | Admitting: Internal Medicine

## 2015-12-05 ENCOUNTER — Ambulatory Visit (INDEPENDENT_AMBULATORY_CARE_PROVIDER_SITE_OTHER): Payer: Medicare Other | Admitting: Internal Medicine

## 2015-12-05 ENCOUNTER — Encounter: Payer: Self-pay | Admitting: Internal Medicine

## 2015-12-05 VITALS — BP 135/84 | HR 98 | Temp 98.6°F | Wt 150.0 lb

## 2015-12-05 DIAGNOSIS — B20 Human immunodeficiency virus [HIV] disease: Secondary | ICD-10-CM

## 2015-12-05 DIAGNOSIS — I10 Essential (primary) hypertension: Secondary | ICD-10-CM

## 2015-12-05 DIAGNOSIS — R569 Unspecified convulsions: Secondary | ICD-10-CM

## 2015-12-05 DIAGNOSIS — Z23 Encounter for immunization: Secondary | ICD-10-CM | POA: Diagnosis not present

## 2015-12-05 LAB — COMPLETE METABOLIC PANEL WITH GFR
ALBUMIN: 3.7 g/dL (ref 3.6–5.1)
ALK PHOS: 104 U/L (ref 40–115)
ALT: 40 U/L (ref 9–46)
AST: 40 U/L — AB (ref 10–35)
BUN: 11 mg/dL (ref 7–25)
CHLORIDE: 112 mmol/L — AB (ref 98–110)
CO2: 25 mmol/L (ref 20–31)
Calcium: 9.6 mg/dL (ref 8.6–10.3)
Creat: 0.98 mg/dL (ref 0.70–1.25)
GFR, EST NON AFRICAN AMERICAN: 81 mL/min (ref >=60)
GFR, Est African American: >89 mL/min (ref >=60)
GLUCOSE: 79 mg/dL (ref 65–99)
POTASSIUM: 4 mmol/L (ref 3.5–5.3)
SODIUM: 143 mmol/L (ref 135–146)
Total Bilirubin: 0.3 mg/dL (ref 0.2–1.2)
Total Protein: 6.8 g/dL (ref 6.1–8.1)

## 2015-12-05 MED ORDER — ELVITEG-COBIC-EMTRICIT-TENOFAF 150-150-200-10 MG PO TABS
1.0000 | ORAL_TABLET | Freq: Every day | ORAL | Status: DC
Start: 2015-12-05 — End: 2017-08-07

## 2015-12-05 MED ORDER — AMLODIPINE BESYLATE 5 MG PO TABS: 5.0000 mg | ORAL_TABLET | Freq: Every day | ORAL | Status: DC

## 2015-12-05 MED ORDER — DARUNAVIR ETHANOLATE 800 MG PO TABS: 800.0000 mg | ORAL_TABLET | Freq: Every day | ORAL | Status: DC

## 2015-12-05 MED ORDER — LEVETIRACETAM 500 MG PO TABS: 500.0000 mg | ORAL_TABLET | Freq: Two times a day (BID) | ORAL | Status: DC

## 2015-12-05 NOTE — Progress Notes (Signed)
[name], male   DOB: [birth year], 64 y.o.   MRN: [id number]      RFV: hiv disease routine follow up  [name], male   DOB: 27-Mar-1951, 64 y.o.   MRN: [id number]  HPI [name] is a 64yo M with HIV disease, hx of pcp, seizure d/o, CD 4 cont of 490/VL< 20 (july 2016) on truvada/DRVr. Also on low dose amlodipine for HTN. He reports running out of meds mid November and did not come to clinic sooner for refills. He stopped taking his medicaitons all together and did not space them out. He continues to take his seizure medication and antihypertensive. He states that he is otherwise in good health  Outpatient Encounter Prescriptions as of 12/05/2015  Medication Sig  . amLODipine (NORVASC) 5 MG tablet TAKE 1 TABLET (5 MG TOTAL) BY MOUTH DAILY.  Marland Kitchen ENSURE (ENSURE) Take 237 mLs by mouth 2 (two) times daily between meals.  . levETIRAcetam (KEPPRA) 500 MG tablet TAKE 1 TABLET (500 MG TOTAL) BY MOUTH EVERY 12 HOURS.  . NORVIR 100 MG TABS tablet TAKE 1 TABLET (100 MG TOTAL) BY MOUTH DAILY WITH BREAKFAST.  Marland Kitchen PREZISTA 800 MG tablet TAKE 1 TABLET (800 MG TOTAL) BY MOUTH DAILY WITH BREAKFAST.  . TRUVADA 200-300 MG per tablet TAKE 1 TABLET BY MOUTH DAILY.  . valACYclovir (VALTREX) 1000 MG tablet TAKE 1 TABLET (1,000 MG TOTAL) BY MOUTH DAILY.   No facility-administered encounter medications on file as of 12/05/2015.     Patient Active Problem List   Diagnosis Date Noted  . ERECTILE DYSFUNCTION 04/13/2008  . GLAUCOMA NOS 08/19/2007  . DENTAL CARIES 08/19/2007  . HIV DISEASE 10/22/2006  . 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  . PNEUMOCYSTIS PNEUMONIA 10/22/2006  . COPD 10/22/2006  . SEIZURE DISORDER 10/22/2006     Health Maintenance Due  Topic Date Due  . TETANUS/TDAP  05/18/1969  . COLONOSCOPY  05/18/2000  . ZOSTAVAX  05/18/2010  . PNA vac Low Risk Adult (1 of 2 - PCV13) 05/19/2015  . INFLUENZA VACCINE  07/31/2015     Review of Systems 10 point ros has been reviewed, no  positive pertinents Physical Exam   BP 135/84 mmHg  Pulse 98  Temp(Src) 98.6 F (37 C) (Oral)  Wt 150 lb (68.04 kg) Physical Exam  Constitutional: He is oriented to person, place, and time. He appears well-developed and well-nourished. No distress. Older than stated age HENT:  Mouth/Throat: Oropharynx is clear and moist. No oropharyngeal exudate.  Cardiovascular: Normal rate, regular rhythm and normal heart sounds. Exam reveals no gallop and no friction rub.  No murmur heard.  Pulmonary/Chest: Effort normal and breath sounds normal. No respiratory distress. He has no wheezes.  Lymphadenopathy:  He has no cervical adenopathy.  Neurological: He is alert and oriented to person, place, and time.  Skin: Skin is warm and dry. No rash noted. No erythema.  Psychiatric: He has a normal mood and affect. His behavior is normal.     Lab Results  Component Value Date   CD4TCELL 22* 07/11/2014   Lab Results  Component Value Date   CD4TABS 490 07/11/2014   CD4TABS 370* 04/02/2013   CD4TABS 1010 01/30/2012   Lab Results  Component Value Date   HIV1RNAQUANT <20 07/11/2014   Lab Results  Component Value Date   HEPBSAB NEG 02/13/2012   No results found for: RPR  CBC Lab Results  Component Value Date   WBC 4.6 07/11/2014   RBC 4.99 07/11/2014  HGB 15.6 07/11/2014   HCT 44.8 07/11/2014   PLT 114* 07/11/2014   MCV 89.8 07/11/2014   MCH 31.3 07/11/2014   MCHC 34.8 07/11/2014   RDW 13.5 07/11/2014   LYMPHSABS 2.2 07/11/2014   MONOABS 0.5 07/11/2014   EOSABS 0.1 07/11/2014   BASOSABS 0.0 07/11/2014   BMET Lab Results  Component Value Date   NA 141 07/11/2014   K 3.9 07/11/2014   CL 108 07/11/2014   CO2 27 07/11/2014   GLUCOSE 76 07/11/2014   BUN 17 07/11/2014   CREATININE 0.92 07/11/2014   CALCIUM 10.7* 07/11/2014   GFRNONAA 88 07/11/2014   GFRAA >89 07/11/2014     Assessment and Plan  hiv disease = will do labs today with genotype.  Will change him to genvoya  plus DRV  Seizure disorder = will refill keppra  htn = well controlled. No change in dosage. Will refill  Health maintenance = will check rpr and also give flu vax

## 2015-12-06 LAB — CBC WITH DIFFERENTIAL/PLATELET
BASOS PCT: 1 % (ref 0–1)
Basophils Absolute: 0 10*3/uL (ref 0.0–0.1)
EOS ABS: 0 10*3/uL (ref 0.0–0.7)
Eosinophils Relative: 1 % (ref 0–5)
HCT: 46.5 % (ref 39.0–52.0)
HEMOGLOBIN: 16 g/dL (ref 13.0–17.0)
Lymphocytes Relative: 46 % (ref 12–46)
Lymphs Abs: 1.9 10*3/uL (ref 0.7–4.0)
MCH: 31.3 pg (ref 26.0–34.0)
MCHC: 34.4 g/dL (ref 30.0–36.0)
MCV: 90.8 fL (ref 78.0–100.0)
MONO ABS: 0.7 10*3/uL (ref 0.1–1.0)
MONOS PCT: 17 % — AB (ref 3–12)
NEUTROS ABS: 1.5 10*3/uL — AB (ref 1.7–7.7)
NEUTROS PCT: 35 % — AB (ref 43–77)
PLATELETS: 61 10*3/uL — AB (ref 150–400)
RBC: 5.12 MIL/uL (ref 4.22–5.81)
RDW: 13.1 % (ref 11.5–15.5)
WBC: 4.2 10*3/uL (ref 4.0–10.5)

## 2015-12-06 LAB — T-HELPER CELL (CD4) - (RCID CLINIC ONLY)
CD4 % Helper T Cell: 12 % — ABNORMAL LOW (ref 33–55)
CD4 T Cell Abs: 250 /uL — ABNORMAL LOW (ref 400–2700)

## 2015-12-06 LAB — HIV-1 RNA ULTRAQUANT REFLEX TO GENTYP+
HIV 1 RNA QUANT: 150753 {copies}/mL — AB (ref <20)
HIV-1 RNA QUANT, LOG: 5.18 {Log_copies}/mL — AB (ref <1.30)

## 2015-12-06 LAB — RPR

## 2015-12-06 LAB — PATHOLOGIST SMEAR REVIEW

## 2015-12-13 LAB — HIV-1 GENOTYPR PLUS

## 2016-01-18 ENCOUNTER — Ambulatory Visit: Payer: Medicaid Other | Admitting: Internal Medicine

## 2016-02-26 ENCOUNTER — Ambulatory Visit: Payer: Medicaid Other | Admitting: Internal Medicine

## 2016-03-14 ENCOUNTER — Ambulatory Visit: Payer: Medicare Other | Admitting: Internal Medicine

## 2016-11-15 ENCOUNTER — Other Ambulatory Visit: Payer: Self-pay | Admitting: Internal Medicine

## 2016-11-15 DIAGNOSIS — R569 Unspecified convulsions: Secondary | ICD-10-CM

## 2016-11-15 DIAGNOSIS — B20 Human immunodeficiency virus [HIV] disease: Secondary | ICD-10-CM

## 2016-11-25 ENCOUNTER — Other Ambulatory Visit: Payer: Self-pay | Admitting: Internal Medicine

## 2016-11-25 DIAGNOSIS — B20 Human immunodeficiency virus [HIV] disease: Secondary | ICD-10-CM

## 2016-11-25 DIAGNOSIS — R569 Unspecified convulsions: Secondary | ICD-10-CM

## 2016-11-26 ENCOUNTER — Telehealth: Payer: Self-pay | Admitting: *Deleted

## 2016-11-26 NOTE — Telephone Encounter (Signed)
Pharamcy requesting refills. Refills were denied last month, requesting patient to contact office - he has not. Patient last seen 11/2015, was off medication, was instructed to follow up January 2017 - no showed. No showed again February 2017 and March 2017.  RN attempted to call, phone call answered and immediately disconnected twice.  RN called 3rd time, left voicemail notifying patient that he must call the office. Landis Gandy, RN

## 2017-02-24 ENCOUNTER — Other Ambulatory Visit: Payer: Self-pay | Admitting: Internal Medicine

## 2017-02-24 DIAGNOSIS — I10 Essential (primary) hypertension: Secondary | ICD-10-CM

## 2017-02-24 DIAGNOSIS — B20 Human immunodeficiency virus [HIV] disease: Secondary | ICD-10-CM

## 2017-02-24 DIAGNOSIS — R569 Unspecified convulsions: Secondary | ICD-10-CM

## 2017-02-25 ENCOUNTER — Telehealth: Payer: Self-pay | Admitting: *Deleted

## 2017-02-25 NOTE — Telephone Encounter (Signed)
Patient called because his medication was denied at pharmacy. He has not been seen since 11/2015 and is having transportation issues. Patient stated he has been off his meds for a month. [name] and he has not had a refill since 09/2016. Advised patient to call Medicaid transportation and then to call us back to be scheduled an appt. He agreed to do this.

## 2017-08-07 ENCOUNTER — Encounter (HOSPITAL_COMMUNITY): Payer: Self-pay

## 2017-08-07 ENCOUNTER — Emergency Department (HOSPITAL_COMMUNITY)
Admission: EM | Admit: 2017-08-07 | Discharge: 2017-08-07 | Disposition: A | Discharge status: Home / Self Care | Payer: Medicare Other | Attending: Emergency Medicine | Admitting: Emergency Medicine

## 2017-08-07 DIAGNOSIS — B20 Human immunodeficiency virus [HIV] disease: Secondary | ICD-10-CM | POA: Insufficient documentation

## 2017-08-07 DIAGNOSIS — R569 Unspecified convulsions: Secondary | ICD-10-CM

## 2017-08-07 DIAGNOSIS — B49 Unspecified mycosis: Secondary | ICD-10-CM

## 2017-08-07 DIAGNOSIS — R21 Rash and other nonspecific skin eruption: Secondary | ICD-10-CM | POA: Diagnosis present

## 2017-08-07 DIAGNOSIS — F17210 Nicotine dependence, cigarettes, uncomplicated: Secondary | ICD-10-CM | POA: Diagnosis not present

## 2017-08-07 DIAGNOSIS — B359 Dermatophytosis, unspecified: Secondary | ICD-10-CM | POA: Diagnosis not present

## 2017-08-07 HISTORY — DX: Human immunodeficiency virus (HIV) disease: B20

## 2017-08-07 HISTORY — DX: Asymptomatic human immunodeficiency virus (hiv) infection status: Z21

## 2017-08-07 LAB — COMPREHENSIVE METABOLIC PANEL
ALBUMIN: 2.8 g/dL — AB (ref 3.5–5.0)
ALT: 24 U/L (ref 17–63)
AST: 28 U/L (ref 15–41)
Alkaline Phosphatase: 72 U/L (ref 38–126)
BUN: 16 mg/dL (ref 6–20)
CALCIUM: 10.7 mg/dL — AB (ref 8.9–10.3)
CHLORIDE: 109 mmol/L (ref 101–111)
CO2: 26 mmol/L (ref 22–32)
CREATININE: 0.84 mg/dL (ref 0.61–1.24)
GFR calc Af Amer: >60 mL/min (ref >60)
GFR calc non Af Amer: >60 mL/min (ref >60)
Glucose, Bld: 92 mg/dL (ref 65–99)
POTASSIUM: 4 mmol/L (ref 3.5–5.1)
Sodium: 137 mmol/L (ref 135–145)
Total Bilirubin: 0.4 mg/dL (ref 0.3–1.2)
Total Protein: 7.4 g/dL (ref 6.5–8.1)

## 2017-08-07 LAB — CBC WITH DIFFERENTIAL/PLATELET
Basophils Absolute: 0.1 10*3/uL (ref 0.0–0.1)
Basophils Relative: 2 %
Eosinophils Absolute: 0.3 10*3/uL (ref 0.0–0.7)
Eosinophils Relative: 5 %
HCT: 36.9 % — ABNORMAL LOW (ref 39.0–52.0)
Hemoglobin: 12 g/dL — ABNORMAL LOW (ref 13.0–17.0)
Lymphocytes Relative: 37 %
Lymphs Abs: 2.5 10*3/uL (ref 0.7–4.0)
MCH: 28.6 pg (ref 26.0–34.0)
MCHC: 32.5 g/dL (ref 30.0–36.0)
MCV: 88.1 fL (ref 78.0–100.0)
Monocytes Absolute: 1 10*3/uL (ref 0.1–1.0)
Monocytes Relative: 16 %
Neutro Abs: 2.7 10*3/uL (ref 1.7–7.7)
Neutrophils Relative %: 41 %
Platelets: 114 10*3/uL — ABNORMAL LOW (ref 150–400)
RBC: 4.19 MIL/uL — ABNORMAL LOW (ref 4.22–5.81)
RDW: 14.4 % (ref 11.5–15.5)
WBC: 6.6 10*3/uL (ref 4.0–10.5)

## 2017-08-07 MED ORDER — NYSTATIN 100000 UNIT/GM EX CREA
TOPICAL_CREAM | Freq: Two times a day (BID) | CUTANEOUS | Status: DC
  Administered 2017-08-07: 1 via TOPICAL
  Filled 2017-08-07: qty 15

## 2017-08-07 MED ORDER — NYSTATIN-TRIAMCINOLONE 100000-0.1 UNIT/GM-% EX CREA: TOPICAL_CREAM | CUTANEOUS | 2 refills | Status: DC

## 2017-08-07 MED ORDER — LEVETIRACETAM 500 MG PO TABS: 500.0000 mg | ORAL_TABLET | Freq: Two times a day (BID) | ORAL | 11 refills | Status: DC

## 2017-08-07 NOTE — ED Provider Notes (Signed)
[place] DEPT Provider Note   CSN: [id number] Arrival date & time: 08/07/17  0849     History   Chief Complaint Chief Complaint  Patient presents with  . Abscess    HPI [name] is a 65 y.o. male.   Rash   This is a new problem. The current episode started more than 1 week ago. The problem has not changed since onset.The problem is associated with nothing. There has been no fever. The rash is present on the genitalia. The pain is mild (burning pain only). The pain has been constant since onset. Associated symptoms include itching. Treatments tried: vaseline. The treatment provided significant relief.    Past Medical History:  Diagnosis Date  . HIV (human immunodeficiency virus infection) [facility])     Patient Active Problem List   Diagnosis Date Noted  . ERECTILE DYSFUNCTION 04/13/2008  . GLAUCOMA NOS 08/19/2007  . DENTAL CARIES 08/19/2007  . HIV DISEASE 10/22/2006  . 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  . PNEUMOCYSTIS PNEUMONIA 10/22/2006  . COPD 10/22/2006  . SEIZURE DISORDER 10/22/2006    History reviewed. No pertinent surgical history.     Home Medications    Prior to Admission medications   Medication Sig Start Date End Date Taking? Authorizing Provider  levETIRAcetam (KEPPRA) 500 MG tablet Take 1 tablet (500 mg total) by mouth 2 (two) times daily. 08/07/17   Markesha Hannig, Corene Cornea, MD  nystatin-triamcinolone [facility] II) cream Apply to affected area daily until 3 days after rash clears 08/07/17   Amya Hlad, Corene Cornea, MD    Family History No family history on file.  Social History Social History  Substance Use Topics  . Smoking status: Current Every Day Smoker    Packs/day: 1.50    Types: Cigarettes  . Smokeless tobacco: Never Used  . Alcohol use No     Allergies   Penicillins   Review of Systems Review of Systems  Constitutional: Negative for fever.  Skin: Positive for itching and rash.  All other systems reviewed and are negative.    Physical  Exam Updated Vital Signs BP 109/76   Pulse 83   Temp 97.9 F (36.6 C) (Oral)   Resp 16   Ht 5\' 11"  (1.803 m)   Wt 63.5 kg (140 lb)   SpO2 97%   BMI 19.53 kg/m   Physical Exam  Constitutional: He is oriented to person, place, and time. He appears well-developed and well-nourished.  HENT:  Head: Normocephalic and atraumatic.  Eyes: Conjunctivae and EOM are normal.  Neck: Normal range of motion.  Cardiovascular: Normal rate.   Pulmonary/Chest: Effort normal. No respiratory distress.  Abdominal: Soft. He exhibits no distension.  Genitourinary:  Genitourinary Comments: Indurated area under scrotum that is non tender but is excoriated/denuded with satellite lesions. Not warm or significantly erythematous. No fluctuance.   Musculoskeletal: Normal range of motion.  Neurological: He is alert and oriented to person, place, and time.  Skin: Skin is warm and dry.  Nursing note and vitals reviewed.    ED Treatments / Results  Labs (all labs ordered are listed, but only abnormal results are displayed) Labs Reviewed  CBC WITH DIFFERENTIAL/PLATELET - Abnormal; Notable for the following:       Result Value   RBC 4.19 (*)    Hemoglobin 12.0 (*)    HCT 36.9 (*)    Platelets 114 (*)    All other components within normal limits  COMPREHENSIVE METABOLIC PANEL - Abnormal; Notable for the following:    Calcium 10.7 (*)  Albumin 2.8 (*)    All other components within normal limits  T-HELPER CELLS (CD4) COUNT (NOT AT [facility])  HIV-1 RNA ULTRAQUANT REFLEX TO GENTYP+    EKG  EKG Interpretation None       Radiology No results found.  Procedures Procedures (including critical care time)  Medications Ordered in ED Medications  nystatin cream (MYCOSTATIN) (1 application Topical Given 08/07/17 1108)     Initial Impression / Assessment and Plan / ED Course  I have reviewed the triage vital signs and the nursing notes.  Pertinent labs & imaging results that were available during my  care of the patient were reviewed by me and considered in my medical decision making (see chart for details).     Suspect fungal infection rather than abscess or cellulitis. Immune competent, has fu scheduled with ID doc in a couple weeks to get back on meds. Refilled his keppra here. Will defer to ID to restart HAART.   Final Clinical Impressions(s) / ED Diagnoses   Final diagnoses:  Fungal infection    New Prescriptions Discharge Medication List as of 08/07/2017 10:40 AM    START taking these medications   Details  nystatin-triamcinolone (MYCOLOG II) cream Apply to affected area daily until 3 days after rash clears, Print         Divya Munshi, Corene Cornea, MD 08/07/17 1151

## 2017-08-07 NOTE — ED Triage Notes (Signed)
Pt reports abscess to left groin area x 1 1/2 weeks.

## 2017-08-08 LAB — T-HELPER CELLS (CD4) COUNT (NOT AT ARMC)
CD4 T CELL ABS: 60 /uL — AB (ref 400–2700)
CD4 T CELL HELPER: 3 % — AB (ref 33–55)

## 2017-09-05 ENCOUNTER — Encounter (HOSPITAL_COMMUNITY): Payer: Self-pay | Admitting: Emergency Medicine

## 2017-09-05 ENCOUNTER — Emergency Department (HOSPITAL_COMMUNITY)
Admission: EM | Admit: 2017-09-05 | Discharge: 2017-09-05 | Disposition: A | Discharge status: Home / Self Care | Payer: Medicare Other | Attending: Emergency Medicine | Admitting: Emergency Medicine

## 2017-09-05 DIAGNOSIS — B20 Human immunodeficiency virus [HIV] disease: Secondary | ICD-10-CM | POA: Diagnosis not present

## 2017-09-05 DIAGNOSIS — R21 Rash and other nonspecific skin eruption: Secondary | ICD-10-CM | POA: Diagnosis present

## 2017-09-05 DIAGNOSIS — F17210 Nicotine dependence, cigarettes, uncomplicated: Secondary | ICD-10-CM | POA: Insufficient documentation

## 2017-09-05 MED ORDER — SULFAMETHOXAZOLE-TRIMETHOPRIM 800-160 MG PO TABS: 1.0000 | ORAL_TABLET | Freq: Every day | ORAL | 1 refills | Status: AC

## 2017-09-05 MED ORDER — FLUCONAZOLE 200 MG PO TABS: 100.0000 mg | ORAL_TABLET | Freq: Every day | ORAL | 0 refills | Status: AC

## 2017-09-05 NOTE — Discharge Instructions (Signed)
Your rash is nonspecific but could be related to a fungal infection, it might also be related to the underlying HIV as it can often cause rashes. Your blood counts are very low so he will need to start taking Bactrim once a day, you should also take the fluconazole once a day for the next 2 weeks to help with the rash. If it does not help that tell you that the rash is probably related to the HIV.

## 2017-09-05 NOTE — ED Triage Notes (Signed)
Patient c/o rash in left groin. Per patient seen here for same reason x1 month. Patient states given prescription for cream and "pills." Per patient able to get "pills filled but not cream/ointment." Per chart review patie nt diagnosed with fungal infection and prescribed nystatin-triamcinolone cream.

## 2017-09-05 NOTE — ED Notes (Signed)
Lab in obtaining lab specimen

## 2017-09-05 NOTE — ED Provider Notes (Signed)
[place] DEPT Provider Note   CSN: [id number] Arrival date & time: 09/05/17  1011     History   Chief Complaint Chief Complaint  Patient presents with  . Rash    HPI [name] is a 66 y.o. male.  HPI  The patient is a 66 year old male, known history of HIV, not currently on medications, has no complaints today other than a rash in his groin bilaterally. He was seen for the same thing approximately one month ago but when he went to the pharmacy to have his medication filled he was told that his insurance would not pay for this medication. He states that he currently lives by himself, he does lots of walking but he has no other way to get around including transportation to the infectious disease clinic. A nurse came to his house for further evaluation, he was given some pills for something though he does not recall what it was and states that it did not get rid of his rash. He reports that the rash is mildly itchy occasionally but overall does not bother him other than its appearance. He denies shortness of breath chest pain coughing abdominal pain nausea vomiting diarrhea or swelling of the legs. There is no headaches blurred vision or other neurologic symptoms. Based on lab work that was obtained approximately 4 weeks ago the patient had a CD4 count of 60, CBC was unremarkable, metabolic panel was unremarkable except for a low albumin.  The patient's viral load was approximately 600,000.  Denies dysuria  Past Medical History:  Diagnosis Date  . HIV (human immunodeficiency virus infection) [facility])     Patient Active Problem List   Diagnosis Date Noted  . ERECTILE DYSFUNCTION 04/13/2008  . GLAUCOMA NOS 08/19/2007  . DENTAL CARIES 08/19/2007  . HIV DISEASE 10/22/2006  . 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  . PNEUMOCYSTIS PNEUMONIA 10/22/2006  . COPD 10/22/2006  . SEIZURE DISORDER 10/22/2006    History reviewed. No pertinent surgical history.     Home Medications     Prior to Admission medications   Medication Sig Start Date End Date Taking? Authorizing Provider  fluconazole (DIFLUCAN) 200 MG tablet Take 0.5 tablets (100 mg total) by mouth daily. 09/05/17 09/19/17  Noemi Chapel, MD  levETIRAcetam (KEPPRA) 500 MG tablet Take 1 tablet (500 mg total) by mouth 2 (two) times daily. 08/07/17   Mesner, Corene Cornea, MD  nystatin-triamcinolone [facility] II) cream Apply to affected area daily until 3 days after rash clears 08/07/17   Mesner, Corene Cornea, MD  sulfamethoxazole-trimethoprim (BACTRIM DS,SEPTRA DS) 800-160 MG tablet Take 1 tablet by mouth daily. 09/05/17 10/05/17  Noemi Chapel, MD    Family History History reviewed. No pertinent family history.  Social History Social History  Substance Use Topics  . Smoking status: Current Every Day Smoker    Packs/day: 1.50    Types: Cigarettes  . Smokeless tobacco: Never Used  . Alcohol use No     Allergies   Penicillins   Review of Systems Review of Systems  All other systems reviewed and are negative.    Physical Exam Updated Vital Signs BP 101/73 (BP Location: Left Arm)   Pulse (!) 102   Temp 98.6 F (37 C) (Oral)   Resp 16   Ht 5\' 11"  (1.803 m)   Wt 63.5 kg (140 lb)   SpO2 97%   BMI 19.53 kg/m   Physical Exam  Constitutional: He appears well-developed and well-nourished. No distress.  HENT:  Head: Normocephalic and atraumatic.  Mouth/Throat: Oropharynx  is clear and moist. No oropharyngeal exudate.  Eyes: Pupils are equal, round, and reactive to light. Conjunctivae and EOM are normal. Right eye exhibits no discharge. Left eye exhibits no discharge. No scleral icterus.  Neck: Normal range of motion. Neck supple. No JVD present. No thyromegaly present.  Cardiovascular: Regular rhythm, normal heart sounds and intact distal pulses.  Exam reveals no gallop and no friction rub.   No murmur heard. Pulse of 102 on my exam, normal heart sounds, no murmurs  Pulmonary/Chest: Effort normal and breath sounds  normal. No respiratory distress. He has no wheezes. He has no rales.  Abdominal: Soft. Bowel sounds are normal. He exhibits no distension and no mass. There is no tenderness.  Genitourinary:  Genitourinary Comments: Normal appearing penis and scrotum, has multiple raised roundareas in the bilateral proximal thighs and inguinal regions with minimal  Bilateral nontender lymphadenopathy. There is no petechiae or purpura, no vesicles or pustules, no drainage, no scabbing  Musculoskeletal: Normal range of motion. He exhibits no edema or tenderness.  Lymphadenopathy:    He has no cervical adenopathy.  Neurological: He is alert. Coordination normal.  Skin: Skin is warm and dry. No rash noted. No erythema.  Psychiatric: He has a normal mood and affect. His behavior is normal.  Nursing note and vitals reviewed.    ED Treatments / Results  Labs (all labs ordered are listed, but only abnormal results are displayed) Labs Reviewed  RPR     Radiology No results found.  Procedures Procedures (including critical care time)  Medications Ordered in ED Medications - No data to display   Initial Impression / Assessment and Plan / ED Course  I have reviewed the triage vital signs and the nursing notes.  Pertinent labs & imaging results that were available during my care of the patient were reviewed by me and considered in my medical decision making (see chart for details).   The areas of the rash appear to be benign likely related to underlying HIV, possibly fungal though there is minimal scratching. We'll attempt fluconazole as an alternative to the topical cream that he was given last time. The patient does not appear in distress, he was cautioned again that he needs to follow-up with the HIV clinic. He continues to report that getting to that clinic is difficult for him though he does state that he has a brother that lives nearby, he insists that his Medicaid insurance should pay for  transportation but is not sure that it will. He is in no distress, he does not have a fever, he has no respiratory symptoms, I doubt that he has an underlying aggressive bacterial or immunocompromised infection. He was given the indications for return and expressed his understanding.  There is no rash to the palms or the soles, the rash seems to be contained to the groin, I do not see that he has been tested for syphilis, will obtain an RPR, he will be treated with Bactrim as a prophylactic dose given his low CD4 count.  Final Clinical Impressions(s) / ED Diagnoses   Final diagnoses:  Rash of groin    New Prescriptions New Prescriptions   FLUCONAZOLE (DIFLUCAN) 200 MG TABLET    Take 0.5 tablets (100 mg total) by mouth daily.   SULFAMETHOXAZOLE-TRIMETHOPRIM (BACTRIM DS,SEPTRA DS) 800-160 MG TABLET    Take 1 tablet by mouth daily.     Noemi Chapel, MD 09/05/17 1040

## 2017-09-06 LAB — RPR: RPR Ser Ql: NONREACTIVE

## 2017-09-09 LAB — REFLEX TO GENOSURE(R) MG

## 2017-09-09 LAB — HIV-1 RNA ULTRAQUANT REFLEX TO GENTYP+
HIV-1 RNA BY PCR: 612000 copies/mL
HIV-1 RNA Quant, Log: 5.787 log10copy/mL

## 2017-09-27 ENCOUNTER — Encounter (HOSPITAL_COMMUNITY): Payer: Self-pay

## 2017-09-27 ENCOUNTER — Emergency Department (HOSPITAL_COMMUNITY)
Admission: EM | Admit: 2017-09-27 | Discharge: 2017-09-27 | Disposition: A | Discharge status: Home / Self Care | Payer: Medicare Other | Attending: Emergency Medicine | Admitting: Emergency Medicine

## 2017-09-27 DIAGNOSIS — Z79899 Other long term (current) drug therapy: Secondary | ICD-10-CM | POA: Insufficient documentation

## 2017-09-27 DIAGNOSIS — R21 Rash and other nonspecific skin eruption: Secondary | ICD-10-CM | POA: Diagnosis present

## 2017-09-27 DIAGNOSIS — F17210 Nicotine dependence, cigarettes, uncomplicated: Secondary | ICD-10-CM | POA: Diagnosis not present

## 2017-09-27 DIAGNOSIS — J449 Chronic obstructive pulmonary disease, unspecified: Secondary | ICD-10-CM | POA: Insufficient documentation

## 2017-09-27 DIAGNOSIS — B20 Human immunodeficiency virus [HIV] disease: Secondary | ICD-10-CM | POA: Insufficient documentation

## 2017-09-27 MED ORDER — NYSTATIN 100000 UNIT/GM EX CREA: 1.0000 "application " | TOPICAL_CREAM | Freq: Two times a day (BID) | CUTANEOUS | 0 refills | Status: DC

## 2017-09-27 MED ORDER — TRIAMCINOLONE ACETONIDE 0.1 % EX CREA: 1.0000 "application " | TOPICAL_CREAM | Freq: Two times a day (BID) | CUTANEOUS | 0 refills | Status: DC

## 2017-09-27 NOTE — ED Triage Notes (Signed)
Pt reports chronic rash to his groin off and on for several months, states he was seen here for same and told it was possibly due to his blood counts be low with his HIV.

## 2017-09-27 NOTE — Discharge Instructions (Signed)
Mix and equal part of each cream prescribed together, and apply to your rash twice daily until resolved.

## 2017-09-28 NOTE — ED Provider Notes (Signed)
[place] DEPT Provider Note   CSN: [id number] Arrival date & time: 09/27/17  1943     History   Chief Complaint Chief Complaint  Patient presents with  . Rash    HPI [name] is a 66 y.o. male with a history of HIV, last CD4 count about 6 weeks ago ws 52, with no current HIV treatment, genital herpes and COPD presenting with a persistent pruritic genital rash.  He was seen for this last month ago and was prescribed mycolog cream, but was unable to afford and was not covered by his insurance.  He denies fevers, chills, or other complaints.  He reports no transportation so has been unable to see  his infectious disease MD in [place].  The history is provided by the patient.    Past Medical History:  Diagnosis Date  . HIV (human immunodeficiency virus infection) [facility])     Patient Active Problem List   Diagnosis Date Noted  . ERECTILE DYSFUNCTION 04/13/2008  . GLAUCOMA NOS 08/19/2007  . DENTAL CARIES 08/19/2007  . HIV DISEASE 10/22/2006  . 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  . PNEUMOCYSTIS PNEUMONIA 10/22/2006  . COPD 10/22/2006  . SEIZURE DISORDER 10/22/2006    History reviewed. No pertinent surgical history.     Home Medications    Prior to Admission medications   Medication Sig Start Date End Date Taking? Authorizing Provider  levETIRAcetam (KEPPRA) 500 MG tablet Take 1 tablet (500 mg total) by mouth 2 (two) times daily. 08/07/17   Mesner, Corene Cornea, MD  nystatin cream (MYCOSTATIN) Apply 1 application topically 2 (two) times daily. 09/27/17   Evalee Jefferson, PA-C  nystatin-triamcinolone (MYCOLOG II) cream Apply to affected area daily until 3 days after rash clears 08/07/17   Mesner, Corene Cornea, MD  sulfamethoxazole-trimethoprim (BACTRIM DS,SEPTRA DS) 800-160 MG tablet Take 1 tablet by mouth daily. 09/05/17 10/05/17  Noemi Chapel, MD  triamcinolone cream (KENALOG) 0.1 % Apply 1 application topically 2 (two) times daily. 09/27/17   Evalee Jefferson, PA-C    Family History No family  history on file.  Social History Social History  Substance Use Topics  . Smoking status: Current Every Day Smoker    Packs/day: 1.50    Types: Cigarettes  . Smokeless tobacco: Never Used  . Alcohol use No     Allergies   Penicillins   Review of Systems Review of Systems  Constitutional: Negative for chills and fever.  Respiratory: Negative for shortness of breath and wheezing.   Skin: Positive for rash.  Neurological: Negative for numbness.     Physical Exam Updated Vital Signs BP 112/66 (BP Location: Right Arm)   Pulse 68   Temp 98.8 F (37.1 C) (Oral)   Resp 20   Ht 5\' 11"  (1.803 m)   Wt 63.5 kg (140 lb)   SpO2 98%   BMI 19.53 kg/m   Physical Exam  Constitutional: He appears well-developed and well-nourished. No distress.  HENT:  Head: Normocephalic.  Neck: Neck supple.  Cardiovascular: Normal rate.   Pulmonary/Chest: Effort normal. He has no wheezes.  Musculoskeletal: Normal range of motion. He exhibits no edema.  Skin: Rash noted.  Normal appearing penis and scrotum, has multiple raised round hyperkeratotic and darker macules in the bilateral proximal thighs and inguinal region.  No petechiae, purpura,  Vesicles, ulcers or pustules, no induration or red streaking.     ED Treatments / Results  Labs (all labs ordered are listed, but only abnormal results are displayed) Labs Reviewed - No data to display  EKG  EKG Interpretation None       Radiology No results found.  Procedures Procedures (including critical care time)  Medications Ordered in ED Medications - No data to display   Initial Impression / Assessment and Plan / ED Course  I have reviewed the triage vital signs and the nursing notes.  Pertinent labs & imaging results that were available during my care of the patient were reviewed by me and considered in my medical decision making (see chart for details).     Pt with nonspecific groin rash, given pruritic nature, suspect  probable fungal infection. He was unable to get the mycolog filled, not covered.  Individually these meds should be easily covered, prescribed nystatin and triamcinolone and advised to mix equal parts. Pt was also given resource information for establishing local care. He has no transportation to get to his infectious disease appts in [place], info for [facility] transportation given. Additionally, he was referred to [facility] for additional medical and social/supportive assistance.  Final Clinical Impressions(s) / ED Diagnoses   Final diagnoses:  Rash of groin    New Prescriptions Discharge Medication List as of 09/27/2017 10:11 PM    START taking these medications   Details  nystatin cream (MYCOSTATIN) Apply 1 application topically 2 (two) times daily., Starting Sat 09/27/2017, Print    triamcinolone cream (KENALOG) 0.1 % Apply 1 application topically 2 (two) times daily., Starting Sat 09/27/2017, Print         Evalee Jefferson, PA-C 09/29/17 [place], Coward, DO 10/01/17 1149

## 2017-11-07 ENCOUNTER — Emergency Department (HOSPITAL_COMMUNITY)
Admission: EM | Admit: 2017-11-07 | Discharge: 2017-11-07 | Disposition: A | Discharge status: Home / Self Care | Payer: Medicare HMO | Attending: Emergency Medicine | Admitting: Emergency Medicine

## 2017-11-07 ENCOUNTER — Other Ambulatory Visit: Payer: Self-pay | Admitting: Internal Medicine

## 2017-11-07 ENCOUNTER — Encounter (HOSPITAL_COMMUNITY): Payer: Self-pay | Admitting: Emergency Medicine

## 2017-11-07 DIAGNOSIS — B20 Human immunodeficiency virus [HIV] disease: Secondary | ICD-10-CM | POA: Diagnosis not present

## 2017-11-07 DIAGNOSIS — Z79899 Other long term (current) drug therapy: Secondary | ICD-10-CM | POA: Diagnosis not present

## 2017-11-07 DIAGNOSIS — R21 Rash and other nonspecific skin eruption: Secondary | ICD-10-CM | POA: Insufficient documentation

## 2017-11-07 DIAGNOSIS — F17210 Nicotine dependence, cigarettes, uncomplicated: Secondary | ICD-10-CM | POA: Diagnosis not present

## 2017-11-07 DIAGNOSIS — J449 Chronic obstructive pulmonary disease, unspecified: Secondary | ICD-10-CM | POA: Insufficient documentation

## 2017-11-07 LAB — CBC WITH DIFFERENTIAL/PLATELET
BASOS ABS: 0 10*3/uL (ref 0.0–0.1)
Basophils Relative: 0 %
EOS PCT: 10 %
Eosinophils Absolute: 0.7 10*3/uL (ref 0.0–0.7)
HEMATOCRIT: 32.2 % — AB (ref 39.0–52.0)
Hemoglobin: 10.2 g/dL — ABNORMAL LOW (ref 13.0–17.0)
Lymphocytes Relative: 26 %
Lymphs Abs: 1.8 10*3/uL (ref 0.7–4.0)
MCH: 29 pg (ref 26.0–34.0)
MCHC: 31.7 g/dL (ref 30.0–36.0)
MCV: 91.5 fL (ref 78.0–100.0)
MONOS PCT: 15 %
Monocytes Absolute: 1.1 10*3/uL — ABNORMAL HIGH (ref 0.1–1.0)
NEUTROS PCT: 49 %
Neutro Abs: 3.4 10*3/uL (ref 1.7–7.7)
PLATELETS: 168 10*3/uL (ref 150–400)
RBC: 3.52 MIL/uL — AB (ref 4.22–5.81)
RDW: 14.8 % (ref 11.5–15.5)
WBC: 7 10*3/uL (ref 4.0–10.5)

## 2017-11-07 LAB — COMPREHENSIVE METABOLIC PANEL
ALBUMIN: 2.3 g/dL — AB (ref 3.5–5.0)
ALT: 16 U/L — AB (ref 17–63)
AST: 24 U/L (ref 15–41)
Alkaline Phosphatase: 62 U/L (ref 38–126)
Anion gap: 4 — ABNORMAL LOW (ref 5–15)
BILIRUBIN TOTAL: 0.4 mg/dL (ref 0.3–1.2)
BUN: 16 mg/dL (ref 6–20)
CALCIUM: 10.2 mg/dL (ref 8.9–10.3)
CHLORIDE: 108 mmol/L (ref 101–111)
CO2: 27 mmol/L (ref 22–32)
Creatinine, Ser: 0.87 mg/dL (ref 0.61–1.24)
GFR calc Af Amer: >60 mL/min (ref >60)
GLUCOSE: 72 mg/dL (ref 65–99)
Potassium: 3.7 mmol/L (ref 3.5–5.1)
Sodium: 139 mmol/L (ref 135–145)
TOTAL PROTEIN: 6.9 g/dL (ref 6.5–8.1)

## 2017-11-07 LAB — CD4/CD8 (T-HELPER/T-SUPPRESSOR CELL)
CD4 ABSOLUTE: 20 /uL — AB (ref 500–1900)
CD4%: 1 % — AB (ref 30.0–60.0)
CD8 T Cell Abs: 170 /uL — ABNORMAL LOW (ref 230–1000)
CD8tox: 10 % — ABNORMAL LOW (ref 15.0–40.0)
RATIO: 0.11 — AB (ref 1.0–3.0)
Total lymphocyte count: 1670 /uL (ref 1000–4000)

## 2017-11-07 MED ORDER — FLUCONAZOLE 100 MG PO TABS
100.0000 mg | ORAL_TABLET | Freq: Every day | ORAL | 0 refills | Status: DC
Start: 2017-11-07 — End: 2019-06-17

## 2017-11-07 MED ORDER — ONDANSETRON HCL 4 MG PO TABS
4.0000 mg | ORAL_TABLET | Freq: Once | ORAL | Status: AC
  Administered 2017-11-07: 4 mg via ORAL
  Filled 2017-11-07: qty 1

## 2017-11-07 MED ORDER — SULFAMETHOXAZOLE-TRIMETHOPRIM 800-160 MG PO TABS
1.0000 | ORAL_TABLET | Freq: Once | ORAL | Status: AC
  Administered 2017-11-07: 1 via ORAL
  Filled 2017-11-07: qty 1

## 2017-11-07 MED ORDER — FLUCONAZOLE 100 MG PO TABS
100.0000 mg | ORAL_TABLET | Freq: Once | ORAL | Status: AC
  Administered 2017-11-07: 100 mg via ORAL
  Filled 2017-11-07: qty 1

## 2017-11-07 MED ORDER — SULFAMETHOXAZOLE-TRIMETHOPRIM 800-160 MG PO TABS: 1.0000 | ORAL_TABLET | Freq: Two times a day (BID) | ORAL | 0 refills | Status: AC

## 2017-11-07 NOTE — ED Triage Notes (Signed)
Pt c/o rash to groin area x 1 month. States he was given cream medication but it has not improved.

## 2017-11-07 NOTE — Discharge Instructions (Signed)
Your vital signs are essentially within normal limits.  Your oxygen level is 100% on room air.  You have for raw areas in the groin space.  Please cleanse these areas daily with soap and water.  Apply Neosporin.  Use Bactrim DS 2 times daily with food.  Please use Diflucan 1 tablet daily with food to assist with rash.  Please see Dr. Linus Salmons, or member of his team to manage your HIV.

## 2017-11-07 NOTE — ED Provider Notes (Signed)
[facility] EMERGENCY DEPARTMENT Provider Note   CSN: [id number] Arrival date & time: 11/07/17  3762     History   Chief Complaint Chief Complaint  Patient presents with  . Rash    HPI [name] is a 66 y.o. male.  Patient is a 66 year old male who presents to the emergency department with a complaint of a rash.  Patient has a history of HIV.  He states however he has not been evaluated for his HIV.  Neither has he had medication for over a year.  The patient presents because he has rash mostly of the groin, but he states that the rash is leaving his groin and now going into his lower extremities.  He has some itching but he states it does not bother him too much he also has some burning of this rash.  He noted a raw area on his groin and thought that he should come and have it checked out.  Patient states he has not been seen by the infectious disease clinic because he does not have transportation.  Patient denies any high fever.  He has not had any hemoptysis reported.  The patient denies vomiting, diarrhea.  He denies any excessive shortness of breath or cough.  He has not had any high fever to be reported.      Past Medical History:  Diagnosis Date  . HIV (human immunodeficiency virus infection) [facility])     Patient Active Problem List   Diagnosis Date Noted  . ERECTILE DYSFUNCTION 04/13/2008  . GLAUCOMA NOS 08/19/2007  . DENTAL CARIES 08/19/2007  . HIV DISEASE 10/22/2006  . 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  . PNEUMOCYSTIS PNEUMONIA 10/22/2006  . COPD 10/22/2006  . SEIZURE DISORDER 10/22/2006    History reviewed. No pertinent surgical history.     Home Medications    Prior to Admission medications   Medication Sig Start Date End Date Taking? Authorizing Provider  levETIRAcetam (KEPPRA) 500 MG tablet Take 1 tablet (500 mg total) by mouth 2 (two) times daily. 08/07/17   Mesner, Corene Cornea, MD  nystatin cream (MYCOSTATIN) Apply 1 application topically 2 (two) times  daily. 09/27/17   Evalee Jefferson, PA-C  nystatin-triamcinolone (MYCOLOG II) cream Apply to affected area daily until 3 days after rash clears 08/07/17   Mesner, Corene Cornea, MD  triamcinolone cream (KENALOG) 0.1 % Apply 1 application topically 2 (two) times daily. 09/27/17   Evalee Jefferson, PA-C    Family History No family history on file.  Social History Social History   Tobacco Use  . Smoking status: Current Every Day Smoker    Packs/day: 1.50    Types: Cigarettes  . Smokeless tobacco: Never Used  Substance Use Topics  . Alcohol use: No  . Drug use: No     Allergies   Penicillins   Review of Systems Review of Systems  Constitutional: Negative for activity change and fever.       All ROS Neg except as noted in HPI  HENT: Negative for congestion and nosebleeds.   Eyes: Negative for photophobia and discharge.  Respiratory: Negative for cough, shortness of breath and wheezing.   Cardiovascular: Negative for chest pain and palpitations.  Gastrointestinal: Negative for abdominal pain and blood in stool.  Genitourinary: Negative for discharge, dysuria, frequency, hematuria, penile pain, penile swelling, scrotal swelling and testicular pain.  Musculoskeletal: Negative for arthralgias, back pain and neck pain.  Skin: Positive for rash.  Neurological: Negative for dizziness, seizures and speech difficulty.  Psychiatric/Behavioral: Negative for confusion  and hallucinations.     Physical Exam Updated Vital Signs BP (!) 127/94   Pulse 100   Temp 97.8 F (36.6 C)   Resp 20   Ht 5' 11.5" (1.816 m)   Wt 63.5 kg (140 lb)   SpO2 100%   BMI 19.25 kg/m   Physical Exam  Constitutional: He is oriented to person, place, and time. He appears well-developed and well-nourished.  Non-toxic appearance.  HENT:  Head: Normocephalic.  Right Ear: Tympanic membrane and external ear normal.  Left Ear: Tympanic membrane and external ear normal.  Eyes: EOM and lids are normal. Pupils are equal, round,  and reactive to light.  Neck: Normal range of motion. Neck supple. Carotid bruit is not present.  Cardiovascular: Normal rate, regular rhythm, normal heart sounds, intact distal pulses and normal pulses. Exam reveals no friction rub.  No murmur heard. Pulmonary/Chest: Breath sounds normal. No respiratory distress.  Abdominal: Soft. Bowel sounds are normal. There is no tenderness. There is no guarding.  Genitourinary:  Genitourinary Comments: See skin exam.  Patient is uncircumcised.  There is no rash about the penis.  There is no drainage or discharge from the penis  Musculoskeletal: Normal range of motion.  Lymphadenopathy:       Head (right side): No submandibular adenopathy present.       Head (left side): No submandibular adenopathy present.    He has no cervical adenopathy.  Neurological: He is alert and oriented to person, place, and time. He has normal strength. No cranial nerve deficit or sensory deficit.  Skin: Skin is warm and dry.  There are a few palpable nodes in the inguinal area.  There are 4 denuded areas about the size of a nickel on the left side of the scrotum.  There is no testicular tenderness.  There is no increased redness of the scrotum.  No red streaking appreciated in the peritoneal area.  No drainage from the denuded areas.  There are multiple raised dark oval areas of the thighs and extending to the lower portion of the extremities bilaterally.  These areas are not tender and there are not hot.  There are no pustules noted.  There is no hives appreciated.  No petechiae noted.  Marland Kitchen  Psychiatric: He has a normal mood and affect. His speech is normal.  Nursing note and vitals reviewed.    ED Treatments / Results  Labs (all labs ordered are listed, but only abnormal results are displayed) Labs Reviewed  CBC WITH DIFFERENTIAL/PLATELET - Abnormal; Notable for the following components:      Result Value   RBC 3.52 (*)    Hemoglobin 10.2 (*)    HCT 32.2 (*)     Monocytes Absolute 1.1 (*)    All other components within normal limits  COMPREHENSIVE METABOLIC PANEL - Abnormal; Notable for the following components:   Albumin 2.3 (*)    ALT 16 (*)    Anion gap 4 (*)    All other components within normal limits  CD4/CD8 (T-HELPER/T-SUPPRESSOR CELL)  RPR    EKG  EKG Interpretation None       Radiology No results found.  Procedures Procedures (including critical care time)  Medications Ordered in ED Medications - No data to display   Initial Impression / Assessment and Plan / ED Course  I have reviewed the triage vital signs and the nursing notes.  Pertinent labs & imaging results that were available during my care of the patient were reviewed by me  and considered in my medical decision making (see chart for details).       Final Clinical Impressions(s) / ED Diagnoses MDM Vital signs are within normal limits.  Pulse oximetry is 100% on room air.  Complete blood count shows no acute changes.  There are noted a few bands present.  Conference of metabolic panel shows no acute changes.  CD4 and RPR have also been requested.  I reviewed the previous emergency department evaluations.  This rash is similar with the exception of the denuded areas in the groin area.  The patient has no significant swelling of the scrotum.  No increased redness.  No drainage from the denuded areas.  No testicular pain.  No evidence of acute event, no evidence of Fournier's gangrene or major infection.  Patient is an HIV patient who has not been treated over the past year.  His last CD4 count several months ago was 60.  We will cover him with Bactrim.  We will also try Diflucan for possible fungal related rash.  I have provided the patient with the name and phone number of the infectious disease clinic.  I strongly encouraged him to see them and to check with hospital social worker for any possible assistance with him getting back and forth to an appointment.    Final diagnoses:  Rash  HIV (human immunodeficiency virus infection) [facility])    ED Discharge Orders    None       Lily Kocher, PA-C 11/07/17 1152    Noemi Chapel, MD 11/08/17 1501

## 2017-11-08 LAB — RPR: RPR Ser Ql: NONREACTIVE

## 2017-11-10 ENCOUNTER — Encounter: Payer: Self-pay | Admitting: *Deleted

## 2017-11-10 ENCOUNTER — Telehealth: Payer: Self-pay | Admitting: *Deleted

## 2017-11-10 NOTE — Telephone Encounter (Signed)
Thanks Ambre! 

## 2017-11-10 NOTE — Telephone Encounter (Signed)
Referral received to engage with the patient. RN contacted [name] today and was able to speak with him. [name] stated getting transportation to [facility] has always been difficult for him. He stated he had been working with [facility] to try and find a closer infectious disease Dr but has not been able to locate one. To my understanding [place] patients have to have seek ID care in [place] or [place] which is about the same distance. [name] stated he really needs to get back on his medications and understands that he cannot get the medication until he can see the ID Dr. His last appt with Dr Baxter Flattery was in Dec 2016. I informed [name] that I would love to help and he gave me verbal consent to assist him. RN asked the patient if he would commit to keeping his appt if I could assist him with locating transportation? [name] confirmed again that he could not locate another provider and would keep his appt with Dr Baxter Flattery if I could get him some transportation  Contacted [facility] and she confirmed that patient has already been set up for transportation. We confirmed/update the MCD number, phone number and current address.  I contacted [name] back and he stated he would take the [facility] to [place] for his appt. We confirmed the address again. Offered a pharmacy visit for this Wednesday at 10:30. Patient accepted.  I will have to fax a letter to [facility] stating the patient must be seen on this date since it is less than 3 days away. Contacted [facility] and she confirmed that they cannot arrange transportation until I send them a letter stating the appt cannot be moved to a different date. Prepared letter and faxed it over to [facility]

## 2017-11-12 ENCOUNTER — Ambulatory Visit (INDEPENDENT_AMBULATORY_CARE_PROVIDER_SITE_OTHER): Payer: Medicare HMO | Admitting: Pharmacist

## 2017-11-12 ENCOUNTER — Telehealth: Payer: Self-pay | Admitting: *Deleted

## 2017-11-12 ENCOUNTER — Ambulatory Visit: Payer: Medicare HMO | Admitting: *Deleted

## 2017-11-12 DIAGNOSIS — B20 Human immunodeficiency virus [HIV] disease: Secondary | ICD-10-CM

## 2017-11-12 LAB — LIPID PANEL
Cholesterol: 102 mg/dL (ref <200)
HDL: 25 mg/dL — AB (ref >40)
LDL CHOLESTEROL (CALC): 60 mg/dL
NON-HDL CHOLESTEROL (CALC): 77 mg/dL (ref <130)
TRIGLYCERIDES: 88 mg/dL (ref <150)
Total CHOL/HDL Ratio: 4.1 (calc) (ref <5.0)

## 2017-11-12 MED ORDER — AZITHROMYCIN 600 MG PO TABS: 1200.0000 mg | ORAL_TABLET | ORAL | 3 refills | Status: DC

## 2017-11-12 MED ORDER — DARUN-COBIC-EMTRICIT-TENOFAF 800-150-200-10 MG PO TABS: 1.0000 | ORAL_TABLET | Freq: Every day | ORAL | 5 refills | Status: DC

## 2017-11-12 MED ORDER — SULFAMETHOXAZOLE-TRIMETHOPRIM 800-160 MG PO TABS: 1.0000 | ORAL_TABLET | Freq: Every day | ORAL | 5 refills | Status: DC

## 2017-11-12 MED FILL — AZITHROMYCIN 600 MG TABLET: 600 | 28 days supply | Qty: 8 | Fill #0

## 2017-11-12 MED FILL — SYMTUZA 800-150-200-10 MG T: 800-150-200 | 30 days supply | Qty: 30 | Fill #0

## 2017-11-12 NOTE — Progress Notes (Signed)
RN spoke with Mr Smolinsky over the phone and arranged a transporation and a pharmacy visit for the patient for today. During today's visit RN meet with Mr Genova face to face and furthered explained to him my role with the clinic. RN explained to Mr Dagostino that I would love to stay connected with him and help him when obstacles to care arise. I informed Mr Simkin that we would hate from him to go another 2 years without care when we can help keep him in care. Informed Mr Freilich that I will be calling him to check in with him and would love to hear from him if he has concerns or not feeling well. Mr Patriarca stated he does not need pill box fills and would prefer to take the medications out of the box. He did accept a pill box just in case he changes his mind. We also supplied food and clothing during today's vist. Consent signed at this time after explained the consent, rights and responsibilites and release of information.

## 2017-11-12 NOTE — Progress Notes (Signed)
HPI: [name] is a 66 y.o. male who presents to the pharmacy clinic after being out of care for his HIV since December 2016.   Allergies: Allergies  Allergen Reactions  . Penicillins Hives    Has patient had a PCN reaction causing immediate rash, facial/tongue/throat swelling, SOB or lightheadedness with hypotension: No Has patient had a PCN reaction causing severe rash involving mucus membranes or skin necrosis: No Has patient had a PCN reaction that required hospitalization: No Has patient had a PCN reaction occurring within the last 10 years: No If all of the above answers are "NO", then may proceed with Cephalosporin use.     Vitals:    Past Medical History: Past Medical History:  Diagnosis Date  . HIV (human immunodeficiency virus infection) ([place])     Social History: Social History   Socioeconomic History  . Marital status: Single    Spouse name: Not on file  . Number of children: Not on file  . Years of education: Not on file  . Highest education level: Not on file  Social Needs  . Financial resource strain: Not on file  . Food insecurity - worry: Not on file  . Food insecurity - inability: Not on file  . Transportation needs - medical: Not on file  . Transportation needs - non-medical: Not on file  Occupational History  . Not on file  Tobacco Use  . Smoking status: Current Every Day Smoker    Packs/day: 1.50    Types: Cigarettes  . Smokeless tobacco: Never Used  Substance and Sexual Activity  . Alcohol use: No  . Drug use: No  . Sexual activity: Yes    Partners: Female    Comment: patient given condoms  Other Topics Concern  . Not on file  Social History Narrative  . Not on file    Previous Regimen: Genvoya+ Prezista  Current Regimen: Symtuza   Labs: HIV 1 RNA Quant (copies/mL)  Date Value  12/05/2015 150,753 (H)  07/11/2014 <20  04/02/2013 3,566 (H)   CD4 T Cell Abs (/uL)  Date Value  08/07/2017 60 (L)  12/05/2015 250 (L)   07/11/2014 490   Hep B S Ab (no units)  Date Value  02/13/2012 NEG   Hepatitis B Surface Ag (no units)  Date Value  02/13/2012 NEGATIVE   HCV Ab (no units)  Date Value  02/13/2012 NEGATIVE    CrCl: Estimated Creatinine Clearance: 74 mL/min (by C-G formula based on SCr of 0.87 mg/dL).  Lipids:    Component Value Date/Time   CHOL 113 07/11/2014 1140   TRIG 59 07/11/2014 1140   HDL 33 (L) 07/11/2014 1140   CHOLHDL 3.4 07/11/2014 1140   VLDL 12 07/11/2014 1140   LDLCALC 68 07/11/2014 1140    Assessment: [name] presents to the clinic today to re-establish care for his HIV. He was last seen by Dr. Baxter Flattery in December of 2016. He has been off of his medications for more than a year. We were waiting for him to come back in to see Dr. Baxter Flattery before giving him refills. He lives in [place] and has issues with transportation, which is the reason he fell out of care. We set him up with Ambre and she will make sure that he has transportation for all of his future appointments.   He has reported to the ED several times in the past year due to issues with a rash near his groin. Most recently, he was prescribed Bactrim DS BID  and Fluconazole for the rash. He states that the rash has improved with these medications and he has not had any other issues. He was negative for fevers, chills, night sweats, abdominal pain, swollen lymph nodes, and thrush.   Given his low CD4 count, we will continue the Bactrim at once daily dosing. We will also start azithromycin once a week. Given his history and long period off of medications, we will start him on Symtuza. [name] has [employer] which will cover all of these medications. He only gets about $770.00 a month for living expenses so we also applied for PAF to cover the copays. We will fill the medications at [facility] and mail them to his mother's house in [place]. His house is only about a 10-15 minute walk from his mother's house and this system  will work well for him since he is out and about a lot during the day.   We also provided [name] with a bag of microwaveable food as he does not have a stove at his house and was running low.   We will collect a viral load, lipid profile and urine cytology on him today. All other labs including RPR and CD4 were done recently at the ED.  Recommendations: - Viral load w/genotype, lipid profile, urine cytology today - Start taking Symtuza and Bactrim daily - Start taking Azithromycin once a week - F/U with pharmacy on 12/11 at 10:30 [place], PharmD PGY2 [place] 11/12/2017, 10:54 AM

## 2017-11-12 NOTE — Patient Instructions (Signed)
RN met with client today. RN reviewed Transport planner, [place], Home Safety Management Information Booklet. Home Fire Safety Assessment, Fall Risk Assessment and Suicide Risk Assessment was performed. RN also discussed information on a Living Will, Advanced Directives, and [place]. RN and Client/Designated Party educated/reviewed/signed Client Agreement and Consent for Service form along with Patient Rights and Responsibilities statement. RN developed patient specific and centered care plan. RN provided contact information and reviewed how to receive emergency help after hours for schedule changes, billing  questions, reporting of safety issues, falls, concerns or any needs/questions. Standard Precaution and Infection control along with interventions to correct or prevent high risk behaviors instructed to the patient. Client/Caregiver reports understanding and agreement with the above

## 2017-11-18 ENCOUNTER — Telehealth: Payer: Self-pay | Admitting: *Deleted

## 2017-11-18 MED FILL — SULFAMETHOXAZOLE-TMP DS TAB: 800-160 | 30 days supply | Qty: 30 | Fill #0

## 2017-11-18 NOTE — Telephone Encounter (Addendum)
RN received a call from Mr Mizrahi stating he received 2 of his medications but have not received a third medications that he needed to restart as well.   Asked Mr Haberland if I could call him back after following up for him.   Patient should be on Zithromax once a week, Symtuza, and Bactrim. [place] and they sent the Zithromax and Symtuza and will send out the Bactrim. [facility] trid to contacted Mr Schupp to confirm the address so the Bactrim can be mailed but was unable to reach him. I tried to call him back and have been unable to reach him.  Mr Taborda called me back and stated his phone has been missing up. He confirmed that he has the Zithromax and Symtuza. I advised him that we can get him the Bactrim. Mr. Manas requested that we mail the medications to his mom's address which is the same address that we sent the other medications to.   Spoke with Elizabeth/[facility] and confirmed the address for the patient's Bactrim to be mailed out

## 2017-11-24 LAB — HIV RNA, RTPCR W/R GT (RTI, PI,INT)
HIV 1 RNA QUANT: 400000 {copies}/mL — AB
HIV-1 RNA QUANT, LOG: 5.6 {Log_copies}/mL — AB

## 2017-11-24 LAB — HIV-1 GENOTYPE: HIV-1 GENOTYPE: DETECTED — AB

## 2017-11-24 LAB — HIV-1 INTEGRASE GENOTYPE

## 2017-12-05 ENCOUNTER — Telehealth: Payer: Self-pay | Admitting: *Deleted

## 2017-12-05 NOTE — Telephone Encounter (Signed)
RN contacted [name] and was attempting to inform him that his appt for 12/11 needs to be rescheduled for another date. [name] was not able to hear me after several attempts. I informed him that I will text him the information.  Contacted [facility] [phone] and requested that the cancel the transportation request for [name] scheduled for 12/11 in [place].

## 2017-12-09 ENCOUNTER — Ambulatory Visit: Payer: Medicare HMO

## 2017-12-09 MED FILL — SYMTUZA 800-150-200-10 MG T: 800-150-200 | 30 days supply | Qty: 30 | Fill #1

## 2017-12-10 ENCOUNTER — Telehealth: Payer: Self-pay | Admitting: *Deleted

## 2017-12-10 NOTE — Telephone Encounter (Signed)
COntacted [facility] to arrange transportation for [name].  I was unable to reach anyone at this time. I will try again throughout the day

## 2017-12-10 NOTE — Telephone Encounter (Signed)
Contacted Transportation again but was unable to speak with anyone. Message left stating the reason for my call is to arrange transportation for [name] for his appt with [facility] on the 18th at 11:30

## 2017-12-15 ENCOUNTER — Telehealth: Payer: Self-pay | Admitting: *Deleted

## 2017-12-15 NOTE — Telephone Encounter (Signed)
Contacted [name] and reminded him that he has an appt tomorrow at 11:30. Timothy Garlow stated he has not heard anything from transportation stating what time they will be picking him up.  Contacted [facility] but was unable to get through to confirm transportation. I will continue to call.

## 2017-12-16 ENCOUNTER — Ambulatory Visit: Payer: Medicare HMO

## 2017-12-16 ENCOUNTER — Ambulatory Visit (INDEPENDENT_AMBULATORY_CARE_PROVIDER_SITE_OTHER): Payer: Medicare HMO | Admitting: Pharmacist

## 2017-12-16 DIAGNOSIS — B20 Human immunodeficiency virus [HIV] disease: Secondary | ICD-10-CM

## 2017-12-16 LAB — CBC WITH DIFFERENTIAL/PLATELET
Basophils Absolute: 32 cells/uL (ref 0–200)
Basophils Relative: 0.6 %
EOS PCT: 11.3 %
Eosinophils Absolute: 599 cells/uL — ABNORMAL HIGH (ref 15–500)
HEMATOCRIT: 37.9 % — AB (ref 38.5–50.0)
HEMOGLOBIN: 13 g/dL — AB (ref 13.2–17.1)
LYMPHS ABS: 2486 {cells}/uL (ref 850–3900)
MCH: 31.6 pg (ref 27.0–33.0)
MCHC: 34.3 g/dL (ref 32.0–36.0)
MCV: 92 fL (ref 80.0–100.0)
MPV: 10 fL (ref 7.5–12.5)
Monocytes Relative: 10.8 %
NEUTROS ABS: 1611 {cells}/uL (ref 1500–7800)
NEUTROS PCT: 30.4 %
Platelets: 160 10*3/uL (ref 140–400)
RBC: 4.12 10*6/uL — AB (ref 4.20–5.80)
RDW: 12.6 % (ref 11.0–15.0)
Total Lymphocyte: 46.9 %
WBC: 5.3 10*3/uL (ref 3.8–10.8)
WBCMIX: 572 {cells}/uL (ref 200–950)

## 2017-12-16 LAB — COMPREHENSIVE METABOLIC PANEL
AG Ratio: 0.8 (calc) — ABNORMAL LOW (ref 1.0–2.5)
ALBUMIN MSPROF: 3.6 g/dL (ref 3.6–5.1)
ALKALINE PHOSPHATASE (APISO): 83 U/L (ref 40–115)
ALT: 14 U/L (ref 9–46)
AST: 20 U/L (ref 10–35)
BUN: 14 mg/dL (ref 7–25)
CO2: 26 mmol/L (ref 20–32)
CREATININE: 1.21 mg/dL (ref 0.70–1.25)
Calcium: 12.4 mg/dL — ABNORMAL HIGH (ref 8.6–10.3)
Chloride: 109 mmol/L (ref 98–110)
Globulin: 4.5 g/dL (calc) — ABNORMAL HIGH (ref 1.9–3.7)
Glucose, Bld: 91 mg/dL (ref 65–99)
POTASSIUM: 4.3 mmol/L (ref 3.5–5.3)
SODIUM: 140 mmol/L (ref 135–146)
TOTAL PROTEIN: 8.1 g/dL (ref 6.1–8.1)
Total Bilirubin: 0.3 mg/dL (ref 0.2–1.2)

## 2017-12-16 MED FILL — AZITHROMYCIN 600 MG TABLET: 600 | 28 days supply | Qty: 8 | Fill #1

## 2017-12-16 MED FILL — SULFAMETHOXAZOLE-TMP DS TAB: 800-160 | 30 days supply | Qty: 30 | Fill #1

## 2017-12-16 NOTE — Progress Notes (Signed)
[facility] for Infectious Disease Pharmacy Visit  HPI: [name] is a 66 y.o. male who presents to the [facility] clinic for HIV follow-up.  Patient Active Problem List   Diagnosis Date Noted  . ERECTILE DYSFUNCTION 04/13/2008  . GLAUCOMA NOS 08/19/2007  . DENTAL CARIES 08/19/2007  . HIV DISEASE 10/22/2006  . 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  . PNEUMOCYSTIS PNEUMONIA 10/22/2006  . COPD 10/22/2006  . SEIZURE DISORDER 10/22/2006      Medication List        Accurate as of 12/16/17  2:01 PM. Always use your most recent med list.          azithromycin 600 MG tablet Commonly known as:  ZITHROMAX Take 2 tablets (1,200 mg total) once a week by mouth.   Darunavir-Cobicisctat-Emtricitabine-Tenofovir Alafenamide 800-150-200-10 MG Tabs Commonly known as:  SYMTUZA Take 1 tablet daily with breakfast by mouth.   fluconazole 100 MG tablet Commonly known as:  DIFLUCAN Take 1 tablet (100 mg total) daily by mouth.   sulfamethoxazole-trimethoprim 800-160 MG tablet Commonly known as:  BACTRIM DS Take 1 tablet daily by mouth.       Allergies: Allergies  Allergen Reactions  . Penicillins Hives    Has patient had a PCN reaction causing immediate rash, facial/tongue/throat swelling, SOB or lightheadedness with hypotension: No Has patient had a PCN reaction causing severe rash involving mucus membranes or skin necrosis: No Has patient had a PCN reaction that required hospitalization: No Has patient had a PCN reaction occurring within the last 10 years: No If all of the above answers are "NO", then may proceed with Cephalosporin use.     Past Medical History: Past Medical History:  Diagnosis Date  . HIV (human immunodeficiency virus infection) ([place])     Social History: Social History   Socioeconomic History  . Marital status: Single    Spouse name: Not on file  . Number of children: Not on file  . Years of education: Not on file  . Highest education level: Not on  file  Social Needs  . Financial resource strain: Not on file  . Food insecurity - worry: Not on file  . Food insecurity - inability: Not on file  . Transportation needs - medical: Not on file  . Transportation needs - non-medical: Not on file  Occupational History  . Not on file  Tobacco Use  . Smoking status: Current Every Day Smoker    Packs/day: 1.50    Types: Cigarettes  . Smokeless tobacco: Never Used  Substance and Sexual Activity  . Alcohol use: No  . Drug use: No  . Sexual activity: Yes    Partners: Female    Comment: patient given condoms  Other Topics Concern  . Not on file  Social History Narrative  . Not on file    Labs: HIV 1 RNA Quant (copies/mL)  Date Value  11/12/2017 400,000 (H)  12/05/2015 150,753 (H)  07/11/2014 <20   CD4 T Cell Abs (/uL)  Date Value  08/07/2017 60 (L)  12/05/2015 250 (L)  07/11/2014 490   Hep B S Ab (no units)  Date Value  02/13/2012 NEG   Hepatitis B Surface Ag (no units)  Date Value  02/13/2012 NEGATIVE   HCV Ab (no units)  Date Value  02/13/2012 NEGATIVE    Lipids:    Component Value Date/Time   CHOL 102 11/12/2017 1123   TRIG 88 11/12/2017 1123   HDL 25 (L) 11/12/2017 1123   CHOLHDL 4.1 11/12/2017  1123   VLDL 12 07/11/2014 1140   LDLCALC 68 07/11/2014 1140    Current HIV Regimen: Symtuza  Assessment: [name] is here today to follow-up for his HIV infection. I recently saw him ~1 month ago.  Prior to that, he had not been seen since December 2016.  When he came last month, his CD4 was 20 and his HIV viral load was 400,000.  I started him on Symtuza through [facility]. He is also working closely with [name]. He continues to take his Symtuza daily with breakfast. He states he has missed no doses of that medication.  He is also taking Bactrim once daily for PCP prophylaxis and azithromycin once weekly for MAC prophylaxis.  He ran out of Bactrim and azithromycin, so the pharmacy will mail them out today.  He is not having  any side effects attributed to [place]. No diarrhea, nausea, or vomiting. He is telling me that he has become dizzy lately and has fallen ~3 times in the last month or so.  He said he hasn't passed out and the last time it happened he got really sweaty before becoming dizzy.  I took his BP today and it was 143/86, HR 86.  I told him his BP was a tad high and we would recheck it when he comes back next month and possibly start something for it if needed. He did tell me that he is drinking 5-6 full size energy drinks per day.  When asked why he states he drinks that instead of alcohol now.  I told him that most likely contains caffeine and could be contributing to his dizziness. I told him to try and decrease the amount he drinks of those daily.  He is also itching which started about a month before restarting the medications.  No rash except for the one in his groin that has still not gone away.  No headaches, stiff neck, night sweats, fevers, shortness of breath, or chest pain. He states he has blurry vision sometime but says he needs glasses because he lost his and hasn't been seen by the eye doctor in "forever".  He is a smoker and smokes 1 PPD.  I told him it's never too late to quit and to let me know if he wants help with that.  I did tell him how dangerously low his CD4 count is.  I told him that there is a good chance he is feeling this way and not gaining weight like he wants to because his immune system is so low. I will get a CD4 and viral load today including a BMET to check his blood sugar.  The sweating and dizziness sounds like possible hypoglycemia so I will check his sugar. I told him that if it continues to happen, he needs to go to the ED or urgent care.  I will make an appointment with Dr. Baxter Spears for end of January (first available).  Plan: - Continue Symtuza PO once daily with food - Continue Bactrim 1 DS PO daily  - Continue azithromycin 1200 mg PO once weekly - HIV RNA, CD4, CMET, CBC  with diff today - F/u with Dr. Baxter Spears 01/27/18 at [place]. [place], PharmD, [place], [place] for Infectious Disease 12/16/2017, 2:01 PM

## 2017-12-17 LAB — T-HELPER CELL (CD4) - (RCID CLINIC ONLY)
CD4 % Helper T Cell: 8 % — ABNORMAL LOW (ref 33–55)
CD4 T Cell Abs: 210 /uL — ABNORMAL LOW (ref 400–2700)

## 2017-12-18 LAB — HIV-1 RNA QUANT-NO REFLEX-BLD
HIV 1 RNA QUANT: 764 {copies}/mL — AB
HIV-1 RNA QUANT, LOG: 2.88 {Log_copies}/mL — AB

## 2017-12-24 ENCOUNTER — Ambulatory Visit: Payer: Medicare HMO

## 2018-01-01 MED FILL — SYMTUZA 800-150-200-10 MG T: 800-150-200 | 30 days supply | Qty: 30 | Fill #2

## 2018-01-01 MED FILL — AZITHROMYCIN 600 MG TABLET: 600 | 28 days supply | Qty: 8 | Fill #2

## 2018-01-01 MED FILL — SULFAMETHOXAZOLE-TMP DS TAB: 800-160 | 30 days supply | Qty: 30 | Fill #2

## 2018-01-03 NOTE — Telephone Encounter (Signed)
Order received on 11/10/17 by J. Cockerham/Dr Baxter Flattery  to evaluate patient for [facility]).  Contacted the patient on 11/10/17 with 1st visit on 11/12/17  for [facility]. Patient was consented to care at that time.    Initial Assessment Points to Consider for Care  Points are not all inclusive to services and educated provided but supports the  patient's Individualized Plan of Care.  . Is Home safe for visits?Patient declines home visits . / Are all firearms or weapons secure? Marland Kitchen Insurance Coverage: MCR/MCD . 1st HIV Diagnosis: 2005 . Mode of HIV Transmission: not known . Functional Status: none noted . Current Housing/Needs:living in a boarding home  . Social Support/System: not noted . Culture/Religion/Spirituality:will not interfere with plan of care . Educational Background:high school . Legal Issues:none noted . Access/Utilization of [employer]: limited, uses [facility] . Mental Health Concerns/Diagnosis:n/a . Alcohol and/or Drug Use: n/a . Risk and Knowledge of HIV and Reduction in Transmission: Education provided . Nutritional Needs: food given   Effective 11/12/17: 9mo2, 78mo2   3 PRN's for complications with disease process/progression, medication changes or concerns   [facility] will assess for learning needs related to diagnosis and treatment regimen, provide education as needed, fill pill box if needed, address any barriers which may be preventing medication compliance, and communicating with care team including physician and case manager.   Individualized Plan Of Care with Certification Period from 11/12/17 to 02/10/18  a. Type of service(s) and care to be delivered: RN Case Management  b. Frequency and duration of service: Effective 11/12/17; 66mo2, 52mo1 3 prns for  complications  with disease process/progression, medication changes or  concerns . Visits/Contact may be conducted telephonically or in person to  best suit the patient.  c.  Activity restrictions: Pt may be up as tolerated and can safely ambulate  without the need for a assistive device   d. Safety Measures: Standard Precautions/Infection Control   e. Service Objectives and Goals: Service Objectives are to assist the pt with  HIV medication regimen adherence and staying in care with the Infectious  Disease Clinic by identifying barriers to care. RN will address the barriers  that  are identified by the patient with transportation being the biggest barrier per patient.  f. Equipment required: No additional equipment needs at this time   g. Functional Limitations: None Noted  h. Rehabilitation potential: Guarded   i. Diet and Nutritional Needs: Regular Diet   j. Medications and treatments: Medications have been reconciled and  reviewed and are a part of EPIC electronic file   k. Specific therapies if needed: RN   l. Pertinent diagnoses: HIV disease,  Hx of medication NonCompliance   m. Expected outcome: Guarded

## 2018-01-08 ENCOUNTER — Telehealth: Payer: Self-pay | Admitting: *Deleted

## 2018-01-08 NOTE — Telephone Encounter (Signed)
RN contacted the patient as an attempt to stay connected/engaged. RN left a message stating that I wanted to be sure all is well and that [name] still has his medications on hand. Also included in the message that I would like to be sure [name] is prepared for the possible inclement weather to come.  While completing this documentation I received a call back from the patient. [name] states [name] has all his medications and has  taken them each day without any problems or concerns.  [name] states [name] is prepared for the possible weather to come and thanked me for my call.

## 2018-01-27 ENCOUNTER — Telehealth: Payer: Self-pay | Admitting: *Deleted

## 2018-01-27 ENCOUNTER — Encounter: Payer: Self-pay | Admitting: *Deleted

## 2018-01-27 ENCOUNTER — Ambulatory Visit: Payer: Medicare HMO | Admitting: Internal Medicine

## 2018-01-27 MED FILL — SYMTUZA 800-150-200-10 MG T: 800-150-200 | 30 days supply | Qty: 30 | Fill #3

## 2018-01-27 MED FILL — SULFAMETHOXAZOLE-TMP DS TAB: 800-160 | 30 days supply | Qty: 30 | Fill #3

## 2018-01-27 MED FILL — AZITHROMYCIN 600 MG TABLET: 600 | 28 days supply | Qty: 8 | Fill #3

## 2018-01-27 NOTE — Telephone Encounter (Addendum)
Received a call from Mr Yellowhair stated he missed his transportation and had to have his appt rescheduled for Mar 6th. He would like to come in before March and wondered if he could get a sooner appt. I advised Mr Zabriskie that I would have to speak with the clinic.   Order received on initially on 11/10/17 by J. Cockerham/Dr Baxter Flattery  to evaluate patient for [facility]).  Contacted the patient on 11/10/17 with 1st visit on 11/12/17  for [facility]. Patient was consented to care at that time.   New orders received today from Dr Baxter Flattery to continue with assisting Mr Spinella. He declines home visit but engages with me over the phone.   Initial Assessment Points to Consider for Care  Points are not all inclusive to services and educated provided but supports the  patient's Individualized Plan of Care.  . Is Home safe for visits?Patient declines home visits . / Are all firearms or weapons secure? Marland Kitchen Insurance Coverage: MCR/MCD . 1st HIV Diagnosis: 2005 . Mode of HIV Transmission: not known . Functional Status: none noted . Current Housing/Needs:living in a boarding home  . Social Support/System: not noted . Culture/Religion/Spirituality:will not interfere with plan of care . Educational Background:high school . Legal Issues:none noted . Access/Utilization of [employer]: limited, uses [facility] . Mental Health Concerns/Diagnosis:n/a . Alcohol and/or Drug Use: n/a . Risk and Knowledge of HIV and Reduction in Transmission: Education provided . Nutritional Needs: food given   Effective 02/11/18 : 38mo2, 79mo2   3 PRN's for complications with disease process/progression, medication changes or concerns   [facility] will assess for learning needs related to diagnosis and treatment regimen, provide education as needed, fill pill box if needed, address any barriers which may be preventing medication compliance, and communicating with care team including physician  and case manager.   Individualized Plan Of Care with Certification Period from 02/11/18 to 05/12/18  a. Type of service(s) and care to be delivered: RN Case Management  b. Frequency and duration of service: Effective 02/11/18; 74mo2, 85mo1 3 prns for  complications  with disease process/progression, medication changes or  concerns . Visits/Contact may be conducted telephonically or in person to  best suit the patient.  c. Activity restrictions: Pt may be up as tolerated and can safely ambulate  without the need for a assistive device   d. Safety Measures: Standard Precautions/Infection Control   e. Service Objectives and Goals: Service Objectives are to assist the pt with  HIV medication regimen adherence and staying in care with the Infectious  Disease Clinic by identifying barriers to care. RN will address the barriers  that  are identified by the patient with transportation being the biggest barrier per patient.  f. Equipment required: No additional equipment needs at this time   g. Functional Limitations: None Noted  h. Rehabilitation potential: Guarded   i. Diet and Nutritional Needs: Regular Diet   j. Medications and treatments: Medications have been reconciled and  reviewed and are a part of EPIC electronic file   k. Specific therapies if needed: RN   l. Pertinent diagnoses: HIV disease,  Hx of medication NonCompliance   m. Expected outcome: Guarded

## 2018-01-27 NOTE — Telephone Encounter (Signed)
Spoke with Dr Baxter Flattery and she agreed to see Mr Kempker on Friday. I will fax and call [facility] making them aware of the appt needed.

## 2018-01-30 ENCOUNTER — Encounter: Payer: Self-pay | Admitting: Internal Medicine

## 2018-01-30 ENCOUNTER — Ambulatory Visit (INDEPENDENT_AMBULATORY_CARE_PROVIDER_SITE_OTHER): Payer: Medicare HMO | Admitting: Internal Medicine

## 2018-01-30 VITALS — BP 129/86 | HR 87 | Temp 97.9°F | Ht 71.0 in | Wt 134.0 lb

## 2018-01-30 DIAGNOSIS — Z23 Encounter for immunization: Secondary | ICD-10-CM

## 2018-01-30 DIAGNOSIS — E43 Unspecified severe protein-calorie malnutrition: Secondary | ICD-10-CM | POA: Diagnosis not present

## 2018-01-30 DIAGNOSIS — H538 Other visual disturbances: Secondary | ICD-10-CM | POA: Diagnosis not present

## 2018-01-30 DIAGNOSIS — Z7185 Encounter for immunization safety counseling: Secondary | ICD-10-CM

## 2018-01-30 DIAGNOSIS — B20 Human immunodeficiency virus [HIV] disease: Secondary | ICD-10-CM

## 2018-01-30 DIAGNOSIS — Z7189 Other specified counseling: Secondary | ICD-10-CM

## 2018-01-30 LAB — BASIC METABOLIC PANEL
BUN: 17 mg/dL (ref 7–25)
CHLORIDE: 106 mmol/L (ref 98–110)
CO2: 26 mmol/L (ref 20–32)
Calcium: 12.7 mg/dL — ABNORMAL HIGH (ref 8.6–10.3)
Creat: 1.24 mg/dL (ref 0.70–1.25)
Glucose, Bld: 77 mg/dL (ref 65–99)
POTASSIUM: 4.8 mmol/L (ref 3.5–5.3)
SODIUM: 136 mmol/L (ref 135–146)

## 2018-01-30 LAB — T-HELPER CELL (CD4) - (RCID CLINIC ONLY)
CD4 % Helper T Cell: 6 % — ABNORMAL LOW (ref 33–55)
CD4 T Cell Abs: 150 /uL — ABNORMAL LOW (ref 400–2700)

## 2018-01-30 NOTE — Progress Notes (Signed)
[name], male   DOB: 1951-05-27, 67 y.o.   MRN: [id number]  HPI 67yo M with advanced hiv disease, CD 4 count of 210/VL764 currently on symtuza as well as bactrim and azithro prophylaxis. He has been back on his HIV meds for the past 2-3 months. He states he continues to take his meds. He is noticing some blurry vision to his right eye that is transient that is occasionally associated with headache. Denies blurry vision currently  Outpatient Encounter Medications as of 01/30/2018  Medication Sig  . azithromycin (ZITHROMAX) 600 MG tablet Take 2 tablets (1,200 mg total) once a week by mouth.  . Darunavir-Cobicisctat-Emtricitabine-Tenofovir Alafenamide (SYMTUZA) 800-150-200-10 MG TABS Take 1 tablet daily with breakfast by mouth.  . sulfamethoxazole-trimethoprim (BACTRIM DS) 800-160 MG tablet Take 1 tablet daily by mouth.  . fluconazole (DIFLUCAN) 100 MG tablet Take 1 tablet (100 mg total) daily by mouth. (Patient not taking: Reported on 01/30/2018)   No facility-administered encounter medications on file as of 01/30/2018.      Patient Active Problem List   Diagnosis Date Noted  . ERECTILE DYSFUNCTION 04/13/2008  . GLAUCOMA NOS 08/19/2007  . DENTAL CARIES 08/19/2007  . HIV DISEASE 10/22/2006  . 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  . PNEUMOCYSTIS PNEUMONIA 10/22/2006  . COPD 10/22/2006  . SEIZURE DISORDER 10/22/2006     Health Maintenance Due  Topic Date Due  . TETANUS/TDAP  05/18/1969  . COLONOSCOPY  05/18/2000  . PNA vac Low Risk Adult (1 of 2 - PCV13) 05/19/2015  . INFLUENZA VACCINE  07/30/2017     Review of Systems Per hpi otherwise, no fever, chills,nightsweats, neck pain, diarrhea. Still has not gained appreciable amount of weight Physical Exam   BP 129/86   Pulse 87   Temp 97.9 F (36.6 C) (Oral)   Ht 5\' 11"  (1.803 m)   Wt 134 lb (60.8 kg)   BMI 18.69 kg/m   Physical Exam  Constitutional: He is oriented to person, place, and time. He appears cachetic  and chronically ill. No distress.  HENT:  Mouth/Throat: Oropharynx is clear and moist. No oropharyngeal exudate.  Cardiovascular: Normal rate, regular rhythm and normal heart sounds. Exam reveals no gallop and no friction rub.  No murmur heard.  Pulmonary/Chest: Effort normal and breath sounds normal. No respiratory distress. He has no wheezes.  Abdominal: Soft. Bowel sounds are normal. He exhibits no distension. There is no tenderness.  Lymphadenopathy:  He has no cervical adenopathy.  Neurological: He is alert and oriented to person, place, and time.  Skin: Skin is warm and dry. No rash noted. No erythema.  Psychiatric: He has a normal mood and affect. His behavior is normal.    Lab Results  Component Value Date   CD4TCELL 8 (L) 12/16/2017   Lab Results  Component Value Date   CD4TABS 210 (L) 12/16/2017   CD4TABS 60 (L) 08/07/2017   CD4TABS 250 (L) 12/05/2015   Lab Results  Component Value Date   HIV1RNAQUANT 764 (H) 12/16/2017   Lab Results  Component Value Date   HEPBSAB NEG 02/13/2012   Lab Results  Component Value Date   LABRPR Non Reactive 11/07/2017    CBC Lab Results  Component Value Date   WBC 5.3 12/16/2017   RBC 4.12 (L) 12/16/2017   HGB 13.0 (L) 12/16/2017   HCT 37.9 (L) 12/16/2017   PLT 160 12/16/2017   MCV 92.0 12/16/2017   MCH 31.6 12/16/2017   MCHC 34.3 12/16/2017   RDW  12.6 12/16/2017   LYMPHSABS 2,486 12/16/2017   MONOABS 1.1 (H) 11/07/2017   EOSABS 599 (H) 12/16/2017    BMET Lab Results  Component Value Date   NA 140 12/16/2017   K 4.3 12/16/2017   CL 109 12/16/2017   CO2 26 12/16/2017   GLUCOSE 91 12/16/2017   BUN 14 12/16/2017   CREATININE 1.21 12/16/2017   CALCIUM 12.4 (H) 12/16/2017   GFRNONAA >60 11/07/2017   GFRAA >60 11/07/2017      Assessment and Plan hiv disease = will check cd 4 count and viral load today as well as kidney function  Blurry vision = will refer to ophtho for evaluation. I was unable to get clear  view of disc. Concern for opportunistic infection with immune reconstitution  Protein-caloric malnutrition = will give ensure to help with supplementation  Health maintenance = -will give pneumococcal and flu vaccine today - will check labs

## 2018-02-02 LAB — HIV-1 RNA QUANT-NO REFLEX-BLD
HIV 1 RNA QUANT: 86 {copies}/mL — AB
HIV-1 RNA Quant, Log: 1.93 Log copies/mL — ABNORMAL HIGH

## 2018-03-04 ENCOUNTER — Ambulatory Visit: Payer: Medicaid Other | Admitting: Internal Medicine

## 2018-03-10 ENCOUNTER — Telehealth: Payer: Self-pay | Admitting: *Deleted

## 2018-03-10 DIAGNOSIS — B20 Human immunodeficiency virus [HIV] disease: Secondary | ICD-10-CM

## 2018-03-10 MED ORDER — DARUN-COBIC-EMTRICIT-TENOFAF 800-150-200-10 MG PO TABS: 1.0000 | ORAL_TABLET | Freq: Every day | ORAL | 5 refills | Status: DC

## 2018-03-10 MED ORDER — SULFAMETHOXAZOLE-TRIMETHOPRIM 800-160 MG PO TABS: 1.0000 | ORAL_TABLET | ORAL | 5 refills | Status: DC

## 2018-03-10 MED FILL — SULFAMETHOXAZOLE-TMP DS TAB: 800-160 | 30 days supply | Qty: 30 | Fill #4

## 2018-03-10 MED FILL — AZITHROMYCIN 600 MG TABLET: 600 | 28 days supply | Qty: 8 | Fill #4

## 2018-03-10 MED FILL — SYMTUZA 800-150-200-10 MG T: 800-150-200 | 30 days supply | Qty: 30 | Fill #4

## 2018-03-10 NOTE — Telephone Encounter (Signed)
Patient called requesting refills.  He uses [facility].  Patient's last CD4 was 150, creatinine was 1.24.  Per Dr Baxter Flattery, ok to stop azithromycin for MAC prophylaxis.  Ok to decrease bactrim to 3 times weekly for PCP prophylaxis. OK to refill Symtuza.

## 2018-03-13 ENCOUNTER — Other Ambulatory Visit: Payer: Self-pay | Admitting: *Deleted

## 2018-03-13 DIAGNOSIS — B20 Human immunodeficiency virus [HIV] disease: Secondary | ICD-10-CM

## 2018-03-13 NOTE — Progress Notes (Signed)
.  h 

## 2018-03-17 ENCOUNTER — Other Ambulatory Visit: Payer: Medicaid Other

## 2018-04-02 MED FILL — SULFAMETHOXAZOLE-TMP DS TAB: 800-160 | 30 days supply | Qty: 30 | Fill #5

## 2018-04-02 MED FILL — SYMTUZA 800-150-200-10 MG T: 800-150-200 | 30 days supply | Qty: 30 | Fill #5

## 2018-04-09 ENCOUNTER — Encounter: Payer: Medicaid Other | Admitting: Internal Medicine

## 2018-04-14 ENCOUNTER — Other Ambulatory Visit: Payer: Self-pay | Admitting: Internal Medicine

## 2018-04-14 DIAGNOSIS — B20 Human immunodeficiency virus [HIV] disease: Secondary | ICD-10-CM

## 2018-04-14 MED FILL — AZITHROMYCIN 600 MG TABLET: 600 | 28 days supply | Qty: 8 | Fill #0

## 2018-04-22 ENCOUNTER — Telehealth: Payer: Self-pay | Admitting: *Deleted

## 2018-04-22 NOTE — Telephone Encounter (Signed)
RN contacted the patient as an attempt to stay connected/engaged. I would like to be sure [name] received his medications for this month.  RN left a message stating that I wanted to be sure all is well and to please let me know if I can assist in any way.

## 2018-05-06 MED FILL — SULFAMETHOXAZOLE-TMP DS TAB: 800-160 | 28 days supply | Qty: 12 | Fill #0

## 2018-05-06 MED FILL — SYMTUZA 800-150-200-10 MG T: 800-150-200 | 30 days supply | Qty: 30 | Fill #0

## 2018-05-06 MED FILL — AZITHROMYCIN 600 MG TABLET: 600 | 28 days supply | Qty: 8 | Fill #1

## 2018-05-07 ENCOUNTER — Telehealth: Payer: Self-pay | Admitting: *Deleted

## 2018-05-07 NOTE — Telephone Encounter (Signed)
Contacted [name] with a focus on staying engaged with him. [name] stated he has missed a couple of appointments because he could not get his transportation straight. I reminded him of his upcoming appt on June 4th (10:30) he thought his appt was on the 13th or 14th of June. I asked him if I could call transportation on his behave to ensure he has transportation to his next appt with [name]. [name] stated I could and thanked me. He also stated that he continues to get his medications on schedule and the meds are to be delivered tomorrow. He states he takes the medications daily without any concerns.   Contacted [facility] and [place] [name] to ensure he has transportation for his upcoming appt with [name] on June 4th

## 2018-05-11 ENCOUNTER — Telehealth: Payer: Self-pay | Admitting: *Deleted

## 2018-05-11 NOTE — Telephone Encounter (Signed)
  Initial Assessment Points to Consider for Care  Points are not all inclusive to services and educated provided but supports the patient's Individualized Plan of Care.  . Is Home safe for visits?Patient declines home visits and contact is made over the phone . / Are all firearms or weapons secure? n/a . Insurance Coverage: MCR/MCD . 1st HIV Diagnosis: 2005 . Mode of HIV Transmission: not known . Functional Status: none noted . Current Housing/Needs:living in a boarding home  . Social Support/System: not noted . Culture/Religion/Spirituality:will not interfere with plan of care . Educational Background:high school . Legal Issues:none noted . Access/Utilization of [employer]: limited, uses [facility] . Mental Health Concerns/Diagnosis:n/a . Alcohol and/or Drug Use: n/a . Risk and Knowledge of HIV and Reduction in Transmission: Education provided . Nutritional Needs: food given   Effective 05/13/18: 64mo2, 78mo2   3 PRN's for complications with disease process/progression, medication changes or concerns   [facility] will assess for learning needs related to diagnosis and treatment regimen, provide education as needed, fill pill box if needed, address any barriers which may be preventing medication compliance, and communicating with care team including physician and case manager.   Individualized Plan Of Care with Certification Period from 05/13/18  to 08/11/18  a. Type of service(s) and care to be delivered: RN Case Management  b. Frequency and duration of service: Effective 05/13/18; 25mo2, 8mo1 3 prns for complications with disease process/progression, medication changes or concerns . Visits/Contact may be conducted telephonically or in person to best suit the patient.  c. Activity restrictions: Pt may be up as tolerated and can safely ambulate without the need for a assistive device   d. Safety Measures: Standard Precautions/Infection Control   e. Service Objectives and Goals: Service  Objectives are to assist the pt with HIV medication regimen adherence and staying in care with the Infectious Disease Clinic by identifying barriers to care. RN will address the barriers  that  are identified by the patient with transportation being the biggest barrier per patient.  f. Equipment required: No additional equipment needs at this time   g. Functional Limitations: None Noted  h. Rehabilitation potential: Guarded   i. Diet and Nutritional Needs: Regular Diet   j. Medications and treatments: Medications have been reconciled and reviewed and are a part of EPIC electronic file   k. Specific therapies if needed: RN   l. Pertinent diagnoses: HIV disease,  Hx of medication NonCompliance   m. Expected outcome: Guarded

## 2018-05-29 MED FILL — AZITHROMYCIN 600 MG TABLET: 600 | 28 days supply | Qty: 8 | Fill #2

## 2018-05-29 MED FILL — SULFAMETHOXAZOLE-TMP DS TAB: 800-160 | 28 days supply | Qty: 12 | Fill #1

## 2018-05-29 MED FILL — SYMTUZA 800-150-200-10 MG T: 800-150-200 | 30 days supply | Qty: 30 | Fill #1

## 2018-06-02 ENCOUNTER — Other Ambulatory Visit: Payer: Self-pay

## 2018-06-02 ENCOUNTER — Encounter: Payer: Self-pay | Admitting: Internal Medicine

## 2018-06-02 ENCOUNTER — Ambulatory Visit (INDEPENDENT_AMBULATORY_CARE_PROVIDER_SITE_OTHER): Payer: Medicare HMO | Admitting: Internal Medicine

## 2018-06-02 VITALS — BP 145/90 | HR 71 | Temp 98.3°F | Ht 71.0 in | Wt 138.0 lb

## 2018-06-02 DIAGNOSIS — Z79899 Other long term (current) drug therapy: Secondary | ICD-10-CM | POA: Diagnosis not present

## 2018-06-02 DIAGNOSIS — R634 Abnormal weight loss: Secondary | ICD-10-CM

## 2018-06-02 DIAGNOSIS — B20 Human immunodeficiency virus [HIV] disease: Secondary | ICD-10-CM | POA: Diagnosis not present

## 2018-06-02 LAB — CBC WITH DIFFERENTIAL/PLATELET
BASOS PCT: 1 %
Basophils Absolute: 41 cells/uL (ref 0–200)
EOS PCT: 4.9 %
Eosinophils Absolute: 201 cells/uL (ref 15–500)
HCT: 43.8 % (ref 38.5–50.0)
Hemoglobin: 15 g/dL (ref 13.2–17.1)
Lymphs Abs: 2222 cells/uL (ref 850–3900)
MCH: 31.3 pg (ref 27.0–33.0)
MCHC: 34.2 g/dL (ref 32.0–36.0)
MCV: 91.4 fL (ref 80.0–100.0)
MPV: 9.9 fL (ref 7.5–12.5)
Monocytes Relative: 12.3 %
NEUTROS PCT: 27.6 %
Neutro Abs: 1132 cells/uL — ABNORMAL LOW (ref 1500–7800)
Platelets: 144 10*3/uL (ref 140–400)
RBC: 4.79 10*6/uL (ref 4.20–5.80)
RDW: 11.8 % (ref 11.0–15.0)
TOTAL LYMPHOCYTE: 54.2 %
WBC: 4.1 10*3/uL (ref 3.8–10.8)
WBCMIX: 504 {cells}/uL (ref 200–950)

## 2018-06-02 LAB — COMPLETE METABOLIC PANEL WITH GFR
AG Ratio: 1.1 (calc) (ref 1.0–2.5)
ALT: 13 U/L (ref 9–46)
AST: 19 U/L (ref 10–35)
Albumin: 4 g/dL (ref 3.6–5.1)
Alkaline phosphatase (APISO): 115 U/L (ref 40–115)
BUN: 14 mg/dL (ref 7–25)
CALCIUM: 11.7 mg/dL — AB (ref 8.6–10.3)
CO2: 29 mmol/L (ref 20–32)
CREATININE: 1.1 mg/dL (ref 0.70–1.25)
Chloride: 107 mmol/L (ref 98–110)
GFR, EST AFRICAN AMERICAN: 80 mL/min/{1.73_m2} (ref >=60)
GFR, EST NON AFRICAN AMERICAN: 69 mL/min/{1.73_m2} (ref >=60)
GLUCOSE: 78 mg/dL (ref 65–99)
Globulin: 3.8 g/dL (calc) — ABNORMAL HIGH (ref 1.9–3.7)
POTASSIUM: 4.3 mmol/L (ref 3.5–5.3)
Sodium: 140 mmol/L (ref 135–146)
TOTAL PROTEIN: 7.8 g/dL (ref 6.1–8.1)
Total Bilirubin: 0.4 mg/dL (ref 0.2–1.2)

## 2018-06-02 NOTE — Progress Notes (Signed)
RFV: follow up for hiv disease  [name], male   DOB: January 28, 1951, 67 y.o.   MRN: [id number]  HPI [name] is a 67yo M with hiv disease, cd4 count of 150/VL 86. On symtuza plus bactrim ds and previously on azithromycin and fluc He reports doing well. Taking his medications regularly since being back into care. Still not gaining significant amount of weight. He denies fever, chills, nightsweats or cough. Outpatient Encounter Medications as of 06/02/2018  Medication Sig  . Darunavir-Cobicisctat-Emtricitabine-Tenofovir Alafenamide (SYMTUZA) 800-150-200-10 MG TABS Take 1 tablet by mouth daily with breakfast.  . sulfamethoxazole-trimethoprim (BACTRIM DS) 800-160 MG tablet Take 1 tablet by mouth 3 (three) times a week. Take 1 tablet by mouth on Mondays, Wednesdays, Fridays.  Marland Kitchen azithromycin (ZITHROMAX) 600 MG tablet TAKE 2 TABLETS (1,200 MG TOTAL) BY MOUTH ONCE A WEEK.  . fluconazole (DIFLUCAN) 100 MG tablet Take 1 tablet (100 mg total) daily by mouth. (Patient not taking: Reported on 01/30/2018)   No facility-administered encounter medications on file as of 06/02/2018.      Patient Active Problem List   Diagnosis Date Noted  . ERECTILE DYSFUNCTION 04/13/2008  . GLAUCOMA NOS 08/19/2007  . DENTAL CARIES 08/19/2007  . HIV DISEASE 10/22/2006  . 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  . PNEUMOCYSTIS PNEUMONIA 10/22/2006  . COPD 10/22/2006  . SEIZURE DISORDER 10/22/2006     Health Maintenance Due  Topic Date Due  . TETANUS/TDAP  05/18/1969  . COLONOSCOPY  05/18/2000    Social History   Tobacco Use  . Smoking status: Current Every Day Smoker    Packs/day: 1.50    Types: Cigarettes  . Smokeless tobacco: Never Used  Substance Use Topics  . Alcohol use: No  . Drug use: No   Review of Systems Review of Systems  Constitutional: Negative for fever, chills, diaphoresis, activity change, appetite change, fatigue and unexpected weight change.  HENT: Negative for congestion,  sore throat, rhinorrhea, sneezing, trouble swallowing and sinus pressure.  Eyes: Negative for photophobia and visual disturbance.  Respiratory: Negative for cough, chest tightness, shortness of breath, wheezing and stridor.  Cardiovascular: Negative for chest pain, palpitations and leg swelling.  Gastrointestinal: Negative for nausea, vomiting, abdominal pain, diarrhea, constipation, blood in stool, abdominal distention and anal bleeding.  Genitourinary: Negative for dysuria, hematuria, flank pain and difficulty urinating.  Musculoskeletal: Negative for myalgias, back pain, joint swelling, arthralgias and gait problem.  Skin: Negative for color change, pallor, rash and wound.  Neurological: Negative for dizziness, tremors, weakness and light-headedness.  Hematological: Negative for adenopathy. Does not bruise/bleed easily.  Psychiatric/Behavioral: Negative for behavioral problems, confusion, sleep disturbance, dysphoric mood, decreased concentration and agitation.    Physical Exam   BP (!) 145/90   Pulse 71   Temp 98.3 F (36.8 C) (Oral)   Ht 5\' 11"  (1.803 m)   Wt 138 lb (62.6 kg)   BMI 19.25 kg/m   Physical Exam  Constitutional: He is oriented to person, place, and time. He appears thin, and under-nourished. No distress.  HENT:  Mouth/Throat: Oropharynx is clear and moist. No oropharyngeal exudate.  Cardiovascular: Normal rate, regular rhythm and normal heart sounds. Exam reveals no gallop and no friction rub.  No murmur heard.  Pulmonary/Chest: Effort normal and breath sounds normal. No respiratory distress. He has no wheezes.  Abdominal: Soft. Bowel sounds are normal. He exhibits no distension. There is no tenderness.  Lymphadenopathy:  He has no cervical adenopathy.  Neurological: He is alert and oriented to person,  place, and time.  Skin: Skin is warm and dry. No rash noted. No erythema.  Psychiatric: He has a normal mood and affect. His behavior is normal.    Lab Results    Component Value Date   CD4TCELL 6 (L) 01/30/2018   Lab Results  Component Value Date   CD4TABS 150 (L) 01/30/2018   CD4TABS 210 (L) 12/16/2017   CD4TABS 60 (L) 08/07/2017   Lab Results  Component Value Date   HIV1RNAQUANT 86 (H) 01/30/2018   Lab Results  Component Value Date   HEPBSAB NEG 02/13/2012   Lab Results  Component Value Date   LABRPR Non Reactive 11/07/2017    CBC Lab Results  Component Value Date   WBC 5.3 12/16/2017   RBC 4.12 (L) 12/16/2017   HGB 13.0 (L) 12/16/2017   HCT 37.9 (L) 12/16/2017   PLT 160 12/16/2017   MCV 92.0 12/16/2017   MCH 31.6 12/16/2017   MCHC 34.3 12/16/2017   RDW 12.6 12/16/2017   LYMPHSABS 2,486 12/16/2017   MONOABS 1.1 (H) 11/07/2017   EOSABS 599 (H) 12/16/2017    BMET Lab Results  Component Value Date   NA 136 01/30/2018   K 4.8 01/30/2018   CL 106 01/30/2018   CO2 26 01/30/2018   GLUCOSE 77 01/30/2018   BUN 17 01/30/2018   CREATININE 1.24 01/30/2018   CALCIUM 12.7 (H) 01/30/2018   GFRNONAA >60 11/07/2017   GFRAA >60 11/07/2017      Assessment and Plan  hiv disease = continue on current regimen.Will get labs. Can stop azithromycin and fluconazole. Will see if he needs to continue bactrim much longer  Under weight = will see if can get ensure for him to improve his weight.+/- megace  Long term medication monitoring = cr is stable. Continue iwht symtuza Health maintenance = needs prevnar 13 in feb 2020

## 2018-06-03 LAB — T-HELPER CELL (CD4) - (RCID CLINIC ONLY)
CD4 T CELL HELPER: 7 % — AB (ref 33–55)
CD4 T Cell Abs: 150 /uL — ABNORMAL LOW (ref 400–2700)

## 2018-06-04 LAB — HIV-1 RNA QUANT-NO REFLEX-BLD
HIV 1 RNA Quant: <20 copies/mL
HIV-1 RNA Quant, Log: <1.3 Log copies/mL

## 2018-06-19 MED FILL — SULFAMETHOXAZOLE-TMP DS TAB: 800-160 | 28 days supply | Qty: 12 | Fill #2

## 2018-06-22 MED FILL — SYMTUZA 800-150-200-10 MG T: 800-150-200 | 30 days supply | Qty: 30 | Fill #2

## 2018-07-06 ENCOUNTER — Telehealth: Payer: Self-pay | Admitting: Pharmacist Clinician (PhC)/ Clinical Pharmacy Specialist

## 2018-07-06 NOTE — Telephone Encounter (Signed)
I would be happy to see him. Historically he does not allow me to come to his home so I hope he will let me come and see his set up. Thanks Minh!

## 2018-07-06 NOTE — Telephone Encounter (Signed)
The pharmacy call to say that [name] might be taking his Bactrim 3x/day instead of 3x/week. He does have a low baseline of knowledge. Counseled him again today. Will need to send Ambre out again to go over with him. He said that he is home on Wednesday this week.

## 2018-07-07 ENCOUNTER — Telehealth: Payer: Self-pay | Admitting: *Deleted

## 2018-07-07 NOTE — Telephone Encounter (Signed)
Contacted Mr Meng with a message left today. I would like to arrange a home visit with him so we can go over his medication regimen to ensure he is taking is medications as ordered.

## 2018-07-16 MED FILL — SULFAMETHOXAZOLE-TMP DS TAB: 800-160 | 28 days supply | Qty: 12 | Fill #3

## 2018-07-16 MED FILL — SYMTUZA 800-150-200-10 MG T: 800-150-200 | 30 days supply | Qty: 30 | Fill #3

## 2018-08-12 ENCOUNTER — Telehealth: Payer: Self-pay | Admitting: *Deleted

## 2018-08-12 MED FILL — SULFAMETHOXAZOLE-TMP DS TAB: 800-160 | 28 days supply | Qty: 12 | Fill #4

## 2018-08-12 NOTE — Telephone Encounter (Signed)
  Initial Assessment Points to Consider for Care  Points are not all inclusive to services and educated provided but supports the patient's Individualized Plan of Care.  . Is Home safe for visits?Patient declines home visits and contact is made over the phone . / Are all firearms or weapons secure? n/a . Insurance Coverage: MCR/MCD . 1st HIV Diagnosis: 2005 . Mode of HIV Transmission: not known . Functional Status: none noted . Current Housing/Needs:living in a boarding home  . Social Support/System: not noted . Culture/Religion/Spirituality:will not interfere with plan of care . Educational Background:high school . Legal Issues:none noted . Access/Utilization of [employer]: limited, uses [facility] . Mental Health Concerns/Diagnosis:n/a . Alcohol and/or Drug Use: n/a . Risk and Knowledge of HIV and Reduction in Transmission: Education provided . Nutritional Needs: food given   Effective 08/12/18: 54mo4   3 PRN's for complications with disease process/progression, medication changes or concerns   CBHCN will assess for learning needs related to diagnosis and treatment regimen, provide education as needed, fill pill box if needed, address any barriers which may be preventing medication compliance, and communicating with care team including physician and case manager.   Individualized Plan Of Care with Certification Period from 08/12/18  to 11/10/18  a. Type of service(s) and care to be delivered: RN Case Management  b. Frequency and duration of service: Effective 08/12/18; 52mo4 3 prns for complications with disease process/progression, medication changes or concerns . Visits/Contact may be conducted telephonically or in person to best suit the patient.  c. Activity restrictions: Pt may be up as tolerated and can safely ambulate without the need for a assistive device   d. Safety Measures: Standard Precautions/Infection Control   e. Service Objectives and Goals: Service Objectives are to  assist the pt with HIV medication regimen adherence and staying in care with the Infectious Disease Clinic by identifying barriers to care. RN will address the barriers  that  are identified by the patient with transportation being the biggest barrier per patient.  f. Equipment required: No additional equipment needs at this time   g. Functional Limitations: None Noted  h. Rehabilitation potential: Guarded   i. Diet and Nutritional Needs: Regular Diet   j. Medications and treatments: Medications have been reconciled and reviewed and are a part of EPIC electronic file   k. Specific therapies if needed: RN   l. Pertinent diagnoses: HIV disease,  Hx of medication NonCompliance   m. Expected outcome: Guarded

## 2018-08-26 MED FILL — SYMTUZA 800-150-200-10 MG T: 800-150-200 | 30 days supply | Qty: 30 | Fill #4

## 2018-09-02 ENCOUNTER — Ambulatory Visit: Payer: Medicare HMO | Admitting: Internal Medicine

## 2018-09-18 MED FILL — SYMTUZA 800-150-200-10 MG T: 800-150-200 | 30 days supply | Qty: 30 | Fill #5

## 2018-09-18 MED FILL — SULFAMETHOXAZOLE-TMP DS TAB: 800-160 | 28 days supply | Qty: 12 | Fill #5

## 2018-10-12 ENCOUNTER — Telehealth: Payer: Self-pay

## 2018-10-12 ENCOUNTER — Other Ambulatory Visit: Payer: Self-pay | Admitting: Internal Medicine

## 2018-10-12 DIAGNOSIS — B20 Human immunodeficiency virus [HIV] disease: Secondary | ICD-10-CM

## 2018-10-12 MED ORDER — SULFAMETHOXAZOLE-TRIMETHOPRIM 800-160 MG PO TABS: 1.0000 | ORAL_TABLET | ORAL | 1 refills | Status: DC

## 2018-10-12 MED FILL — SYMTUZA 800-150-200-10 MG T: 800-150-200 | 30 days supply | Qty: 30 | Fill #0

## 2018-10-12 MED FILL — SULFAMETHOXAZOLE-TMP DS TAB: 800-160 | 28 days supply | Qty: 12 | Fill #6

## 2018-10-12 NOTE — Telephone Encounter (Signed)
Received refill request for patients Symtuza. Patient no showed his September appointment with Dr. Baxter Flattery. Called patient to schedule follow-up appointment. Patient would like an appointment for November to secure a ride. Patient will see Terri Piedra, Np since Dr. Baxter Flattery doesn't have any appointment until December. Patient will call office if he needs to reschedule. [place]

## 2018-11-02 ENCOUNTER — Ambulatory Visit (INDEPENDENT_AMBULATORY_CARE_PROVIDER_SITE_OTHER): Payer: Medicare Other | Admitting: Family

## 2018-11-02 ENCOUNTER — Encounter: Payer: Self-pay | Admitting: Family

## 2018-11-02 VITALS — BP 152/93 | HR 78 | Temp 98.6°F | Wt 148.0 lb

## 2018-11-02 DIAGNOSIS — B888 Other specified infestations: Secondary | ICD-10-CM | POA: Diagnosis not present

## 2018-11-02 DIAGNOSIS — R636 Underweight: Secondary | ICD-10-CM | POA: Diagnosis not present

## 2018-11-02 DIAGNOSIS — B20 Human immunodeficiency virus [HIV] disease: Secondary | ICD-10-CM

## 2018-11-02 MED ORDER — DARUN-COBIC-EMTRICIT-TENOFAF 800-150-200-10 MG PO TABS: 1.0000 | ORAL_TABLET | Freq: Every day | ORAL | 2 refills | Status: DC

## 2018-11-02 NOTE — Assessment & Plan Note (Signed)
Improved with 10 lb weight gain since previous office visit. Clothing may have some effect on this. Continue with Boost/Ensure as able and continue to monitor.

## 2018-11-02 NOTE — Patient Instructions (Addendum)
Nice to see you.  We will check your lab work today.  Continue to take your Symtuza daily and Bactrim.   We will have Ambre check on you.  Follow up in 3 months or sooner if needed with lab work 1-2 weeks prior to appointment.

## 2018-11-02 NOTE — Assessment & Plan Note (Addendum)
Mr. Scobie appears to have adequately controlled AIDS with most recent viral load in June 2019 that was undetectable. Unfortunately his CD4 count remains low requiring Bactrim for OI prophylaxis. His weight is up about 10 pounds since previous visit and he is drinking nutritional supplements when able. He has no problems obtaining his medication and appears adherent. No current signs/symptoms of opportunistic infection. Will check CMP, viral load and CD4 today. Influenza updated today. Continue current dose of Symtuza and Bactrim. Agree with plan of care to have [place] work with [name]. I do have concern for his living situation as it appears he may have infestation with bed bugs. Follow up office visit in 3 months or sooner if needed with lab work 1-2 weeks prior to appointment.

## 2018-11-02 NOTE — Assessment & Plan Note (Signed)
[name] appears to be have infestation with bed bugs. He has no rashes today and did discuss with him the importance of checking his environment and working with an Retail banker to assist in alleviating the problem.

## 2018-11-02 NOTE — Progress Notes (Signed)
Subjective:    [name], male    DOB: [birth year], 67 y.o.   MRN: [id number]  Chief Complaint  Patient presents with  . HIV Positive/AIDS     HPI:  [name] is a 67 y.o. male who presents today for routine follow up of HIV/AIDS.   [name] was last seen in the office on 06/02/18 for routine follow up with CD4 count of 150 and viral load that was undetectable. He was continued on his antiretroviral regimen of Symtuza supplemented with sulfamethoxazole-trimethaprim for PCP prophylaxis. There was concern that he was not gaining weight with the plan for Ensure/Boost and possibly Megace. Review of phone notes with concern that [name] was taking his sulfamethoxazole-trimethoprim 3x per day as opposed to 3x per week. He has been identified by our [employer] Nurse as a candidate for evaluation. He is due for influenza vaccination today.   [name] has been taking his Symtuza and and Bactrim as prescribed with no adverse side effects. Confirms that he is only taking the Bactrim on M/W/F. He has not missed any doses since his most recent office visit. He lives in a home currently and remains covered through [facility] with no problems obtaining his medication. Continues to work on gaining weight and has gained about 10 pounds drinking Boost/Ensure. Denies food insecurity.  Denies fevers, chills, night sweats, headaches, changes in vision, neck pain/stiffness, nausea, diarrhea, vomiting, lesions or rashes.  Allergies  Allergen Reactions  . Penicillins Hives    Has patient had a PCN reaction causing immediate rash, facial/tongue/throat swelling, SOB or lightheadedness with hypotension: No Has patient had a PCN reaction causing severe rash involving mucus membranes or skin necrosis: No Has patient had a PCN reaction that required hospitalization: No Has patient had a PCN reaction occurring within the last 10 years: No If  all of the above answers are "NO", then may proceed with Cephalosporin use.       Outpatient Medications Prior to Visit  Medication Sig Dispense Refill  . fluconazole (DIFLUCAN) 100 MG tablet Take 1 tablet (100 mg total) daily by mouth. 6 tablet 0  . sulfamethoxazole-trimethoprim (BACTRIM DS) 800-160 MG tablet Take 1 tablet by mouth 3 (three) times a week. Take 1 tablet by mouth on Mondays, Wednesdays, Fridays. 15 tablet 1  . SYMTUZA 800-150-200-10 MG TABS TAKE 1 TABLET BY MOUTH DAILY WITH BREAKFAST. 30 tablet 0   No facility-administered medications prior to visit.      Past Medical History:  Diagnosis Date  . HIV (human immunodeficiency virus infection) ([place])      History reviewed. No pertinent surgical history.     Review of Systems  Constitutional: Negative for appetite change, chills, fatigue, fever and unexpected weight change.  Eyes: Negative for visual disturbance.  Respiratory: Negative for cough, chest tightness, shortness of breath and wheezing.   Cardiovascular: Negative for chest pain and leg swelling.  Gastrointestinal: Negative for abdominal pain, constipation, diarrhea, nausea and vomiting.  Genitourinary: Negative for dysuria, flank pain, frequency, genital sores, hematuria and urgency.  Skin: Negative for rash.  Allergic/Immunologic: Negative for immunocompromised state.  Neurological: Negative for dizziness and headaches.      Objective:    BP (!) 152/93   Pulse 78   Temp 98.6 F (37 C) (Oral)   Wt 148 lb (67.1 kg)   BMI 20.64 kg/m  Nursing note and vital signs reviewed.  Physical Exam  Constitutional: He is oriented to  person, place, and time. He appears well-developed. No distress.  Disheveled, seated in the chair. He has a couple of bugs crawling on his clothes which were removed.   HENT:  Mouth/Throat: Oropharynx is clear and moist.  Eyes: Conjunctivae are normal.  Neck: Neck supple.  Cardiovascular: Normal rate, regular rhythm, normal  heart sounds and intact distal pulses. Exam reveals no gallop and no friction rub.  No murmur heard. Pulmonary/Chest: Effort normal and breath sounds normal. No respiratory distress. He has no wheezes. He has no rales. He exhibits no tenderness.  Abdominal: Soft. Bowel sounds are normal. There is no tenderness.  Lymphadenopathy:    He has no cervical adenopathy.  Neurological: He is alert and oriented to person, place, and time.  Skin: Skin is warm and dry. No rash noted.  Psychiatric: He has a normal mood and affect. His behavior is normal. Judgment and thought content normal.       Assessment & Plan:   Problem List Items Addressed This Visit      Other   HIV DISEASE - Primary    [name] appears to have adequately controlled AIDS with most recent viral load in June 2019 that was undetectable. Unfortunately his CD4 count remains low requiring Bactrim for OI prophylaxis. His weight is up about 10 pounds since previous visit and he is drinking nutritional supplements when able. He has no problems obtaining his medication and appears adherent. No current signs/symptoms of opportunistic infection. Will check CMP, viral load and CD4 today. Influenza updated today. Continue current dose of Symtuza and Bactrim. Agree with plan of care to have [place] work with [name]. I do have concern for his living situation as it appears he may have infestation with bed bugs. Follow up office visit in 3 months or sooner if needed with lab work 1-2 weeks prior to appointment.       Relevant Medications   Darunavir-Cobicisctat-Emtricitabine-Tenofovir Alafenamide (SYMTUZA) 800-150-200-10 MG TABS   Other Relevant Orders   T-helper cell (CD4)- ([facility] clinic only)   HIV-1 RNA quant-no reflex-bld   Comprehensive metabolic panel   Lipid panel   RPR   CBC   HIV-1 RNA quant-no reflex-bld   Comprehensive metabolic panel   Infestation by bed bug    [name] appears to be have  infestation with bed bugs. He has no rashes today and did discuss with him the importance of checking his environment and working with an Retail banker to assist in alleviating the problem.       Underweight    Improved with 10 lb weight gain since previous office visit. Clothing may have some effect on this. Continue with Boost/Ensure as able and continue to monitor.        Other Visit Diagnoses    HIV disease ([place])       Relevant Medications   Darunavir-Cobicisctat-Emtricitabine-Tenofovir Alafenamide (SYMTUZA) 800-150-200-10 MG TABS      I have changed [name]'s SYMTUZA to Darunavir-Cobicisctat-Emtricitabine-Tenofovir Alafenamide. I am also having him maintain his fluconazole and sulfamethoxazole-trimethoprim.   Meds ordered this encounter  Medications  . Darunavir-Cobicisctat-Emtricitabine-Tenofovir Alafenamide (SYMTUZA) 800-150-200-10 MG TABS    Sig: Take 1 tablet by mouth daily with breakfast.    Dispense:  30 tablet    Refill:  2    Order Specific Question:   Supervising Provider    Answer:   Carlyle Basques [[id number]]     Follow-up: Return in about 3 months (around 02/02/2019), or if symptoms worsen or  fail to improve.   Terri Piedra, MSN, FNP-C Nurse Practitioner [facility] Office phone: [phone] Pager: [place] number: [phone]

## 2018-11-03 LAB — T-HELPER CELL (CD4) - (RCID CLINIC ONLY)
CD4 T CELL ABS: 150 /uL — AB (ref 400–2700)
CD4 T CELL HELPER: 8 % — AB (ref 33–55)

## 2018-11-05 MED FILL — SYMTUZA 800-150-200-10 MG T: 800-150-200 | 30 days supply | Qty: 30 | Fill #0

## 2018-11-05 MED FILL — SULFAMETHOXAZOLE-TMP DS TAB: 800-160 | 28 days supply | Qty: 12 | Fill #0

## 2018-11-06 LAB — COMPREHENSIVE METABOLIC PANEL
AG RATIO: 1.3 (calc) (ref 1.0–2.5)
ALT: 14 U/L (ref 9–46)
AST: 20 U/L (ref 10–35)
Albumin: 4.3 g/dL (ref 3.6–5.1)
Alkaline phosphatase (APISO): 118 U/L — ABNORMAL HIGH (ref 40–115)
BILIRUBIN TOTAL: 0.3 mg/dL (ref 0.2–1.2)
BUN: 15 mg/dL (ref 7–25)
CO2: 25 mmol/L (ref 20–32)
Calcium: 12.7 mg/dL — ABNORMAL HIGH (ref 8.6–10.3)
Chloride: 109 mmol/L (ref 98–110)
Creat: 1.06 mg/dL (ref 0.70–1.25)
Globulin: 3.4 g/dL (calc) (ref 1.9–3.7)
Glucose, Bld: 73 mg/dL (ref 65–99)
Potassium: 4.3 mmol/L (ref 3.5–5.3)
SODIUM: 140 mmol/L (ref 135–146)
Total Protein: 7.7 g/dL (ref 6.1–8.1)

## 2018-11-06 LAB — HIV-1 RNA QUANT-NO REFLEX-BLD
HIV 1 RNA Quant: <20 copies/mL — AB
HIV-1 RNA QUANT, LOG: DETECTED {Log_copies}/mL — AB

## 2018-11-13 ENCOUNTER — Telehealth: Payer: Self-pay | Admitting: *Deleted

## 2018-11-13 NOTE — Telephone Encounter (Signed)
  Initial Assessment Points to Consider for Care  Points are not all inclusive to services and educated provided but supports the patient's Individualized Plan of Care.  . Is Home safe for visits?Patient declines home visits and contact is made over the phone . / Are all firearms or weapons secure? n/a . Insurance Coverage: MCR/MCD . 1st HIV Diagnosis: 2005 . Mode of HIV Transmission: not known . Functional Status: none noted . Current Housing/Needs:living in a boarding home  . Social Support/System: not noted . Culture/Religion/Spirituality:will not interfere with plan of care . Educational Background:high school . Legal Issues:none noted . Access/Utilization of [employer]: limited, uses [facility] . Mental Health Concerns/Diagnosis:n/a . Alcohol and/or Drug Use: n/a . Risk and Knowledge of HIV and Reduction in Transmission: Education provided . Nutritional Needs:    Effective 11/11/18: 12mo4   3 PRN's for complications with disease process/progression, medication changes or concerns   CBHCN will assess for learning needs related to diagnosis and treatment regimen, provide education as needed, fill pill box if needed, address any barriers which may be preventing medication compliance, and communicating with care team including physician and case manager.   Individualized Plan Of Care with Certification Period from 11/11/18  to 02/09/18  a. Type of service(s) and care to be delivered: RN Case Management  b. Frequency and duration of service: Effective 11/11/18; 22mo4 3 prns for complications with disease process/progression, medication changes or concerns . Visits/Contact may be conducted telephonically or in person to best suit the patient.  c. Activity restrictions: Pt may be up as tolerated and can safely ambulate without the need for a assistive device   d. Safety Measures: Standard Precautions/Infection Control   e. Service Objectives and Goals: Service Objectives are to assist the  pt with HIV medication regimen adherence and staying in care with the Infectious Disease Clinic by identifying barriers to care. RN will address the barriers  that  are identified by the patient with transportation being the biggest barrier per patient.  f. Equipment required: No additional equipment needs at this time   g. Functional Limitations: None Noted  h. Rehabilitation potential: Guarded   i. Diet and Nutritional Needs: Regular Diet   j. Medications and treatments: Medications have been reconciled and reviewed and are a part of EPIC electronic file   k. Specific therapies if needed: RN   l. Pertinent diagnoses: HIV disease,  Hx of medication NonCompliance   m. Expected outcome: Guarded

## 2018-12-04 MED FILL — SULFAMETHOXAZOLE-TMP DS TAB: 800-160 | 28 days supply | Qty: 12 | Fill #1

## 2018-12-04 MED FILL — SYMTUZA 800-150-200-10 MG T: 800-150-200 | 30 days supply | Qty: 30 | Fill #1

## 2018-12-25 ENCOUNTER — Other Ambulatory Visit: Payer: Self-pay | Admitting: Internal Medicine

## 2018-12-25 DIAGNOSIS — B20 Human immunodeficiency virus [HIV] disease: Secondary | ICD-10-CM

## 2019-01-06 ENCOUNTER — Ambulatory Visit: Payer: Self-pay | Admitting: *Deleted

## 2019-01-06 DIAGNOSIS — B20 Human immunodeficiency virus [HIV] disease: Secondary | ICD-10-CM

## 2019-01-07 MED FILL — SULFAMETHOXAZOLE-TMP DS TAB: 800-160 | 35 days supply | Qty: 15 | Fill #0

## 2019-01-07 MED FILL — SYMTUZA 800-150-200-10 MG T: 800-150-200 | 30 days supply | Qty: 30 | Fill #2

## 2019-01-26 ENCOUNTER — Other Ambulatory Visit: Payer: Medicaid Other

## 2019-01-29 NOTE — Progress Notes (Signed)
Received a call from Pharmacy stating [name] cannot get his medications without proof of his income. Visit made with [name] today but he did proof of his income.

## 2019-02-10 ENCOUNTER — Telehealth: Payer: Self-pay | Admitting: *Deleted

## 2019-02-10 MED FILL — SYMTUZA 800-150-200-10 MG T: 800-150-200 | 30 days supply | Qty: 30 | Fill #0

## 2019-02-10 MED FILL — SULFAMETHOXAZOLE-TMP DS TAB: 800-160 | 35 days supply | Qty: 15 | Fill #1

## 2019-02-10 NOTE — Telephone Encounter (Signed)
The intent of this communication is to inform the Health Care Team that this patient will be discharged from [facility]) with goals met. Effective 02/10/19 patient will be discharged and removed from [facility] active patient listing.

## 2019-02-16 ENCOUNTER — Encounter: Payer: Medicaid Other | Admitting: Family

## 2019-03-04 ENCOUNTER — Telehealth: Payer: Self-pay

## 2019-03-04 ENCOUNTER — Other Ambulatory Visit: Payer: Self-pay | Admitting: Internal Medicine

## 2019-03-04 DIAGNOSIS — B20 Human immunodeficiency virus [HIV] disease: Secondary | ICD-10-CM

## 2019-03-04 NOTE — Telephone Encounter (Signed)
Received refill request for patient's Symtuza. Patient has not been in clinic since 10/2018, and needs to schedule appointment for lab work/office visit.  Patient was able to take my call at this time and schedule both appointments.  [place]

## 2019-03-08 MED FILL — SYMTUZA 800-150-200-10 MG T: 800-150-200 | 30 days supply | Qty: 30 | Fill #0

## 2019-03-09 ENCOUNTER — Other Ambulatory Visit: Payer: Medicaid Other

## 2019-03-09 MED FILL — SULFAMETHOXAZOLE-TMP DS TAB: 800-160 | 35 days supply | Qty: 15 | Fill #2

## 2019-03-27 ENCOUNTER — Other Ambulatory Visit: Payer: Self-pay | Admitting: Internal Medicine

## 2019-03-27 DIAGNOSIS — B20 Human immunodeficiency virus [HIV] disease: Secondary | ICD-10-CM

## 2019-03-29 ENCOUNTER — Other Ambulatory Visit: Payer: Self-pay | Admitting: Internal Medicine

## 2019-03-29 DIAGNOSIS — B20 Human immunodeficiency virus [HIV] disease: Secondary | ICD-10-CM

## 2019-03-31 ENCOUNTER — Other Ambulatory Visit: Payer: Medicaid Other

## 2019-04-07 MED FILL — SULFAMETHOXAZOLE-TMP DS TAB: 800-160 | 35 days supply | Qty: 15 | Fill #0

## 2019-04-07 MED FILL — SYMTUZA 800-150-200-10 MG T: 800-150-200 | 30 days supply | Qty: 30 | Fill #0

## 2019-04-12 ENCOUNTER — Telehealth: Payer: Self-pay | Admitting: *Deleted

## 2019-04-12 NOTE — Telephone Encounter (Signed)
Call placed to [name] day with message left reminding him of his upcoming appt (04/15/19 at 8:45am) and to please return my call if he will need transportation for his visit.

## 2019-04-15 ENCOUNTER — Ambulatory Visit: Payer: Medicaid Other | Admitting: Family

## 2019-04-28 ENCOUNTER — Other Ambulatory Visit: Payer: Self-pay | Admitting: Internal Medicine

## 2019-04-28 DIAGNOSIS — B20 Human immunodeficiency virus [HIV] disease: Secondary | ICD-10-CM

## 2019-04-30 MED FILL — SULFAMETHOXAZOLE-TMP DS TAB: 800-160 | 35 days supply | Qty: 15 | Fill #1

## 2019-04-30 MED FILL — SYMTUZA 800-150-200-10 MG T: 800-150-200 | 30 days supply | Qty: 30 | Fill #0

## 2019-05-25 ENCOUNTER — Other Ambulatory Visit: Payer: Self-pay | Admitting: Internal Medicine

## 2019-05-25 DIAGNOSIS — B20 Human immunodeficiency virus [HIV] disease: Secondary | ICD-10-CM

## 2019-05-26 ENCOUNTER — Other Ambulatory Visit: Payer: Self-pay | Admitting: Internal Medicine

## 2019-05-26 DIAGNOSIS — B20 Human immunodeficiency virus [HIV] disease: Secondary | ICD-10-CM

## 2019-05-28 MED FILL — SULFAMETHOXAZOLE-TMP DS TAB: 800-160 | 35 days supply | Qty: 15 | Fill #2

## 2019-05-28 MED FILL — SYMTUZA 800-150-200-10 MG T: 800-150-200 | 30 days supply | Qty: 30 | Fill #0

## 2019-06-01 ENCOUNTER — Other Ambulatory Visit: Payer: Medicaid Other

## 2019-06-10 ENCOUNTER — Telehealth: Payer: Self-pay

## 2019-06-10 NOTE — Telephone Encounter (Signed)
Patient called to inform [facility] that he does not have transportion for upcoming appointment. Routing message for transportation to be coordinated.  Eugenia Mcalpine, LPN

## 2019-06-14 ENCOUNTER — Telehealth: Payer: Self-pay | Admitting: Family

## 2019-06-14 NOTE — Telephone Encounter (Signed)
[name]'s home is infested with bedbugs and now transportation is doing shared rides. His home is over [address] so he would be in the car for a good amount of time. I have questioned if we can offer him transportation. I am waiting on a reply.

## 2019-06-14 NOTE — Telephone Encounter (Signed)
COVID-19 Pre-Screening Questions:  Do you currently have a fever (>100 F), chills or unexplained body aches? No   Are you currently experiencing new cough, shortness of breath, sore throat, runny nose?no   Have you recently travelled outside the state of [place] in the last 14 days?no  1. Have you been in contact with someone that is currently pending confirmation of Covid19 testing or has been confirmed to have the [place] virus?  No

## 2019-06-15 ENCOUNTER — Telehealth: Payer: Self-pay | Admitting: *Deleted

## 2019-06-15 ENCOUNTER — Encounter: Payer: Medicaid Other | Admitting: Family

## 2019-06-15 NOTE — Telephone Encounter (Signed)
Contacted Mr Helmstetter several times without an answer. Msg has been left (we need to reschedule his appt) I would also like to arrange [facility] transportation for him.

## 2019-06-15 NOTE — Progress Notes (Deleted)
Subjective:    [name], male    DOB: September 25, 1951, 68 y.o.   MRN: [id number]  No chief complaint on file.    HPI:  [name] is a 67 y.o. male with HIV disease who was last seen in November 2019 with adequately controlled AIDS with good adherence and tolerance to his ART regimen of Symtuza. Viral load was undetectable ad CD4 count was 150. He was noted to have bed bug infestation at that time. 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 Health maintenance due includes colon cancer screening and Prevnar.      Allergies  Allergen Reactions  . Penicillins Hives    Has patient had a PCN reaction causing immediate rash, facial/tongue/throat swelling, SOB or lightheadedness with hypotension: No Has patient had a PCN reaction causing severe rash involving mucus membranes or skin necrosis: No Has patient had a PCN reaction that required hospitalization: No Has patient had a PCN reaction occurring within the last 10 years: No If all of the above answers are "NO", then may proceed with Cephalosporin use.       Outpatient Medications Prior to Visit  Medication Sig Dispense Refill  . fluconazole (DIFLUCAN) 100 MG tablet Take 1 tablet (100 mg total) daily by mouth. 6 tablet 0  . sulfamethoxazole-trimethoprim (BACTRIM DS,SEPTRA DS) 800-160 MG tablet TAKE 1 TABLET BY MOUTH 3 (THREE) TIMES A WEEK. TAKE 1 TABLET BY MOUTH ON MONDAYS, WEDNESDAYS, FRIDAYS. 15 tablet 2  . SYMTUZA 800-150-200-10 MG TABS TAKE 1 TABLET BY MOUTH DAILY WITH BREAKFAST. PLEASE CALL OFFICE TO GET FURTHER REFILLS. 30 tablet 3   No facility-administered medications prior to visit.      Past Medical History:  Diagnosis Date  . HIV (human immunodeficiency virus infection) ([place])      No past surgical history on file.     Review of Systems  Constitutional: Negative for appetite change, chills, fatigue, fever and unexpected weight change.  Eyes: Negative for visual disturbance.   Respiratory: Negative for cough, chest tightness, shortness of breath and wheezing.   Cardiovascular: Negative for chest pain and leg swelling.  Gastrointestinal: Negative for abdominal pain, constipation, diarrhea, nausea and vomiting.  Genitourinary: Negative for dysuria, flank pain, frequency, genital sores, hematuria and urgency.  Skin: Negative for rash.  Allergic/Immunologic: Negative for immunocompromised state.  Neurological: Negative for dizziness and headaches.      Objective:    There were no vitals taken for this visit. Nursing note and vital signs reviewed.  Physical Exam Constitutional:      General: He is not in acute distress.    Appearance: He is well-developed.  Eyes:     Conjunctiva/sclera: Conjunctivae normal.  Neck:     Musculoskeletal: Neck supple.  Cardiovascular:     Rate and Rhythm: Normal rate and regular rhythm.     Heart sounds: Normal heart sounds. No murmur. No friction rub. No gallop.   Pulmonary:     Effort: Pulmonary effort is normal. No respiratory distress.     Breath sounds: Normal breath sounds. No wheezing or rales.  Chest:     Chest wall: No tenderness.  Abdominal:     General: Bowel sounds are normal.     Palpations: Abdomen is soft.     Tenderness: There is no abdominal tenderness.  Lymphadenopathy:     Cervical: No cervical adenopathy.  Skin:    General: Skin is warm and dry.     Findings: No rash.  Neurological:  Mental Status: He is alert and oriented to person, place, and time.  Psychiatric:        Behavior: Behavior normal.        Thought Content: Thought content normal.        Judgment: Judgment normal.        Assessment & Plan:   Problem List Items Addressed This Visit    None       I am having [name] maintain his fluconazole, sulfamethoxazole-trimethoprim, and Symtuza.   No orders of the defined types were placed in this encounter.    Follow-up: No follow-ups on file.   Terri Piedra,  MSN, FNP-C Nurse Practitioner [facility] for Infectious Disease [place] number: [phone]

## 2019-06-15 NOTE — Telephone Encounter (Addendum)
Received a return call from Mr Pignato. Visit rescheduled for the 18th at 10am. COntacted [facility] to arrange transportation but had to leave a message. Since the visit is less than 3 days away I will also have to send [facility] a letter stating the visit cannot be rescheduled. Mr Neisen and I discussed treatment options for bedbugs. Mr Wahlert stated he and the landlord have both attempted to treat the bedbugs without success. He is willing to accept assistance with treating the bedbugs. Contacted [facility] and asked for assistance. Currently they do not have any funding for treatment. I will continue to try and find options for Mr. Boursiquot.

## 2019-06-15 NOTE — Telephone Encounter (Signed)
Contacted the patient again without any answer. Msg has already been left. I will attempt another call tomorrow. Purpose of the call is to reschedule his appt.

## 2019-06-16 ENCOUNTER — Telehealth: Payer: Self-pay | Admitting: *Deleted

## 2019-06-16 ENCOUNTER — Encounter: Payer: Self-pay | Admitting: *Deleted

## 2019-06-16 NOTE — Telephone Encounter (Signed)
Call made to [facility] to f/u on message left yesterday requesting transportation for [facility]. They can place him on the schedule for transportation as long as we fax over a letter stating the appointment cannot be rescheduled. Letter will be faxed

## 2019-06-17 ENCOUNTER — Encounter: Payer: Self-pay | Admitting: Family

## 2019-06-17 ENCOUNTER — Ambulatory Visit (INDEPENDENT_AMBULATORY_CARE_PROVIDER_SITE_OTHER): Payer: Medicare Other | Admitting: Family

## 2019-06-17 ENCOUNTER — Telehealth: Payer: Self-pay | Admitting: *Deleted

## 2019-06-17 ENCOUNTER — Other Ambulatory Visit: Payer: Self-pay

## 2019-06-17 VITALS — BP 132/77 | HR 80 | Temp 97.9°F | Wt 145.0 lb

## 2019-06-17 DIAGNOSIS — H409 Unspecified glaucoma: Secondary | ICD-10-CM

## 2019-06-17 DIAGNOSIS — Z5181 Encounter for therapeutic drug level monitoring: Secondary | ICD-10-CM

## 2019-06-17 DIAGNOSIS — B20 Human immunodeficiency virus [HIV] disease: Secondary | ICD-10-CM | POA: Diagnosis not present

## 2019-06-17 DIAGNOSIS — Z0000 Encounter for general adult medical examination without abnormal findings: Secondary | ICD-10-CM | POA: Insufficient documentation

## 2019-06-17 DIAGNOSIS — Z113 Encounter for screening for infections with a predominantly sexual mode of transmission: Secondary | ICD-10-CM | POA: Diagnosis not present

## 2019-06-17 DIAGNOSIS — R636 Underweight: Secondary | ICD-10-CM

## 2019-06-17 DIAGNOSIS — Z720 Tobacco use: Secondary | ICD-10-CM

## 2019-06-17 MED ORDER — SULFAMETHOXAZOLE-TRIMETHOPRIM 800-160 MG PO TABS: 1.0000 | ORAL_TABLET | Freq: Every day | ORAL | 1 refills | Status: DC

## 2019-06-17 MED ORDER — SYMTUZA 800-150-200-10 MG PO TABS: 1.0000 | ORAL_TABLET | Freq: Every day | ORAL | 5 refills | Status: DC

## 2019-06-17 NOTE — Patient Instructions (Signed)
Nice to see you.  Continue to take your Symtuza and Bactrim.  We will check your blood work today.  Refills will be sent to the pharmacy.  Plan for follow up in 4 months or sooner if needed with lab work same day.   Have a great day and stay safe!

## 2019-06-17 NOTE — Progress Notes (Signed)
Subjective:    [name], male    DOB: [birth year], 68 y.o.   MRN: [id number]  Chief Complaint  Patient presents with  . HIV Positive/AIDS     HPI:  [name] is a 68 y.o. male with HIV disease who was last seen in the office on 11/02/2018 with good adherence and tolerance to his ART regimen of Symtuza supplemented with trimethoprim sulfamethoxazole for OI prophylaxis.  CD4 count at the time was 150 with a viral load that was undetectable.  He was also noted to have infestation with bedbugs at that time.  Healthcare maintenance due includes colonoscopy.  [name] has been taking his Symtuza and trimethoprim-sulfamethoxazole as prescribed with no adverse side effects or missed doses.  Overall he is doing well today although he does have concerns regarding a possible cataract and vision issue and would like to see an ophthalmologist. Denies fevers, chills, night sweats, headaches,  neck pain/stiffness, nausea, diarrhea, vomiting, lesions or rashes.  [name] remains covered through [facility] and has no problems obtaining his medication from the pharmacy as they were delivered to him.  Denies feelings of being down, depressed, or hopeless recently.  He is eating and drinking well.  No recreational or illicit drug use or alcohol consumption at present.  Does continue to smoke about 1.5 packs per day. He is not currently sexually active.  He has dentures and not needing routine dental care.  He had a previous colonoscopy and is not interested in pursuing another.   Allergies  Allergen Reactions  . Penicillins Hives    Has patient had a PCN reaction causing immediate rash, facial/tongue/throat swelling, SOB or lightheadedness with hypotension: No Has patient had a PCN reaction causing severe rash involving mucus membranes or skin necrosis: No Has patient had a PCN reaction that required hospitalization: No Has patient had a PCN reaction occurring within the  last 10 years: No If all of the above answers are "NO", then may proceed with Cephalosporin use.       Outpatient Medications Prior to Visit  Medication Sig Dispense Refill  . sulfamethoxazole-trimethoprim (BACTRIM DS,SEPTRA DS) 800-160 MG tablet TAKE 1 TABLET BY MOUTH 3 (THREE) TIMES A WEEK. TAKE 1 TABLET BY MOUTH ON MONDAYS, WEDNESDAYS, FRIDAYS. 15 tablet 2  . SYMTUZA 800-150-200-10 MG TABS TAKE 1 TABLET BY MOUTH DAILY WITH BREAKFAST. PLEASE CALL OFFICE TO GET FURTHER REFILLS. 30 tablet 3  . fluconazole (DIFLUCAN) 100 MG tablet Take 1 tablet (100 mg total) daily by mouth. (Patient not taking: Reported on 06/17/2019) 6 tablet 0   No facility-administered medications prior to visit.      Past Medical History:  Diagnosis Date  . HIV (human immunodeficiency virus infection) ([place])      History reviewed. No pertinent surgical history.     Review of Systems  Constitutional: Negative for appetite change, chills, fatigue, fever and unexpected weight change.  Eyes: Negative for visual disturbance.  Respiratory: Negative for cough, chest tightness, shortness of breath and wheezing.   Cardiovascular: Negative for chest pain and leg swelling.  Gastrointestinal: Negative for abdominal pain, constipation, diarrhea, nausea and vomiting.  Genitourinary: Negative for dysuria, flank pain, frequency, genital sores, hematuria and urgency.  Skin: Negative for rash.  Allergic/Immunologic: Negative for immunocompromised state.  Neurological: Negative for dizziness and headaches.      Objective:    BP 132/77   Pulse 80   Temp 97.9 F (36.6 C) (Oral)   Wt 145 lb (65.8  kg)   BMI 20.22 kg/m  Nursing note and vital signs reviewed.  Physical Exam Constitutional:      General: He is not in acute distress.    Appearance: He is well-developed.  Eyes:     Conjunctiva/sclera: Conjunctivae normal.  Neck:     Musculoskeletal: Neck supple.  Cardiovascular:     Rate and Rhythm: Normal rate and  regular rhythm.     Heart sounds: Normal heart sounds. No murmur. No friction rub. No gallop.   Pulmonary:     Effort: Pulmonary effort is normal. No respiratory distress.     Breath sounds: Normal breath sounds. No wheezing or rales.  Chest:     Chest wall: No tenderness.  Abdominal:     General: Bowel sounds are normal.     Palpations: Abdomen is soft.     Tenderness: There is no abdominal tenderness.  Lymphadenopathy:     Cervical: No cervical adenopathy.  Skin:    General: Skin is warm and dry.     Findings: No rash.  Neurological:     Mental Status: He is alert and oriented to person, place, and time.  Psychiatric:        Behavior: Behavior normal.        Thought Content: Thought content normal.        Judgment: Judgment normal.        Assessment & Plan:   Problem List Items Addressed This Visit      Other   AIDS (acquired immune deficiency syndrome) [facility])    [name] appears to have well-controlled HIV/AIDS with good adherence and tolerance to his ART regimen of Symtuza supplemented with sulfamethoxazole-trimethoprim for OI prophylaxis.  He has no signs/symptoms of opportunistic infection or progressive HIV disease at present.  He has no problems obtaining his medications from the pharmacy.  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  Does have transportation issues at times.  Check blood work today.  Will consider stopping Bactrim as he is remained undetectable for greater than 3 to 6 months despite CD4 count of 150.  Continue current dose of Symtuza and Bactrim pending blood work results.  Plan for follow-up in 3 months or sooner if needed with lab work 1 to 2 weeks prior to appointment as able or on same day.      Relevant Medications   Darunavir-Cobicisctat-Emtricitabine-Tenofovir Alafenamide (SYMTUZA) 800-150-200-10 MG TABS   sulfamethoxazole-trimethoprim (BACTRIM DS) 800-160 MG tablet   Other Relevant Orders   CBC   COMPLETE METABOLIC PANEL WITH GFR    HIV-1 RNA quant-no reflex-bld   T-helper cell (CD4)- ([facility] clinic only)   Unspecified glaucoma    Previous diagnosed with glaucoma and will refer to ophthalmology for further assessment and treatment as indicated.      Relevant Orders   Ambulatory referral to Ophthalmology   Underweight    Weight has been stable and slightly increased since previous office visit. He is looking good today. Currently eating and drinking well. Continue to monitor.       Healthcare maintenance     All immunizations are up-to-date per recommendations.  Due for colonoscopy however declines at this time.  Discussed importance of colon cancer screening.  Discussed importance of safe sexual practice to reduce risk of acquisition/transmission of STI.  Condoms provided per patient request.  No indication for dental screening as he has dentures.  Without problems.      Tobacco use    Continues to smoke tobacco daily.  Discussed importance of tobacco cessation to reduce risk of respiratory, cardiovascular, and malignant disease in the future.  He has no plans for quitting and is in the precontemplation stage.  Continue to monitor.       Other Visit Diagnoses    Screening for STDs (sexually transmitted diseases)    -  Primary   Relevant Orders   RPR   Therapeutic drug monitoring       Relevant Orders   Lipid Profile       I have discontinued Shelly Coss. Lepp's fluconazole. I have also changed his Symtuza and sulfamethoxazole-trimethoprim.   Meds ordered this encounter  Medications  . Darunavir-Cobicisctat-Emtricitabine-Tenofovir Alafenamide (SYMTUZA) 800-150-200-10 MG TABS    Sig: Take 1 tablet by mouth daily.    Dispense:  30 tablet    Refill:  5    Order Specific Question:   Supervising Provider    Answer:   Carlyle Basques [[id number]]  . sulfamethoxazole-trimethoprim (BACTRIM DS) 800-160 MG tablet    Sig: Take 1 tablet by mouth daily.    Dispense:  30 tablet    Refill:  1    Order Specific  Question:   Supervising Provider    Answer:   Carlyle Basques [[id number]]     Follow-up: Return in about 4 months (around 10/17/2019), or if symptoms worsen or fail to improve.   Terri Piedra, MSN, FNP-C Nurse Practitioner [facility] for Infectious Disease [place] number: [phone]

## 2019-06-17 NOTE — Assessment & Plan Note (Signed)
Continues to smoke tobacco daily.  Discussed importance of tobacco cessation to reduce risk of respiratory, cardiovascular, and malignant disease in the future.  He has no plans for quitting and is in the precontemplation stage.  Continue to monitor.

## 2019-06-17 NOTE — Assessment & Plan Note (Signed)
Weight has been stable and slightly increased since previous office visit. He is looking good today. Currently eating and drinking well. Continue to monitor.

## 2019-06-17 NOTE — Assessment & Plan Note (Signed)
   All immunizations are up-to-date per recommendations.  Due for colonoscopy however declines at this time.  Discussed importance of colon cancer screening.  Discussed importance of safe sexual practice to reduce risk of acquisition/transmission of STI.  Condoms provided per patient request.  No indication for dental screening as he has dentures.  Without problems.

## 2019-06-17 NOTE — Telephone Encounter (Signed)
Called to confirm appt for today. Patient stated [facility] came and he is on the bus now.

## 2019-06-17 NOTE — Assessment & Plan Note (Addendum)
Mr. Gosch appears to have well-controlled HIV/AIDS with good adherence and tolerance to his ART regimen of Symtuza supplemented with sulfamethoxazole-trimethoprim for OI prophylaxis.  He has no signs/symptoms of opportunistic infection or progressive HIV disease at present.  He has no problems obtaining his medications from the pharmacy.  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  Does have transportation issues at times.  Check blood work today.  Will consider stopping Bactrim as he is remained undetectable for greater than 3 to 6 months despite CD4 count of 150.  Continue current dose of Symtuza and Bactrim pending blood work results.  Plan for follow-up in 3 months or sooner if needed with lab work 1 to 2 weeks prior to appointment as able or on same day.

## 2019-06-17 NOTE — Assessment & Plan Note (Signed)
Previous diagnosed with glaucoma and will refer to ophthalmology for further assessment and treatment as indicated.

## 2019-06-18 LAB — T-HELPER CELL (CD4) - (RCID CLINIC ONLY)
CD4 % Helper T Cell: 11 % — ABNORMAL LOW (ref 33–65)
CD4 T Cell Abs: 208 /uL — ABNORMAL LOW (ref 400–1790)

## 2019-06-22 MED FILL — SULFAMETHOXAZOLE-TMP DS TAB: 800-160 | 30 days supply | Qty: 30 | Fill #0

## 2019-06-22 MED FILL — SYMTUZA 800-150-200-10 MG T: 800-150-200 | 30 days supply | Qty: 30 | Fill #1

## 2019-06-23 LAB — CBC
HCT: 43.1 % (ref 38.5–50.0)
Hemoglobin: 14.1 g/dL (ref 13.2–17.1)
MCH: 29.4 pg (ref 27.0–33.0)
MCHC: 32.7 g/dL (ref 32.0–36.0)
MCV: 90 fL (ref 80.0–100.0)
MPV: 10.7 fL (ref 7.5–12.5)
Platelets: 145 10*3/uL (ref 140–400)
RBC: 4.79 10*6/uL (ref 4.20–5.80)
RDW: 12 % (ref 11.0–15.0)
WBC: 4.8 10*3/uL (ref 3.8–10.8)

## 2019-06-23 LAB — COMPLETE METABOLIC PANEL WITH GFR
AG Ratio: 1.4 (calc) (ref 1.0–2.5)
ALT: 11 U/L (ref 9–46)
AST: 15 U/L (ref 10–35)
Albumin: 4 g/dL (ref 3.6–5.1)
Alkaline phosphatase (APISO): 87 U/L (ref 35–144)
BUN: 13 mg/dL (ref 7–25)
CO2: 24 mmol/L (ref 20–32)
Calcium: 11.5 mg/dL — ABNORMAL HIGH (ref 8.6–10.3)
Chloride: 110 mmol/L (ref 98–110)
Creat: 1.1 mg/dL (ref 0.70–1.25)
GFR, Est African American: 79 mL/min/{1.73_m2} (ref >=60)
GFR, Est Non African American: 68 mL/min/{1.73_m2} (ref >=60)
Globulin: 2.9 g/dL (calc) (ref 1.9–3.7)
Glucose, Bld: 77 mg/dL (ref 65–99)
Potassium: 4.8 mmol/L (ref 3.5–5.3)
Sodium: 141 mmol/L (ref 135–146)
Total Bilirubin: 0.3 mg/dL (ref 0.2–1.2)
Total Protein: 6.9 g/dL (ref 6.1–8.1)

## 2019-06-23 LAB — HIV-1 RNA QUANT-NO REFLEX-BLD
HIV 1 RNA Quant: <20 copies/mL — AB
HIV-1 RNA Quant, Log: <1.3 Log copies/mL — AB

## 2019-06-23 LAB — RPR: RPR Ser Ql: NONREACTIVE

## 2019-06-23 LAB — LIPID PANEL
Cholesterol: 142 mg/dL (ref <200)
HDL: 32 mg/dL — ABNORMAL LOW (ref >=40)
LDL Cholesterol (Calc): 94 mg/dL (calc)
Non-HDL Cholesterol (Calc): 110 mg/dL (calc) (ref <130)
Total CHOL/HDL Ratio: 4.4 (calc) (ref <5.0)
Triglycerides: 71 mg/dL (ref <150)

## 2019-06-30 ENCOUNTER — Telehealth: Payer: Self-pay

## 2019-06-30 NOTE — Telephone Encounter (Signed)
Called patient per Terri Piedra, FNP with lab results. Patient was able to take my call and review labs. Patient does not have any questions at this time and will follow up with [place] on 10/26. [place]

## 2019-06-30 NOTE — Telephone Encounter (Signed)
-----   Message from Golden Circle, [place] sent at 06/30/2019  3:01 PM EDT ----- Please inform [name] that his CD4 count is 208 and his viral load is undetectable. Continue to take Symtuza and Bactrim. We will work to get him off the Bactrim in his next office visit.

## 2019-07-26 MED FILL — SULFAMETHOXAZOLE-TMP DS TAB: 800-160 | 30 days supply | Qty: 30 | Fill #1

## 2019-07-26 MED FILL — SYMTUZA 800-150-200-10 MG T: 800-150-200 | 30 days supply | Qty: 30 | Fill #2

## 2019-08-19 ENCOUNTER — Other Ambulatory Visit: Payer: Self-pay | Admitting: Family

## 2019-08-19 DIAGNOSIS — B20 Human immunodeficiency virus [HIV] disease: Secondary | ICD-10-CM

## 2019-08-23 MED FILL — SYMTUZA 800-150-200-10 MG T: 800-150-200 | 30 days supply | Qty: 30 | Fill #3

## 2019-08-23 MED FILL — SULFAMETHOXAZOLE-TMP DS TAB: 800-160 | 30 days supply | Qty: 30 | Fill #0

## 2019-09-29 MED FILL — SULFAMETHOXAZOLE-TMP DS TAB: 800-160 | 30 days supply | Qty: 30 | Fill #1

## 2019-09-29 MED FILL — SYMTUZA 800-150-200-10 MG T: 800-150-200 | 30 days supply | Qty: 30 | Fill #0

## 2019-10-25 ENCOUNTER — Other Ambulatory Visit: Payer: Self-pay | Admitting: Family

## 2019-10-25 ENCOUNTER — Ambulatory Visit: Payer: Medicaid Other | Admitting: Family

## 2019-10-25 DIAGNOSIS — B20 Human immunodeficiency virus [HIV] disease: Secondary | ICD-10-CM

## 2019-10-27 MED FILL — SULFAMETHOXAZOLE-TMP DS TAB: 800-160 | 30 days supply | Qty: 30 | Fill #0

## 2019-10-27 MED FILL — SYMTUZA 800-150-200-10 MG T: 800-150-200 | 30 days supply | Qty: 30 | Fill #1

## 2019-11-23 MED FILL — SYMTUZA 800-150-200-10 MG T: 800-150-200 | 30 days supply | Qty: 30 | Fill #2

## 2019-11-23 MED FILL — SULFAMETHOXAZOLE-TMP DS TAB: 800-160 | 30 days supply | Qty: 30 | Fill #1

## 2019-12-16 ENCOUNTER — Other Ambulatory Visit: Payer: Self-pay | Admitting: Family

## 2019-12-16 DIAGNOSIS — B20 Human immunodeficiency virus [HIV] disease: Secondary | ICD-10-CM

## 2019-12-20 MED FILL — SULFAMETHOXAZOLE-TMP DS TAB: 800-160 | 30 days supply | Qty: 30 | Fill #0

## 2019-12-20 MED FILL — SYMTUZA 800-150-200-10 MG T: 800-150-200 | 30 days supply | Qty: 30 | Fill #3

## 2019-12-25 ENCOUNTER — Emergency Department (HOSPITAL_COMMUNITY): Payer: Medicare Other

## 2019-12-25 ENCOUNTER — Encounter (HOSPITAL_COMMUNITY): Payer: Self-pay | Admitting: Emergency Medicine

## 2019-12-25 ENCOUNTER — Emergency Department (HOSPITAL_COMMUNITY)
Admission: EM | Admit: 2019-12-25 | Discharge: 2019-12-25 | Disposition: A | Discharge status: Home / Self Care | Payer: Medicare Other | Attending: Emergency Medicine | Admitting: Emergency Medicine

## 2019-12-25 ENCOUNTER — Other Ambulatory Visit: Payer: Self-pay

## 2019-12-25 DIAGNOSIS — S52531A Colles' fracture of right radius, initial encounter for closed fracture: Secondary | ICD-10-CM

## 2019-12-25 DIAGNOSIS — Y999 Unspecified external cause status: Secondary | ICD-10-CM | POA: Insufficient documentation

## 2019-12-25 DIAGNOSIS — B20 Human immunodeficiency virus [HIV] disease: Secondary | ICD-10-CM | POA: Insufficient documentation

## 2019-12-25 DIAGNOSIS — F17210 Nicotine dependence, cigarettes, uncomplicated: Secondary | ICD-10-CM | POA: Diagnosis not present

## 2019-12-25 DIAGNOSIS — Y939 Activity, unspecified: Secondary | ICD-10-CM | POA: Diagnosis not present

## 2019-12-25 DIAGNOSIS — S52601A Unspecified fracture of lower end of right ulna, initial encounter for closed fracture: Secondary | ICD-10-CM

## 2019-12-25 DIAGNOSIS — Y929 Unspecified place or not applicable: Secondary | ICD-10-CM | POA: Insufficient documentation

## 2019-12-25 DIAGNOSIS — J449 Chronic obstructive pulmonary disease, unspecified: Secondary | ICD-10-CM | POA: Insufficient documentation

## 2019-12-25 DIAGNOSIS — Z79899 Other long term (current) drug therapy: Secondary | ICD-10-CM | POA: Insufficient documentation

## 2019-12-25 DIAGNOSIS — W19XXXA Unspecified fall, initial encounter: Secondary | ICD-10-CM | POA: Diagnosis not present

## 2019-12-25 DIAGNOSIS — S59911A Unspecified injury of right forearm, initial encounter: Secondary | ICD-10-CM | POA: Diagnosis present

## 2019-12-25 MED ORDER — NAPROXEN 500 MG PO TABS: 500.0000 mg | ORAL_TABLET | Freq: Two times a day (BID) | ORAL | 0 refills | Status: DC

## 2019-12-25 MED ORDER — IBUPROFEN 800 MG PO TABS: 800.0000 mg | ORAL_TABLET | Freq: Once | ORAL | Status: DC

## 2019-12-25 NOTE — ED Notes (Signed)
To Rad 

## 2019-12-25 NOTE — ED Triage Notes (Signed)
Patient states he fell Tuesday night and hurt his right wrist. Swelling and slight deformity to right wrist.

## 2019-12-25 NOTE — ED Notes (Signed)
R wrist pain after fall Thurs

## 2019-12-25 NOTE — ED Provider Notes (Signed)
[facility] EMERGENCY DEPARTMENT Provider Note   CSN: [id number] Arrival date & time: 12/25/19  1022     History Chief Complaint  Patient presents with  . [employer] is a 68 y.o. male.  HPI   This patient is a 68 year old male, known history of HIV, states that several days ago he fell landing on his right arm, he has had swelling and deformity of the right wrist since that time.  This is been constant for the last 2 days.  No medications given prior to arrival.  Symptoms are persistent, worse with range of motion and not associated with any numbness or weakness.  Past Medical History:  Diagnosis Date  . HIV (human immunodeficiency virus infection) [facility])     Patient Active Problem List   Diagnosis Date Noted  . Healthcare maintenance 06/17/2019  . Tobacco use 06/17/2019  . Infestation by bed bug 11/02/2018  . Underweight 11/02/2018  . ERECTILE DYSFUNCTION 04/13/2008  . Unspecified glaucoma 08/19/2007  . DENTAL CARIES 08/19/2007  . AIDS (acquired immune deficiency syndrome) ([place]) 10/22/2006  . 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  . PNEUMOCYSTIS PNEUMONIA 10/22/2006  . COPD 10/22/2006  . SEIZURE DISORDER 10/22/2006    History reviewed. No pertinent surgical history.     History reviewed. No pertinent family history.  Social History   Tobacco Use  . Smoking status: Current Every Day Smoker    Packs/day: 1.50    Types: Cigarettes  . Smokeless tobacco: Never Used  Substance Use Topics  . Alcohol use: No  . Drug use: No    Home Medications Prior to Admission medications   Medication Sig Start Date End Date Taking? Authorizing Provider  Darunavir-Cobicisctat-Emtricitabine-Tenofovir Alafenamide (SYMTUZA) 800-150-200-10 MG TABS Take 1 tablet by mouth daily. 06/17/19   Golden Circle, FNP  sulfamethoxazole-trimethoprim (BACTRIM DS) 800-160 MG tablet TAKE 1 TABLET BY MOUTH DAILY. 12/16/19   Golden Circle, FNP    Allergies    Penicillins  Review  of Systems   Review of Systems  Musculoskeletal: Positive for joint swelling.  Neurological: Negative for weakness and numbness.    Physical Exam Updated Vital Signs BP (!) 109/92 (BP Location: Left Arm)   Pulse 97   Temp 98 F (36.7 C) (Oral)   Resp 15   Ht 1.803 m (5\' 11" )   Wt 70.3 kg   SpO2 100%   BMI 21.62 kg/m   Physical Exam Vitals and nursing note reviewed.  Constitutional:      Appearance: He is well-developed. He is not diaphoretic.  HENT:     Head: Normocephalic and atraumatic.  Eyes:     General:        Right eye: No discharge.        Left eye: No discharge.     Conjunctiva/sclera: Conjunctivae normal.  Pulmonary:     Effort: Pulmonary effort is normal. No respiratory distress.  Musculoskeletal:        General: Swelling, tenderness and deformity present.  Skin:    General: Skin is warm and dry.     Findings: No erythema or rash.  Neurological:     Mental Status: He is alert.     Coordination: Coordination normal.     Comments: No numbness of the entire right hand     ED Results / Procedures / Treatments   Labs (all labs ordered are listed, but only abnormal results are displayed) Labs Reviewed - No data to display  EKG None  Radiology DG  Forearm Right  Result Date: 12/25/2019 CLINICAL DATA:  Wrist pain and deformity since falling 4 days ago. EXAM: RIGHT FOREARM - 2 VIEW COMPARISON:  None. FINDINGS: The bones are demineralized. There are fractures of the distal radius and ulna as described on the wrist radiographs. The remainder of the right radius and ulna appear normal. There is no dislocation at the elbow or foreign body. IMPRESSION: Fractures of the distal radius and ulna as described on the wrist radiographs. No significant findings in the proximal forearm. Electronically Signed   By: Richardean Sale M.D.   On: 12/25/2019 12:43   DG Wrist Complete Right  Result Date: 12/25/2019 CLINICAL DATA:  Wrist pain and deformity since falling 4 days  ago. EXAM: RIGHT WRIST - COMPLETE 3+ VIEW COMPARISON:  None. FINDINGS: The bones are demineralized. There is a comminuted and posteriorly displaced intra-articular fracture of the distal radius. This involves the distal articular surface and is associated with apex anterior angulation. There is a mildly displaced ulnar styloid fracture. No evidence of carpal bone fracture or dislocation. Moderate soft tissue swelling around the wrist. IMPRESSION: 1. Comminuted and displaced intra-articular fracture of the distal radius with apex anterior angulation. 2. Mildly displaced ulnar styloid fracture. Electronically Signed   By: Richardean Sale M.D.   On: 12/25/2019 12:42   DG Hand Complete Right  Result Date: 12/25/2019 CLINICAL DATA:  Status post fall with right hand pain. EXAM: RIGHT HAND - COMPLETE 3+ VIEW COMPARISON:  None. FINDINGS: Comminuted displaced fractures of distal ulna and radius are identified. IMPRESSION: Comminuted displaced fractures of distal ulna and radius are identified. Electronically Signed   By: Abelardo Diesel M.D.   On: 12/25/2019 12:38    Procedures Procedures (including critical care time)  Medications Ordered in ED Medications - No data to display  ED Course  I have reviewed the triage vital signs and the nursing notes.  Pertinent labs & imaging results that were available during my care of the patient were reviewed by me and considered in my medical decision making (see chart for details).    MDM Rules/Calculators/A&P                      There is deformity of the right wrist which appears consistent with having a Colles' or distal radial fracture.  Imaging has been pending, there is normal pulses, normal capillary refill, normal sensation to the radial median and ulnar nerve distributions.  He will likely need to be immobilized, discussion with orthopedic or hand surgery.  The patient otherwise has no injuries.  He has been able to move his right elbow without any  difficulty at all other joints are normal except for the right wrist.  I discussed the case with Dr. Aline Brochure of the orthopedic service, he is willing to see the patient in follow-up, recommend sugar tong, sling and follow-up.  Patient agreeable, nurse placed sling and splint, neurovascular status intact after placement on my exam.  Final Clinical Impression(s) / ED Diagnoses Final diagnoses:  Closed Colles' fracture of right radius, initial encounter  Closed fracture of distal end of right ulna, unspecified fracture morphology, initial encounter    Rx / DC Orders ED Discharge Orders         Ordered    naproxen (NAPROSYN) 500 MG tablet  2 times daily with meals     12/25/19 1359           Noemi Chapel, MD 12/26/19 0700

## 2019-12-25 NOTE — Discharge Instructions (Signed)
You have a broken wrist.  This will need to be surgically repaired by Dr. Aline Brochure.  Please see his phone number above  Please keep your arm in the splint, call the office on Monday morning to follow-up this week with him.  Keep your arm elevated, apply ice and take naproxen twice daily.  Seek medical exam for severe or worsening pain, swelling, numbness or weakness of the hand or arm or fingers.

## 2019-12-25 NOTE — ED Notes (Signed)
Cast application by RN and Josph Macho

## 2019-12-27 ENCOUNTER — Encounter: Payer: Self-pay | Admitting: Orthopedic Surgery

## 2019-12-27 ENCOUNTER — Other Ambulatory Visit: Payer: Self-pay

## 2019-12-27 ENCOUNTER — Ambulatory Visit (INDEPENDENT_AMBULATORY_CARE_PROVIDER_SITE_OTHER): Payer: Medicare Other | Admitting: Orthopedic Surgery

## 2019-12-27 VITALS — BP 118/71 | HR 102 | Ht 71.0 in | Wt 150.0 lb

## 2019-12-27 DIAGNOSIS — S52531A Colles' fracture of right radius, initial encounter for closed fracture: Secondary | ICD-10-CM
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
## 2019-12-27 NOTE — Patient Instructions (Signed)
Cast or Splint Care, Adult  Casts and splints are supports that are worn to protect broken bones and other injuries. A cast or splint may hold a bone still and in the correct position while it heals. Casts and splints may also help to ease pain, swelling, and muscle spasms.  How to care for your cast    · Do not stick anything inside the cast to scratch your skin.  · Check the skin around the cast every day. Tell your doctor about any concerns.  · You may put lotion on dry skin around the edges of the cast. Do not put lotion on the skin under the cast.  · Keep the cast clean.  · If the cast is not waterproof:  ? Do not let it get wet.  ? Cover it with a watertight covering when you take a bath or a shower.  How to care for your splint    · Wear it as told by your doctor. Take it off only as told by your doctor.  · Loosen the splint if your fingers or toes tingle, get numb, or turn cold and blue.  · Keep the splint clean.  · If the splint is not waterproof:  ? Do not let it get wet.  ? Cover it with a watertight covering when you take a bath or a shower.  Follow these instructions at home:  Bathing  · Do not take baths or swim until your doctor says it is okay. Ask your doctor if you can take showers. You may only be allowed to take sponge baths for bathing.  · If your cast or splint is not waterproof, cover it with a watertight covering when you take a bath or shower.  Managing pain, stiffness, and swelling  · Move your fingers or toes often to avoid stiffness and to lessen swelling.  · Raise (elevate) the injured area above the level of your heart while sitting or lying down.  Safety  · Do not use the injured limb to support your body weight until your doctor says that it is okay.  · Use crutches or other assistive devices as told by your doctor.  General instructions  · Do not put pressure on any part of the cast or splint until it is fully hardened. This may take many hours.  · Return to your normal activities as  told by your doctor. Ask your doctor what activities are safe for you.  · Keep all follow-up visits as told by your doctor. This is important.  Contact a doctor if:  · Your cast or splint gets damaged.  · The skin around the cast gets red or raw.  · The skin under the cast is very itchy or painful.  · Your cast or splint feels very uncomfortable.  · Your cast or splint is too tight or too loose.  · Your cast becomes wet or it starts to have a soft spot or area.  · You get an object stuck under your cast.  Get help right away if:  · Your pain gets worse.  · The injured area tingles, gets numb, or turns blue and cold.  · The part of your body above or below the cast is swollen and it turns a different color (is discolored).  · You cannot feel or move your fingers or toes.  · There is fluid leaking through the cast.  · You have very bad pain or pressure under the cast.  ·   04/17/2011 Document Revised: 04/07/2019 Document Reviewed: 12/06/2016 Elsevier Patient Education  [place].

## 2020-01-17 ENCOUNTER — Other Ambulatory Visit: Payer: Self-pay | Admitting: Orthopedic Surgery

## 2020-01-17 NOTE — Telephone Encounter (Signed)
Patient called requesting prescription for pain. Michela Pitcher it was Dr Aline Brochure who prescribed.  Please advise. States uses [facility], [place]

## 2020-01-18 ENCOUNTER — Other Ambulatory Visit: Payer: Self-pay | Admitting: Orthopedic Surgery

## 2020-01-18 ENCOUNTER — Telehealth: Payer: Self-pay | Admitting: Orthopedic Surgery

## 2020-01-18 MED ORDER — NAPROXEN 500 MG PO TABS: 500.0000 mg | ORAL_TABLET | Freq: Two times a day (BID) | ORAL | 2 refills | Status: DC

## 2020-01-18 NOTE — Telephone Encounter (Signed)
Patient called back regarding medication refill - said was ordered at incorrect pharmacy- as per note 01/17/20, patient states his pharmacy is [facility], [address], [place]  Disp Refills Start End   naproxen (NAPROSYN) 500 MG tablet 60 tablet

## 2020-01-19 ENCOUNTER — Telehealth: Payer: Self-pay

## 2020-01-19 MED ORDER — NAPROXEN 500 MG PO TABS: 500.0000 mg | ORAL_TABLET | Freq: Two times a day (BID) | ORAL | 2 refills | Status: DC

## 2020-01-19 NOTE — Telephone Encounter (Signed)
Resent

## 2020-01-19 NOTE — Telephone Encounter (Signed)
Dr. Aline Brochure did this prescription yesterday but it shows print instead of it being received by pharmacy.   Naproxen 500 MG Qty 60 Tablets  Take 1 tablet by mouth (500 mg total)2(two) times daily with a meal.  PATIENT USES [facility] ON [address]

## 2020-01-20 DIAGNOSIS — S52531A Colles' fracture of right radius, initial encounter for closed fracture: Secondary | ICD-10-CM | POA: Insufficient documentation

## 2020-01-21 ENCOUNTER — Other Ambulatory Visit: Payer: Self-pay | Admitting: Pharmacist

## 2020-01-21 DIAGNOSIS — B20 Human immunodeficiency virus [HIV] disease: Secondary | ICD-10-CM

## 2020-01-21 MED ORDER — SYMTUZA 800-150-200-10 MG PO TABS: 1.0000 | ORAL_TABLET | Freq: Every day | ORAL | 1 refills | Status: DC

## 2020-01-21 MED ORDER — SULFAMETHOXAZOLE-TRIMETHOPRIM 800-160 MG PO TABS: 1.0000 | ORAL_TABLET | Freq: Every day | ORAL | 1 refills | Status: DC

## 2020-01-21 MED FILL — SULFAMETHOXAZOLE-TMP DS TAB: 800-160 | 30 days supply | Qty: 30 | Fill #0

## 2020-01-24 ENCOUNTER — Ambulatory Visit (INDEPENDENT_AMBULATORY_CARE_PROVIDER_SITE_OTHER): Payer: Medicare HMO | Admitting: Orthopedic Surgery

## 2020-01-24 ENCOUNTER — Encounter: Payer: Self-pay | Admitting: Orthopedic Surgery

## 2020-01-24 ENCOUNTER — Other Ambulatory Visit: Payer: Self-pay

## 2020-01-24 ENCOUNTER — Ambulatory Visit: Payer: Medicare HMO

## 2020-01-24 DIAGNOSIS — S52531D Colles' fracture of right radius, subsequent encounter for closed fracture with routine healing: Secondary | ICD-10-CM

## 2020-01-24 NOTE — Progress Notes (Signed)
[name], male   DOB: Dec 07, 1951, 69 y.o.   MRN: [id number]  FRACTURE CARE   Chief Complaint  Patient presents with  . Wrist Injury    12/21/19 right wrist fracture     Encounter Diagnosis  Name Primary?  . Closed Colles' fracture of right radius with routine healing, subsequent encounter 12/23/2019 Yes    CURRENT TREATMENT : Cast  POST INJURY DAY: 30 days  GLOBAL PERIOD DAY 28/90  Surgical treatment was decided against we excited to accept the deformity.  The fracture is not really changed position.  X-ray shows intra-articular fracture at the lunate fossa with shortening and some dorsal angulation  Clinical exam the deformity is notable he has mild tenderness at the fracture site he is moving all his fingers  We placed him in a removable brace that he will wear full-time except for bathing  Come back in 4 weeks for x-ray again  Encounter Diagnosis  Name Primary?  . Closed Colles' fracture of right radius with routine healing, subsequent encounter 12/23/2019 Yes

## 2020-01-24 NOTE — Patient Instructions (Signed)
The fracture is healing you will wear a brace for 4 weeks and then come back for x-ray again

## 2020-01-25 ENCOUNTER — Telehealth: Payer: Self-pay | Admitting: Pharmacy Technician

## 2020-01-25 MED FILL — SYMTUZA 800-150-200-10 MG T: 800-150-200 | 30 days supply | Qty: 30 | Fill #0

## 2020-01-25 NOTE — Telephone Encounter (Signed)
[facility] Patient Advocate Encounter   I was successful in securing patient a $7500 grant from [employer] (PAF) to provide copayment coverage for Symtuza.  This will make the out of pocket cost $0.     I have spoken with the patient.    The billing information is as follows and has been shared with [place].   Award Period: - 01/24/2021 Cardholder: [id number] BIN: [id number] PCN: [id number] Group: [id number]  Patient knows to call the office with questions or concerns.  Venida Jarvis. Nadara Mustard [place] Patient [facility] for Infectious Disease Phone: [phone] Fax:  [phone]

## 2020-02-14 ENCOUNTER — Ambulatory Visit: Payer: Medicare HMO | Admitting: Internal Medicine

## 2020-02-21 ENCOUNTER — Ambulatory Visit: Payer: Medicare HMO

## 2020-02-21 ENCOUNTER — Other Ambulatory Visit: Payer: Self-pay

## 2020-02-21 ENCOUNTER — Encounter: Payer: Self-pay | Admitting: Orthopedic Surgery

## 2020-02-21 ENCOUNTER — Ambulatory Visit (INDEPENDENT_AMBULATORY_CARE_PROVIDER_SITE_OTHER): Payer: Medicare HMO | Admitting: Orthopedic Surgery

## 2020-02-21 DIAGNOSIS — S52531D Colles' fracture of right radius, subsequent encounter for closed fracture with routine healing: Secondary | ICD-10-CM

## 2020-02-21 NOTE — Progress Notes (Signed)
[name], male   DOB: 09-10-1951, 69 y.o.   MRN: [id number]  FRACTURE CARE   Chief Complaint  Patient presents with  . Wrist Injury    12/23/19 right wrist fracture     Encounter Diagnosis  Name Primary?  . Closed Colles' fracture of right radius with routine healing, subsequent encounter 12/23/2019 Yes    FOV: December 27, 2019  CURRENT TREATMENT : Splint    GLOBAL PERIOD started December 28  X-ray today shows the fracture is comminuted healed with some deformity with dorsal tilt and shortening  Clinical exam is benign  Patient can wean himself out of the splint follow-up as needed released today

## 2020-02-24 MED FILL — SULFAMETHOXAZOLE-TMP DS TAB: 800-160 | 30 days supply | Qty: 30 | Fill #1

## 2020-02-24 MED FILL — SYMTUZA 800-150-200-10 MG T: 800-150-200 | 30 days supply | Qty: 30 | Fill #1

## 2020-03-11 DIAGNOSIS — G8929 Other chronic pain: Secondary | ICD-10-CM | POA: Diagnosis not present

## 2020-03-11 DIAGNOSIS — G3184 Mild cognitive impairment, so stated: Secondary | ICD-10-CM | POA: Diagnosis not present

## 2020-03-11 DIAGNOSIS — R69 Illness, unspecified: Secondary | ICD-10-CM | POA: Diagnosis not present

## 2020-03-11 DIAGNOSIS — Z720 Tobacco use: Secondary | ICD-10-CM | POA: Diagnosis not present

## 2020-03-11 DIAGNOSIS — M2550 Pain in unspecified joint: Secondary | ICD-10-CM | POA: Diagnosis not present

## 2020-03-11 DIAGNOSIS — H269 Unspecified cataract: Secondary | ICD-10-CM | POA: Diagnosis not present

## 2020-03-11 DIAGNOSIS — Z79899 Other long term (current) drug therapy: Secondary | ICD-10-CM | POA: Diagnosis not present

## 2020-03-11 DIAGNOSIS — Z791 Long term (current) use of non-steroidal anti-inflammatories (NSAID): Secondary | ICD-10-CM | POA: Diagnosis not present

## 2020-03-16 ENCOUNTER — Other Ambulatory Visit: Payer: Self-pay | Admitting: Pharmacist

## 2020-03-16 DIAGNOSIS — B20 Human immunodeficiency virus [HIV] disease: Secondary | ICD-10-CM

## 2020-03-20 ENCOUNTER — Other Ambulatory Visit: Payer: Self-pay | Admitting: Pharmacist

## 2020-03-20 DIAGNOSIS — B20 Human immunodeficiency virus [HIV] disease: Secondary | ICD-10-CM

## 2020-03-20 MED FILL — SYMTUZA 800-150-200-10 MG T: 800-150-200 | 30 days supply | Qty: 30 | Fill #4

## 2020-04-17 ENCOUNTER — Ambulatory Visit: Payer: Medicare HMO | Admitting: Internal Medicine

## 2020-04-27 ENCOUNTER — Other Ambulatory Visit: Payer: Self-pay | Admitting: Pharmacist

## 2020-04-27 DIAGNOSIS — B20 Human immunodeficiency virus [HIV] disease: Secondary | ICD-10-CM

## 2020-04-27 MED FILL — SYMTUZA 800-150-200-10 MG T: 800-150-200 | 30 days supply | Qty: 30 | Fill #5

## 2020-04-27 MED FILL — SULFAMETHOXAZOLE-TMP DS TAB: 800-160 | 30 days supply | Qty: 30 | Fill #0

## 2020-05-04 ENCOUNTER — Other Ambulatory Visit: Payer: Medicare Other

## 2020-05-17 ENCOUNTER — Other Ambulatory Visit: Payer: Self-pay

## 2020-05-17 ENCOUNTER — Other Ambulatory Visit: Payer: Self-pay | Admitting: Family

## 2020-05-17 ENCOUNTER — Other Ambulatory Visit: Payer: Self-pay | Admitting: Pharmacist

## 2020-05-17 DIAGNOSIS — B20 Human immunodeficiency virus [HIV] disease: Secondary | ICD-10-CM

## 2020-05-17 MED ORDER — SULFAMETHOXAZOLE-TRIMETHOPRIM 800-160 MG PO TABS: ORAL_TABLET | ORAL | 0 refills | Status: DC

## 2020-05-18 ENCOUNTER — Ambulatory Visit: Payer: Medicare Other | Admitting: Family

## 2020-05-22 ENCOUNTER — Telehealth: Payer: Self-pay

## 2020-05-22 MED FILL — SULFAMETHOXAZOLE-TMP DS TAB: 800-160 | 30 days supply | Qty: 30 | Fill #0

## 2020-05-22 MED FILL — SYMTUZA 800-150-200-10 MG T: 800-150-200 | 30 days supply | Qty: 30 | Fill #0

## 2020-05-22 NOTE — Telephone Encounter (Signed)
COVID-19 Pre-Screening Questions:05/22/20  Do you currently have a fever (>100 F), chills or unexplained body aches?NO  Are you currently experiencing new cough, shortness of breath, sore throat, runny nose? NO .  Have you recently travelled outside the state of [place] in the last 14 days? NO .  Have you been in contact with someone that is currently pending confirmation of Covid19 testing or has been confirmed to have the [place] virus? NO  **If the patient answers NO to ALL questions -  advise the patient to please call the clinic before coming to the office should any symptoms develop.

## 2020-05-23 ENCOUNTER — Other Ambulatory Visit: Payer: Self-pay

## 2020-05-23 ENCOUNTER — Ambulatory Visit (INDEPENDENT_AMBULATORY_CARE_PROVIDER_SITE_OTHER): Payer: Medicare Other | Admitting: Family

## 2020-05-23 ENCOUNTER — Encounter: Payer: Self-pay | Admitting: Family

## 2020-05-23 VITALS — BP 120/80 | HR 88 | Wt 155.0 lb

## 2020-05-23 DIAGNOSIS — Z720 Tobacco use: Secondary | ICD-10-CM | POA: Diagnosis not present

## 2020-05-23 DIAGNOSIS — B20 Human immunodeficiency virus [HIV] disease: Secondary | ICD-10-CM

## 2020-05-23 DIAGNOSIS — Z0000 Encounter for general adult medical examination without abnormal findings: Secondary | ICD-10-CM

## 2020-05-23 DIAGNOSIS — Z113 Encounter for screening for infections with a predominantly sexual mode of transmission: Secondary | ICD-10-CM

## 2020-05-23 DIAGNOSIS — Z23 Encounter for immunization: Secondary | ICD-10-CM | POA: Diagnosis not present

## 2020-05-23 MED ORDER — SYMTUZA 800-150-200-10 MG PO TABS: 1.0000 | ORAL_TABLET | Freq: Every day | ORAL | 0 refills | Status: DC

## 2020-05-23 NOTE — Patient Instructions (Signed)
Nice to see you.  We will continue with your Symtuza daily.  STOP taking Bactrim.   We will check your lab work today.  Recommend COVID vaccination.  Plan for follow up in 4 months or sooner if needed with lab work on the same day.  Have a great day and stay safe!

## 2020-05-23 NOTE — Assessment & Plan Note (Signed)
[name] continues to smoke tobacco at a rate of approximately 1 pack/day.  Discussed importance of tobacco cessation to reduce risk of cardiovascular, respiratory, malignant disease in the future.  He is in the precontemplation stage of quitting with no intentions at the present time.  Continue to monitor.

## 2020-05-23 NOTE — Progress Notes (Signed)
Subjective:    [name], male    DOB: 1951/11/20, 69 y.o.   MRN: [id number]  Chief Complaint  Patient presents with  . Follow-up    no complaints     HPI:  [name] is a 69 y.o. male with HIV disease who was last seen in the office on 06/14/19 with well controlled HIV disease and good adherence and tolerance to his ART regimen of Symtuza. Most recent lab work completed on 06/17/2019 with a viral load that was undetectable and CD4 count of 208.  [name] continues to take his Symtuza and Bactrim as prescribed with no adverse side effects or missed doses since his last office visit.  Overall feeling well today with no new concerns/complaints. Denies fevers, chills, night sweats, headaches, changes in vision, neck pain/stiffness, nausea, diarrhea, vomiting, lesions or rashes.  [name] has no problems obtaining his medication from the pharmacy and remains covered through [employer].  Denies feelings of being down, depressed, or hopeless recently.  No recreational or illicit drug use or alcohol consumption.  He does continue to smoke approximately 1 pack of cigarettes per day on average.  He is exercising on a regular basis walking throughout his neighborhood.  He has no problems obtaining food and is eating well with stable and adequate housing.   Allergies  Allergen Reactions  . Penicillins Hives    Has patient had a PCN reaction causing immediate rash, facial/tongue/throat swelling, SOB or lightheadedness with hypotension: No Has patient had a PCN reaction causing severe rash involving mucus membranes or skin necrosis: No Has patient had a PCN reaction that required hospitalization: No Has patient had a PCN reaction occurring within the last 10 years: No If all of the above answers are "NO", then may proceed with Cephalosporin use.       Outpatient Medications Prior to Visit  Medication Sig Dispense Refill  .  sulfamethoxazole-trimethoprim (BACTRIM DS) 800-160 MG tablet TAKE 1 TABLET BY MOUTH ONCE DAILY 30 tablet 0  . SYMTUZA 800-150-200-10 MG TABS TAKE 1 TABLET BY MOUTH DAILY. 30 tablet 0  . naproxen (NAPROSYN) 500 MG tablet Take 1 tablet (500 mg total) by mouth 2 (two) times daily with a meal. 60 tablet 2   No facility-administered medications prior to visit.     Past Medical History:  Diagnosis Date  . HIV (human immunodeficiency virus infection) ([place])      Past Surgical History:  Procedure Laterality Date  . HAND SURGERY      Review of Systems  Constitutional: Negative for appetite change, chills, fatigue, fever and unexpected weight change.  Eyes: Negative for visual disturbance.  Respiratory: Negative for cough, chest tightness, shortness of breath and wheezing.   Cardiovascular: Negative for chest pain and leg swelling.  Gastrointestinal: Negative for abdominal pain, constipation, diarrhea, nausea and vomiting.  Genitourinary: Negative for dysuria, flank pain, frequency, genital sores, hematuria and urgency.  Skin: Negative for rash.  Allergic/Immunologic: Negative for immunocompromised state.  Neurological: Negative for dizziness and headaches.      Objective:    BP 120/80   Pulse 88   Wt 155 lb (70.3 kg)   BMI 21.62 kg/m  Nursing note and vital signs reviewed.  Physical Exam Constitutional:      General: He is not in acute distress.    Appearance: He is well-developed.  Cardiovascular:     Rate and Rhythm: Normal rate and regular rhythm.     Heart sounds: Normal  heart sounds.  Pulmonary:     Effort: Pulmonary effort is normal.     Breath sounds: Normal breath sounds.  Skin:    General: Skin is warm and dry.  Neurological:     Mental Status: He is alert and oriented to person, place, and time.  Psychiatric:        Behavior: Behavior normal.        Thought Content: Thought content normal.        Judgment: Judgment normal.      Depression screen [facility] 2/9  05/23/2020 06/17/2019 06/02/2018 01/30/2018 12/05/2015  Decreased Interest 0 0 0 0 0  Down, Depressed, Hopeless 0 0 0 0 0  PHQ - 2 Score 0 0 0 0 0       Assessment & Plan:    Patient Active Problem List   Diagnosis Date Noted  . Fracture, Colles, right, closed 01/20/2020  . Healthcare maintenance 06/17/2019  . Tobacco use 06/17/2019  . Infestation by bed bug 11/02/2018  . Underweight 11/02/2018  . ERECTILE DYSFUNCTION 04/13/2008  . Unspecified glaucoma 08/19/2007  . DENTAL CARIES 08/19/2007  . AIDS (acquired immune deficiency syndrome) ([place]) 10/22/2006  . 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  . PNEUMOCYSTIS PNEUMONIA 10/22/2006  . COPD 10/22/2006  . SEIZURE DISORDER 10/22/2006     Problem List Items Addressed This Visit      Other   AIDS (acquired immune deficiency syndrome) ([place])   Relevant Medications   Darunavir-Cobicisctat-Emtricitabine-Tenofovir Alafenamide (SYMTUZA) 800-150-200-10 MG TABS   Other Relevant Orders   COMPLETE METABOLIC PANEL WITH GFR   HIV-1 RNA quant-no reflex-bld   T-helper cell (CD4)- ([facility] clinic only)    Other Visit Diagnoses    Screening for STDs (sexually transmitted diseases)    -  Primary   Relevant Orders   RPR       I have discontinued Mallie Mussel E. Mansel's sulfamethoxazole-trimethoprim. I have also changed his Symtuza. Additionally, I am having him maintain his naproxen.   Meds ordered this encounter  Medications  . Darunavir-Cobicisctat-Emtricitabine-Tenofovir Alafenamide (SYMTUZA) 800-150-200-10 MG TABS    Sig: Take 1 tablet by mouth daily.    Dispense:  30 tablet    Refill:  0    Order Specific Question:   Supervising Provider    Answer:   Carlyle Basques [[id number]]     Follow-up: Return in about 4 months (around 09/23/2020), or if symptoms worsen or fail to improve.   Terri Piedra, MSN, FNP-C Nurse Practitioner [facility] for Infectious Disease [place] number: [phone]

## 2020-05-23 NOTE — Assessment & Plan Note (Signed)
Mr. Rutkowski appears to have well-controlled HIV disease with good adherence and tolerance to his ART regimen of Symtuza.  No signs/symptoms of opportunistic infection or progressive HIV disease.  Reviewed his previous lab work and discussed the plan of care.  Check blood work today.  Continue current dose of Symtuza.  Plan for follow-up in 4 months or sooner if needed with lab work on the same day.

## 2020-05-23 NOTE — Assessment & Plan Note (Signed)
   Prevnar updated today.  Discussed possible Covid vaccine in 3 weeks with recommendations to receive.  Discussed importance of safe sexual practice to reduce risk of STI.  Condoms declined

## 2020-05-24 LAB — T-HELPER CELL (CD4) - (RCID CLINIC ONLY)
CD4 % Helper T Cell: 13 % — ABNORMAL LOW (ref 33–65)
CD4 T Cell Abs: 268 /uL — ABNORMAL LOW (ref 400–1790)

## 2020-05-25 LAB — COMPLETE METABOLIC PANEL WITH GFR
AG Ratio: 1.5 (calc) (ref 1.0–2.5)
ALT: 15 U/L (ref 9–46)
AST: 14 U/L (ref 10–35)
Albumin: 4.1 g/dL (ref 3.6–5.1)
Alkaline phosphatase (APISO): 121 U/L (ref 35–144)
BUN/Creatinine Ratio: 12 (calc) (ref 6–22)
BUN: 18 mg/dL (ref 7–25)
CO2: 23 mmol/L (ref 20–32)
Calcium: 11.5 mg/dL — ABNORMAL HIGH (ref 8.6–10.3)
Chloride: 110 mmol/L (ref 98–110)
Creat: 1.53 mg/dL — ABNORMAL HIGH (ref 0.70–1.18)
GFR, Est African American: 53 mL/min/{1.73_m2} — ABNORMAL LOW (ref >=60)
GFR, Est Non African American: 45 mL/min/{1.73_m2} — ABNORMAL LOW (ref >=60)
Globulin: 2.7 g/dL (calc) (ref 1.9–3.7)
Glucose, Bld: 82 mg/dL (ref 65–99)
Potassium: 4.5 mmol/L (ref 3.5–5.3)
Sodium: 140 mmol/L (ref 135–146)
Total Bilirubin: 0.3 mg/dL (ref 0.2–1.2)
Total Protein: 6.8 g/dL (ref 6.1–8.1)

## 2020-05-25 LAB — HIV-1 RNA QUANT-NO REFLEX-BLD
HIV 1 RNA Quant: <20 copies/mL — AB
HIV-1 RNA Quant, Log: <1.3 Log copies/mL — AB

## 2020-05-25 LAB — RPR: RPR Ser Ql: NONREACTIVE

## 2020-07-04 MED FILL — SYMTUZA 800-150-200-10 MG T: 800-150-200 | 30 days supply | Qty: 30 | Fill #0

## 2020-07-07 ENCOUNTER — Telehealth: Payer: Self-pay

## 2020-07-07 NOTE — Telephone Encounter (Signed)
Patient called office today stating he received the wrong medication. States he received a brown pill and is needing the white pill instead.  Called [facility] to confirm patient medication. Pharmacist states that Symtuza was the only prescription sent.  Updated patient and advise he take Symtuza (brown pill) [name], [place]

## 2020-08-22 ENCOUNTER — Other Ambulatory Visit: Payer: Self-pay

## 2020-08-22 DIAGNOSIS — B20 Human immunodeficiency virus [HIV] disease: Secondary | ICD-10-CM

## 2020-08-22 MED ORDER — SYMTUZA 800-150-200-10 MG PO TABS: 1.0000 | ORAL_TABLET | Freq: Every day | ORAL | 0 refills | Status: DC

## 2020-09-06 MED FILL — SYMTUZA 800-150-200-10 MG T: 800-150-200 | 30 days supply | Qty: 30 | Fill #0

## 2020-09-11 ENCOUNTER — Ambulatory Visit: Payer: Medicare Other | Admitting: Family

## 2020-10-02 ENCOUNTER — Other Ambulatory Visit (HOSPITAL_COMMUNITY): Payer: Self-pay | Admitting: Family

## 2020-10-02 ENCOUNTER — Other Ambulatory Visit: Payer: Self-pay | Admitting: Family

## 2020-10-02 DIAGNOSIS — B20 Human immunodeficiency virus [HIV] disease: Secondary | ICD-10-CM

## 2020-10-04 MED FILL — SYMTUZA 800-150-200-10 MG T: 800-150-200 | 30 days supply | Qty: 30 | Fill #0

## 2020-11-07 ENCOUNTER — Ambulatory Visit (INDEPENDENT_AMBULATORY_CARE_PROVIDER_SITE_OTHER): Payer: Medicare Other | Admitting: Family

## 2020-11-07 ENCOUNTER — Encounter: Payer: Self-pay | Admitting: Family

## 2020-11-07 ENCOUNTER — Other Ambulatory Visit (HOSPITAL_COMMUNITY): Payer: Self-pay | Admitting: Family

## 2020-11-07 ENCOUNTER — Telehealth: Payer: Self-pay

## 2020-11-07 ENCOUNTER — Other Ambulatory Visit: Payer: Self-pay

## 2020-11-07 VITALS — BP 147/92 | HR 93 | Temp 98.1°F | Wt 146.0 lb

## 2020-11-07 DIAGNOSIS — Z0000 Encounter for general adult medical examination without abnormal findings: Secondary | ICD-10-CM

## 2020-11-07 DIAGNOSIS — Z79899 Other long term (current) drug therapy: Secondary | ICD-10-CM | POA: Diagnosis not present

## 2020-11-07 DIAGNOSIS — Z113 Encounter for screening for infections with a predominantly sexual mode of transmission: Secondary | ICD-10-CM | POA: Diagnosis not present

## 2020-11-07 DIAGNOSIS — B20 Human immunodeficiency virus [HIV] disease: Secondary | ICD-10-CM | POA: Diagnosis not present

## 2020-11-07 DIAGNOSIS — Z720 Tobacco use: Secondary | ICD-10-CM

## 2020-11-07 DIAGNOSIS — R636 Underweight: Secondary | ICD-10-CM

## 2020-11-07 MED ORDER — SYMTUZA 800-150-200-10 MG PO TABS: 1.0000 | ORAL_TABLET | Freq: Every day | ORAL | 5 refills | Status: DC

## 2020-11-07 NOTE — Telephone Encounter (Signed)
Attempted to call patient with information for Opthalmology office that accepts insurance. Per referral Coordinator patient can be seen at [facility]. No referral is needed.  Margot Ables Phone: [phone] Address: [address], [place], [place] [address]

## 2020-11-07 NOTE — Assessment & Plan Note (Signed)
Mr. Wynn continues to smoke tobacco at a rate of approximately 1 pack/day.  Discussed importance of tobacco cessation to reduce risk of cardiovascular, respiratory, malignant disease in the future.  Is in the precontemplation stage of quitting and is not ready to quit.  Continue to monitor.

## 2020-11-07 NOTE — Patient Instructions (Addendum)
Nice to see you.  We will check your blood work today.  Continue to take your Symtuza daily as prescribed.  Refills have been sent to the pharmacy.  Recommend following up with Medicaid to find eye doctor - may consider [facility].   Recommend flu vaccine as well as Covid vaccine.  Plan for follow-up in 4 months or sooner if needed with lab work on the same day.  Have a great day and stay safe!

## 2020-11-07 NOTE — Assessment & Plan Note (Signed)
   Discussed/recommended Covid vaccine.  Declines today.  Discussed importance of safe sexual practice to reduce risk of STI.  Condoms provided.  Due for routine dental care which she will check on.

## 2020-11-07 NOTE — Progress Notes (Signed)
Subjective:    [name], male    DOB: Jan 05, 1951, 69 y.o.   MRN: [id number]  Chief Complaint  Patient presents with  . Follow-up     HPI:  [name] is a 69 y.o. male with HIV disease who was last seen in the office on 05/23/2020 with good adherence and tolerance to his ART regimen of Symtuza.  Viral load at the time was undetectable with CD4 count of 268.  RPR was nonreactive for syphilis.  No recent blood work completed.  Here today for routine follow-up.  [name] continues to take his Symtuza daily as prescribed with no adverse side effects or missed doses since his last office visit.  Overall feeling well today although is concerned about changes in his vision recently.  He is interested in seeing an eye doctor.  Denies any trauma or injury.  Primarily located on the right side. Denies fevers, chills, night sweats, headaches, neck pain/stiffness, nausea, diarrhea, vomiting, lesions or rashes.  [name] has no problems obtaining his medications from the pharmacy and remains covered [place] healthcare Medicare.  Denies any feelings of being down, depressed, or hopeless recently.  He is currently with a significant other.  Condoms provided per request.  No recreational or illicit drug use or alcohol consumption.  He continues to smoke about 1 pack of cigarettes per day on average.  He is interested in receiving the flu vaccine denies Covid vaccine.   Allergies  Allergen Reactions  . Penicillins Hives    Has patient had a PCN reaction causing immediate rash, facial/tongue/throat swelling, SOB or lightheadedness with hypotension: No Has patient had a PCN reaction causing severe rash involving mucus membranes or skin necrosis: No Has patient had a PCN reaction that required hospitalization: No Has patient had a PCN reaction occurring within the last 10 years: No If all of the above answers are "NO", then may proceed with Cephalosporin use.        Outpatient Medications Prior to Visit  Medication Sig Dispense Refill  . SYMTUZA 800-150-200-10 MG TABS TAKE 1 TABLET BY MOUTH DAILY. 30 tablet 0  . naproxen (NAPROSYN) 500 MG tablet Take 1 tablet (500 mg total) by mouth 2 (two) times daily with a meal. (Patient not taking: Reported on 11/07/2020) 60 tablet 2   No facility-administered medications prior to visit.     Past Medical History:  Diagnosis Date  . HIV (human immunodeficiency virus infection) ([place])      Past Surgical History:  Procedure Laterality Date  . HAND SURGERY         Review of Systems  Constitutional: Negative for appetite change, chills, fatigue, fever and unexpected weight change.  Eyes: Negative for visual disturbance.  Respiratory: Negative for cough, chest tightness, shortness of breath and wheezing.   Cardiovascular: Negative for chest pain and leg swelling.  Gastrointestinal: Negative for abdominal pain, constipation, diarrhea, nausea and vomiting.  Genitourinary: Negative for dysuria, flank pain, frequency, genital sores, hematuria and urgency.  Skin: Negative for rash.  Allergic/Immunologic: Negative for immunocompromised state.  Neurological: Negative for dizziness and headaches.      Objective:    BP (!) 147/92   Pulse 93   Temp 98.1 F (36.7 C) (Oral)   Wt 146 lb (66.2 kg)   BMI 20.36 kg/m  Nursing note and vital signs reviewed.  Physical Exam Constitutional:      General: He is not in acute distress.    Appearance: He is well-developed.  Eyes:     Conjunctiva/sclera: Conjunctivae normal.  Cardiovascular:     Rate and Rhythm: Normal rate and regular rhythm.     Heart sounds: Normal heart sounds. No murmur heard.  No friction rub. No gallop.   Pulmonary:     Effort: Pulmonary effort is normal. No respiratory distress.     Breath sounds: Normal breath sounds. No wheezing or rales.  Chest:     Chest wall: No tenderness.  Abdominal:     General: Bowel sounds are normal.      Palpations: Abdomen is soft.     Tenderness: There is no abdominal tenderness.  Musculoskeletal:     Cervical back: Neck supple.  Lymphadenopathy:     Cervical: No cervical adenopathy.  Skin:    General: Skin is warm and dry.     Findings: No rash.  Neurological:     Mental Status: He is alert and oriented to person, place, and time.  Psychiatric:        Behavior: Behavior normal.        Thought Content: Thought content normal.        Judgment: Judgment normal.      Depression screen [facility] 2/9 05/23/2020 06/17/2019 06/02/2018 01/30/2018 12/05/2015  Decreased Interest 0 0 0 0 0  Down, Depressed, Hopeless 0 0 0 0 0  PHQ - 2 Score 0 0 0 0 0       Assessment & Plan:    Patient Active Problem List   Diagnosis Date Noted  . Fracture, [place], right, closed 01/20/2020  . Healthcare maintenance 06/17/2019  . Tobacco use 06/17/2019  . Infestation by bed bug 11/02/2018  . Underweight 11/02/2018  . ERECTILE DYSFUNCTION 04/13/2008  . Unspecified glaucoma 08/19/2007  . DENTAL CARIES 08/19/2007  . AIDS (acquired immune deficiency syndrome) ([place]) 10/22/2006  . 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  . PNEUMOCYSTIS PNEUMONIA 10/22/2006  . COPD 10/22/2006  . SEIZURE DISORDER 10/22/2006     Problem List Items Addressed This Visit      Other   AIDS (acquired immune deficiency syndrome) ([place]) - Primary    [name] has well-controlled HIV/AIDS with good adherence and tolerance to his ART regimen of Symtuza.  No current signs/symptoms of opportunistic infection or progressive HIV disease.  Reviewed previous lab work and discussed plan of care.  Check blood work today.  Continue current dose of Symtuza.  Plan for follow-up in the 4 months or sooner if needed.      Relevant Medications   Darunavir-Cobicisctat-Emtricitabine-Tenofovir Alafenamide (SYMTUZA) 800-150-200-10 MG TABS   Other Relevant Orders   COMPLETE METABOLIC PANEL WITH GFR   HIV-1 RNA quant-no reflex-bld   T-helper cell (CD4)-  ([facility] clinic only)   Underweight    [name] BMI is currently 20 indicating he is of good weight.  He has no problems accessing food report.  Continue to monitor.      Healthcare maintenance     Discussed/recommended Covid vaccine.  Declines today.  Discussed importance of safe sexual practice to reduce risk of STI.  Condoms provided.  Due for routine dental care which she will check on.      Tobacco use    [name] continues to smoke tobacco at a rate of approximately 1 pack/day.  Discussed importance of tobacco cessation to reduce risk of cardiovascular, respiratory, malignant disease in the future.  Is in the precontemplation stage of quitting and is not ready to quit.  Continue to monitor.       Other  Visit Diagnoses    Pharmacologic therapy       Relevant Orders   Lipid panel   Screening for STDs (sexually transmitted diseases)       Relevant Orders   RPR       I have changed [name]'s Symtuza. I am also having him maintain his naproxen.   Meds ordered this encounter  Medications  . Darunavir-Cobicisctat-Emtricitabine-Tenofovir Alafenamide (SYMTUZA) 800-150-200-10 MG TABS    Sig: Take 1 tablet by mouth daily.    Dispense:  30 tablet    Refill:  5    Order Specific Question:   Supervising Provider    Answer:   Carlyle Basques [[id number]]     Follow-up: Return in about 4 months (around 03/07/2021), or if symptoms worsen or fail to improve.   Terri Piedra, MSN, FNP-C Nurse Practitioner [facility] [place] number: [phone]

## 2020-11-07 NOTE — Assessment & Plan Note (Signed)
[name] BMI is currently 20 indicating he is of good weight.  He has no problems accessing food report.  Continue to monitor.

## 2020-11-07 NOTE — Assessment & Plan Note (Signed)
Mr. Alter has well-controlled HIV/AIDS with good adherence and tolerance to his ART regimen of Symtuza.  No current signs/symptoms of opportunistic infection or progressive HIV disease.  Reviewed previous lab work and discussed plan of care.  Check blood work today.  Continue current dose of Symtuza.  Plan for follow-up in the 4 months or sooner if needed.

## 2020-11-08 LAB — T-HELPER CELL (CD4) - (RCID CLINIC ONLY)
CD4 % Helper T Cell: 14 % — ABNORMAL LOW (ref 33–65)
CD4 T Cell Abs: 270 /uL — ABNORMAL LOW (ref 400–1790)

## 2020-11-10 ENCOUNTER — Telehealth: Payer: Self-pay

## 2020-11-10 NOTE — Telephone Encounter (Signed)
[facility] Patient Advocate Encounter  [facility] and I have been unsuccsessful in reaching patient to be able to refill medication.    Symtuza last filled on 10/04/20  We have tried multiple times without a response.  Ileene Patrick, [facility] Patient [facility] Phone: [phone] Fax:  [phone]

## 2020-11-12 LAB — COMPLETE METABOLIC PANEL WITH GFR
AG Ratio: 1.4 (calc) (ref 1.0–2.5)
ALT: 15 U/L (ref 9–46)
AST: 14 U/L (ref 10–35)
Albumin: 4.3 g/dL (ref 3.6–5.1)
Alkaline phosphatase (APISO): 132 U/L (ref 35–144)
BUN/Creatinine Ratio: 12 (calc) (ref 6–22)
BUN: 17 mg/dL (ref 7–25)
CO2: 27 mmol/L (ref 20–32)
Calcium: 12.1 mg/dL — ABNORMAL HIGH (ref 8.6–10.3)
Chloride: 108 mmol/L (ref 98–110)
Creat: 1.38 mg/dL — ABNORMAL HIGH (ref 0.70–1.18)
GFR, Est African American: 60 mL/min/{1.73_m2} (ref >=60)
GFR, Est Non African American: 51 mL/min/{1.73_m2} — ABNORMAL LOW (ref >=60)
Globulin: 3 g/dL (calc) (ref 1.9–3.7)
Glucose, Bld: 81 mg/dL (ref 65–99)
Potassium: 4.5 mmol/L (ref 3.5–5.3)
Sodium: 140 mmol/L (ref 135–146)
Total Bilirubin: 0.4 mg/dL (ref 0.2–1.2)
Total Protein: 7.3 g/dL (ref 6.1–8.1)

## 2020-11-12 LAB — LIPID PANEL
Cholesterol: 133 mg/dL (ref <200)
HDL: 35 mg/dL — ABNORMAL LOW (ref >=40)
LDL Cholesterol (Calc): 84 mg/dL (calc)
Non-HDL Cholesterol (Calc): 98 mg/dL (calc) (ref <130)
Total CHOL/HDL Ratio: 3.8 (calc) (ref <5.0)
Triglycerides: 61 mg/dL (ref <150)

## 2020-11-12 LAB — HIV-1 RNA QUANT-NO REFLEX-BLD
HIV 1 RNA Quant: 39 Copies/mL — ABNORMAL HIGH
HIV-1 RNA Quant, Log: 1.59 Log cps/mL — ABNORMAL HIGH

## 2020-11-12 LAB — RPR: RPR Ser Ql: NONREACTIVE

## 2020-11-16 MED FILL — SYMTUZA 800-150-200-10 MG T: 800-150-200 | 30 days supply | Qty: 30 | Fill #0

## 2020-12-25 MED FILL — SYMTUZA 800-150-200-10 MG T: 800-150-200 | 30 days supply | Qty: 30 | Fill #1

## 2021-01-26 MED FILL — SYMTUZA 800-150-200-10 MG T: 800-150-200 | 30 days supply | Qty: 30 | Fill #2

## 2021-02-20 MED FILL — SYMTUZA 800-150-200-10 MG T: 800-150-200 | 30 days supply | Qty: 30 | Fill #3

## 2021-03-20 ENCOUNTER — Ambulatory Visit (INDEPENDENT_AMBULATORY_CARE_PROVIDER_SITE_OTHER): Payer: Medicare Other | Admitting: Family

## 2021-03-20 ENCOUNTER — Other Ambulatory Visit (HOSPITAL_COMMUNITY): Payer: Self-pay | Admitting: Family

## 2021-03-20 ENCOUNTER — Encounter: Payer: Self-pay | Admitting: Family

## 2021-03-20 ENCOUNTER — Other Ambulatory Visit: Payer: Self-pay

## 2021-03-20 VITALS — BP 132/80 | HR 67 | Temp 98.0°F

## 2021-03-20 DIAGNOSIS — Z0000 Encounter for general adult medical examination without abnormal findings: Secondary | ICD-10-CM | POA: Diagnosis not present

## 2021-03-20 DIAGNOSIS — B20 Human immunodeficiency virus [HIV] disease: Secondary | ICD-10-CM | POA: Diagnosis not present

## 2021-03-20 MED ORDER — SYMTUZA 800-150-200-10 MG PO TABS
1.0000 | ORAL_TABLET | Freq: Every day | ORAL | 5 refills | Status: DC
Start: 2021-03-20 — End: 2022-01-03

## 2021-03-20 NOTE — Assessment & Plan Note (Signed)
   Discussed importance of safe sexual practice to reduce risk of STI.  Condoms declined.  Vaccines are up-to-date per recommendations.  Has dentures with no indications for routine dental care presently.

## 2021-03-20 NOTE — Patient Instructions (Addendum)
Nice to see you.  We will check your lab work today.   Continue to take your Symtuza daily as prescribed.  Refills will be sent to the pharmacy.   Plan for follow up in 4 months or sooner if needed with lab work on the same day.   Have a great day and stay safe!

## 2021-03-20 NOTE — Progress Notes (Signed)
Subjective:    [name], male    DOB: 02-Mar-1951, 70 y.o.   MRN: [id number]  Chief Complaint  Patient presents with  . HIV Positive/AIDS     HPI:  [name] is a 70 y.o. male with HIV disease last seen on 11/07/2020 with well-controlled virus and good adherence and tolerance to his ART regimen of Symtuza.  Lab work with viral load that was undetectable and CD4 count of 270.  Here today for routine follow-up.  [name] continues to take his Symtuza daily as prescribed with no adverse side effects or missed doses since his last office visit.  Overall feeling well today with no new concerns/complaints.  Has been drinking Ensure and feeling better since previous office visit.  Does have occasional urinary urgency. Denies fevers, chills, night sweats, headaches, changes in vision, neck pain/stiffness, nausea, diarrhea, vomiting, lesions or rashes.  [name] has no problems obtaining medications from the pharmacy.  Denies feelings of being down, depressed, or hopeless recently.  No recreational or illicit drug use or alcohol consumption.  Continues to smoke approximately 1/2 pack of cigarettes per day.  Has dentures.  Declines condoms   Allergies  Allergen Reactions  . Penicillins Hives    Has patient had a PCN reaction causing immediate rash, facial/tongue/throat swelling, SOB or lightheadedness with hypotension: No Has patient had a PCN reaction causing severe rash involving mucus membranes or skin necrosis: No Has patient had a PCN reaction that required hospitalization: No Has patient had a PCN reaction occurring within the last 10 years: No If all of the above answers are "NO", then may proceed with Cephalosporin use.       Outpatient Medications Prior to Visit  Medication Sig Dispense Refill  . naproxen (NAPROSYN) 500 MG tablet Take 1 tablet (500 mg total) by mouth 2 (two) times daily with a meal. (Patient not taking: Reported on  11/07/2020) 60 tablet 2  . Darunavir-Cobicisctat-Emtricitabine-Tenofovir Alafenamide (SYMTUZA) 800-150-200-10 MG TABS Take 1 tablet by mouth daily. 30 tablet 5   No facility-administered medications prior to visit.     Past Medical History:  Diagnosis Date  . HIV (human immunodeficiency virus infection) ([place])      Past Surgical History:  Procedure Laterality Date  . HAND SURGERY         Review of Systems  Constitutional: Negative for appetite change, chills, fatigue, fever and unexpected weight change.  Eyes: Negative for visual disturbance.  Respiratory: Negative for cough, chest tightness, shortness of breath and wheezing.   Cardiovascular: Negative for chest pain and leg swelling.  Gastrointestinal: Negative for abdominal pain, constipation, diarrhea, nausea and vomiting.  Genitourinary: Negative for dysuria, flank pain, frequency, genital sores, hematuria and urgency.  Skin: Negative for rash.  Allergic/Immunologic: Negative for immunocompromised state.  Neurological: Negative for dizziness and headaches.      Objective:    BP 132/80   Pulse 67   Temp 98 F (36.7 C)  Nursing note and vital signs reviewed.  Physical Exam Constitutional:      General: He is not in acute distress.    Appearance: He is well-developed.  Eyes:     Conjunctiva/sclera: Conjunctivae normal.  Cardiovascular:     Rate and Rhythm: Normal rate and regular rhythm.     Heart sounds: Normal heart sounds. No murmur heard. No friction rub. No gallop.   Pulmonary:     Effort: Pulmonary effort is normal. No respiratory distress.     Breath  sounds: Normal breath sounds. No wheezing or rales.  Chest:     Chest wall: No tenderness.  Abdominal:     General: Bowel sounds are normal.     Palpations: Abdomen is soft.     Tenderness: There is no abdominal tenderness.  Musculoskeletal:     Cervical back: Neck supple.  Lymphadenopathy:     Cervical: No cervical adenopathy.  Skin:    General:  Skin is warm and dry.     Findings: No rash.  Neurological:     Mental Status: He is alert and oriented to person, place, and time.  Psychiatric:        Behavior: Behavior normal.        Thought Content: Thought content normal.        Judgment: Judgment normal.     Depression screen [facility] 2/9 05/23/2020 06/17/2019 06/02/2018 01/30/2018 12/05/2015  Decreased Interest 0 0 0 0 0  Down, Depressed, Hopeless 0 0 0 0 0  PHQ - 2 Score 0 0 0 0 0       Assessment & Plan:    Patient Active Problem List   Diagnosis Date Noted  . Fracture, [place], right, closed 01/20/2020  . Healthcare maintenance 06/17/2019  . Tobacco use 06/17/2019  . Infestation by bed bug 11/02/2018  . Underweight 11/02/2018  . ERECTILE DYSFUNCTION 04/13/2008  . Unspecified glaucoma 08/19/2007  . DENTAL CARIES 08/19/2007  . AIDS (acquired immune deficiency syndrome) ([place]) 10/22/2006  . 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  . PNEUMOCYSTIS PNEUMONIA 10/22/2006  . COPD 10/22/2006  . SEIZURE DISORDER 10/22/2006     Problem List Items Addressed This Visit      Other   AIDS (acquired immune deficiency syndrome) ([place]) - Primary    Mr. Mcclintock appears to have continually well-controlled HIV/AIDS with good adherence and tolerance to his ART regimen of Symtuza.  No signs/symptoms of opportunistic infection or progressive HIV disease.  We reviewed previous lab work and discussed plan of care.  Check blood work today.  Plan for follow-up in 4 months or sooner if needed with lab work on the same day.      Relevant Medications   Darunavir-Cobicisctat-Emtricitabine-Tenofovir Alafenamide (SYMTUZA) 800-150-200-10 MG TABS   Other Relevant Orders   HIV-1 RNA quant-no reflex-bld   T-helper cell (CD4)- ([facility] clinic only)   Healthcare maintenance     Discussed importance of safe sexual practice to reduce risk of STI.  Condoms declined.  Vaccines are up-to-date per recommendations.  Has dentures with no indications for routine dental care  presently.          I am having [name] maintain his naproxen and Symtuza.   Meds ordered this encounter  Medications  . Darunavir-Cobicisctat-Emtricitabine-Tenofovir Alafenamide (SYMTUZA) 800-150-200-10 MG TABS    Sig: Take 1 tablet by mouth daily.    Dispense:  30 tablet    Refill:  5    Order Specific Question:   Supervising Provider    Answer:   Carlyle Basques [[id number]]     Follow-up: Return in about 4 months (around 07/20/2021), or if symptoms worsen or fail to improve.   Terri Piedra, MSN, FNP-C Nurse Practitioner [facility] for [facility] Disease [place] number: [phone]

## 2021-03-20 NOTE — Assessment & Plan Note (Signed)
Mr. [name] appears to have continually well-controlled HIV/AIDS with good adherence and tolerance to his ART regimen of Symtuza.  No signs/symptoms of opportunistic infection or progressive HIV disease.  We reviewed previous lab work and discussed plan of care.  Check blood work today.  Plan for follow-up in 4 months or sooner if needed with lab work on the same day.

## 2021-03-21 LAB — T-HELPER CELL (CD4) - (RCID CLINIC ONLY)
CD4 % Helper T Cell: 16 % — ABNORMAL LOW (ref 33–65)
CD4 T Cell Abs: 280 /uL — ABNORMAL LOW (ref 400–1790)

## 2021-03-22 LAB — HIV-1 RNA QUANT-NO REFLEX-BLD
HIV 1 RNA Quant: NOT DETECTED Copies/mL
HIV-1 RNA Quant, Log: NOT DETECTED Log cps/mL

## 2021-03-22 MED FILL — SYMTUZA 800-150-200-10 MG T: 800-150-200 | 30 days supply | Qty: 30 | Fill #0

## 2021-03-30 ENCOUNTER — Other Ambulatory Visit (HOSPITAL_COMMUNITY): Payer: Self-pay

## 2021-04-17 ENCOUNTER — Other Ambulatory Visit (HOSPITAL_COMMUNITY): Payer: Self-pay

## 2021-04-17 MED FILL — Darunavir-Cobic-Emtricitab-Tenofov AF Tab 800-150-200-10 MG: ORAL | 30 days supply | Qty: 30 | Fill #0 | Status: AC

## 2021-04-19 ENCOUNTER — Other Ambulatory Visit (HOSPITAL_COMMUNITY): Payer: Self-pay

## 2021-05-15 ENCOUNTER — Other Ambulatory Visit (HOSPITAL_COMMUNITY): Payer: Self-pay

## 2021-05-17 ENCOUNTER — Other Ambulatory Visit (HOSPITAL_COMMUNITY): Payer: Self-pay

## 2021-05-21 ENCOUNTER — Other Ambulatory Visit (HOSPITAL_COMMUNITY): Payer: Self-pay

## 2021-05-22 ENCOUNTER — Other Ambulatory Visit (HOSPITAL_COMMUNITY): Payer: Self-pay

## 2021-05-22 MED FILL — Darunavir-Cobic-Emtricitab-Tenofov AF Tab 800-150-200-10 MG: ORAL | 30 days supply | Qty: 30 | Fill #1 | Status: AC

## 2021-06-12 ENCOUNTER — Other Ambulatory Visit (HOSPITAL_COMMUNITY): Payer: Self-pay

## 2021-06-14 ENCOUNTER — Other Ambulatory Visit (HOSPITAL_COMMUNITY): Payer: Self-pay

## 2021-07-17 ENCOUNTER — Ambulatory Visit: Payer: Medicare Other | Admitting: Family

## 2021-07-19 ENCOUNTER — Other Ambulatory Visit (HOSPITAL_COMMUNITY): Payer: Self-pay

## 2021-07-19 MED FILL — Darunavir-Cobic-Emtricitab-Tenofov AF Tab 800-150-200-10 MG: ORAL | 30 days supply | Qty: 30 | Fill #2 | Status: AC

## 2021-08-31 ENCOUNTER — Other Ambulatory Visit (HOSPITAL_COMMUNITY): Payer: Self-pay

## 2021-08-31 MED FILL — Darunavir-Cobic-Emtricitab-Tenofov AF Tab 800-150-200-10 MG: ORAL | 30 days supply | Qty: 30 | Fill #3 | Status: AC

## 2021-09-28 ENCOUNTER — Other Ambulatory Visit (HOSPITAL_COMMUNITY): Payer: Self-pay

## 2021-10-01 ENCOUNTER — Other Ambulatory Visit (HOSPITAL_COMMUNITY): Payer: Self-pay

## 2021-10-03 ENCOUNTER — Other Ambulatory Visit (HOSPITAL_COMMUNITY): Payer: Self-pay

## 2021-10-12 ENCOUNTER — Other Ambulatory Visit (HOSPITAL_COMMUNITY): Payer: Self-pay

## 2021-10-12 MED FILL — Darunavir-Cobic-Emtricitab-Tenofov AF Tab 800-150-200-10 MG: ORAL | 30 days supply | Qty: 30 | Fill #4 | Status: AC

## 2021-10-30 ENCOUNTER — Other Ambulatory Visit (HOSPITAL_COMMUNITY): Payer: Self-pay

## 2021-11-07 ENCOUNTER — Other Ambulatory Visit: Payer: Self-pay | Admitting: Family

## 2021-11-07 ENCOUNTER — Other Ambulatory Visit (HOSPITAL_COMMUNITY): Payer: Self-pay

## 2021-11-07 DIAGNOSIS — B20 Human immunodeficiency virus [HIV] disease: Secondary | ICD-10-CM

## 2021-11-07 MED ORDER — SYMTUZA 800-150-200-10 MG PO TABS
1.0000 | ORAL_TABLET | Freq: Every day | ORAL | 1 refills | Status: DC
  Filled 2021-11-07: qty 30, 30d supply, fill #0
  Filled 2021-12-07: qty 30, 30d supply, fill #1

## 2021-11-09 ENCOUNTER — Other Ambulatory Visit (HOSPITAL_COMMUNITY): Payer: Self-pay

## 2021-12-05 ENCOUNTER — Other Ambulatory Visit (HOSPITAL_COMMUNITY): Payer: Self-pay

## 2021-12-07 ENCOUNTER — Other Ambulatory Visit (HOSPITAL_COMMUNITY): Payer: Self-pay

## 2021-12-10 ENCOUNTER — Other Ambulatory Visit (HOSPITAL_COMMUNITY): Payer: Self-pay

## 2022-01-03 ENCOUNTER — Other Ambulatory Visit (HOSPITAL_COMMUNITY): Payer: Self-pay

## 2022-01-03 ENCOUNTER — Telehealth: Payer: Self-pay

## 2022-01-03 ENCOUNTER — Other Ambulatory Visit: Payer: Self-pay | Admitting: Family

## 2022-01-03 ENCOUNTER — Other Ambulatory Visit: Payer: Self-pay

## 2022-01-03 DIAGNOSIS — B20 Human immunodeficiency virus [HIV] disease: Secondary | ICD-10-CM

## 2022-01-03 MED ORDER — SYMTUZA 800-150-200-10 MG PO TABS
1.0000 | ORAL_TABLET | Freq: Every day | ORAL | 0 refills | Status: DC
  Filled 2022-01-03: qty 30, 30d supply, fill #0

## 2022-01-03 NOTE — Telephone Encounter (Signed)
Called patient to schedule overdue appointment, call could not be completed.   Beryle Flock, RN

## 2022-01-04 ENCOUNTER — Other Ambulatory Visit (HOSPITAL_COMMUNITY): Payer: Self-pay

## 2022-01-10 ENCOUNTER — Other Ambulatory Visit (HOSPITAL_COMMUNITY): Payer: Self-pay

## 2022-02-04 ENCOUNTER — Other Ambulatory Visit (HOSPITAL_COMMUNITY): Payer: Self-pay

## 2022-02-06 ENCOUNTER — Other Ambulatory Visit: Payer: Self-pay | Admitting: Family

## 2022-02-06 ENCOUNTER — Other Ambulatory Visit (HOSPITAL_COMMUNITY): Payer: Self-pay

## 2022-02-06 DIAGNOSIS — B20 Human immunodeficiency virus [HIV] disease: Secondary | ICD-10-CM

## 2022-02-08 ENCOUNTER — Other Ambulatory Visit: Payer: Self-pay | Admitting: Family

## 2022-02-08 ENCOUNTER — Other Ambulatory Visit (HOSPITAL_COMMUNITY): Payer: Self-pay

## 2022-02-08 DIAGNOSIS — B20 Human immunodeficiency virus [HIV] disease: Secondary | ICD-10-CM

## 2022-02-11 ENCOUNTER — Other Ambulatory Visit (HOSPITAL_COMMUNITY): Payer: Self-pay

## 2022-02-11 MED ORDER — SYMTUZA 800-150-200-10 MG PO TABS
1.0000 | ORAL_TABLET | Freq: Every day | ORAL | 0 refills | Status: DC
  Filled 2022-02-11: qty 30, 30d supply, fill #0

## 2022-03-04 ENCOUNTER — Other Ambulatory Visit (HOSPITAL_COMMUNITY): Payer: Self-pay

## 2022-03-05 ENCOUNTER — Other Ambulatory Visit (HOSPITAL_COMMUNITY): Payer: Self-pay

## 2022-03-05 ENCOUNTER — Other Ambulatory Visit: Payer: Self-pay | Admitting: Family

## 2022-03-05 DIAGNOSIS — B20 Human immunodeficiency virus [HIV] disease: Secondary | ICD-10-CM

## 2022-03-05 NOTE — Telephone Encounter (Signed)
Refills pending on follow up appt ?

## 2022-03-06 ENCOUNTER — Other Ambulatory Visit (HOSPITAL_COMMUNITY): Payer: Self-pay

## 2022-03-08 ENCOUNTER — Other Ambulatory Visit (HOSPITAL_COMMUNITY): Payer: Self-pay

## 2022-03-08 ENCOUNTER — Other Ambulatory Visit: Payer: Self-pay | Admitting: Family

## 2022-03-08 DIAGNOSIS — B20 Human immunodeficiency virus [HIV] disease: Secondary | ICD-10-CM

## 2022-03-11 ENCOUNTER — Telehealth: Payer: Self-pay

## 2022-03-11 ENCOUNTER — Other Ambulatory Visit (HOSPITAL_COMMUNITY): Payer: Self-pay

## 2022-03-11 MED ORDER — SYMTUZA 800-150-200-10 MG PO TABS
1.0000 | ORAL_TABLET | Freq: Every day | ORAL | 0 refills | Status: DC
  Filled 2022-03-11: qty 30, 30d supply, fill #0

## 2022-03-11 NOTE — Telephone Encounter (Signed)
Called patient to schedule overdue follow up appointment, no answer and unable to leave message. ? ?Beryle Flock, RN ? ?

## 2022-04-01 ENCOUNTER — Other Ambulatory Visit (HOSPITAL_COMMUNITY): Payer: Self-pay

## 2022-04-03 ENCOUNTER — Other Ambulatory Visit (HOSPITAL_COMMUNITY): Payer: Self-pay

## 2022-04-03 ENCOUNTER — Other Ambulatory Visit: Payer: Self-pay | Admitting: Family

## 2022-04-03 DIAGNOSIS — B20 Human immunodeficiency virus [HIV] disease: Secondary | ICD-10-CM

## 2022-04-04 ENCOUNTER — Other Ambulatory Visit (HOSPITAL_COMMUNITY): Payer: Self-pay

## 2022-04-05 ENCOUNTER — Other Ambulatory Visit (HOSPITAL_COMMUNITY): Payer: Self-pay

## 2022-04-09 ENCOUNTER — Other Ambulatory Visit (HOSPITAL_COMMUNITY): Payer: Self-pay

## 2022-07-09 ENCOUNTER — Other Ambulatory Visit (HOSPITAL_COMMUNITY): Payer: Self-pay

## 2022-08-03 ENCOUNTER — Emergency Department (HOSPITAL_COMMUNITY): Payer: Medicare Other

## 2022-08-03 ENCOUNTER — Inpatient Hospital Stay (HOSPITAL_COMMUNITY)
Admission: EM | Admit: 2022-08-03 | Discharge: 2022-08-10 | DRG: 640 | Disposition: A | Discharge status: Skilled Nursing Facility | Payer: Medicare Other | Attending: Internal Medicine | Admitting: Internal Medicine

## 2022-08-03 DIAGNOSIS — G9341 Metabolic encephalopathy: Secondary | ICD-10-CM | POA: Diagnosis present

## 2022-08-03 DIAGNOSIS — E870 Hyperosmolality and hypernatremia: Secondary | ICD-10-CM | POA: Diagnosis present

## 2022-08-03 DIAGNOSIS — E559 Vitamin D deficiency, unspecified: Secondary | ICD-10-CM | POA: Diagnosis present

## 2022-08-03 DIAGNOSIS — B20 Human immunodeficiency virus [HIV] disease: Secondary | ICD-10-CM | POA: Diagnosis present

## 2022-08-03 DIAGNOSIS — D6959 Other secondary thrombocytopenia: Secondary | ICD-10-CM | POA: Diagnosis present

## 2022-08-03 DIAGNOSIS — R32 Unspecified urinary incontinence: Secondary | ICD-10-CM | POA: Diagnosis present

## 2022-08-03 DIAGNOSIS — R627 Adult failure to thrive: Secondary | ICD-10-CM | POA: Diagnosis present

## 2022-08-03 DIAGNOSIS — Z681 Body mass index (BMI) 19 or less, adult: Secondary | ICD-10-CM

## 2022-08-03 DIAGNOSIS — R64 Cachexia: Secondary | ICD-10-CM | POA: Diagnosis present

## 2022-08-03 DIAGNOSIS — N210 Calculus in bladder: Secondary | ICD-10-CM

## 2022-08-03 DIAGNOSIS — N390 Urinary tract infection, site not specified: Secondary | ICD-10-CM | POA: Diagnosis present

## 2022-08-03 DIAGNOSIS — Z880 Allergy status to penicillin: Secondary | ICD-10-CM

## 2022-08-03 DIAGNOSIS — F17210 Nicotine dependence, cigarettes, uncomplicated: Secondary | ICD-10-CM | POA: Diagnosis present

## 2022-08-03 DIAGNOSIS — R410 Disorientation, unspecified: Secondary | ICD-10-CM

## 2022-08-03 DIAGNOSIS — D72818 Other decreased white blood cell count: Secondary | ICD-10-CM | POA: Diagnosis present

## 2022-08-03 DIAGNOSIS — Z79899 Other long term (current) drug therapy: Secondary | ICD-10-CM

## 2022-08-03 DIAGNOSIS — D351 Benign neoplasm of parathyroid gland: Secondary | ICD-10-CM | POA: Diagnosis present

## 2022-08-03 DIAGNOSIS — E43 Unspecified severe protein-calorie malnutrition: Secondary | ICD-10-CM | POA: Diagnosis present

## 2022-08-03 DIAGNOSIS — R471 Dysarthria and anarthria: Secondary | ICD-10-CM | POA: Diagnosis present

## 2022-08-03 DIAGNOSIS — R2689 Other abnormalities of gait and mobility: Secondary | ICD-10-CM

## 2022-08-03 LAB — CBC WITH DIFFERENTIAL/PLATELET
Abs Immature Granulocytes: 0.02 10*3/uL (ref 0.00–0.07)
Basophils Absolute: 0 10*3/uL (ref 0.0–0.1)
Basophils Relative: 0 %
Eosinophils Absolute: 0 10*3/uL (ref 0.0–0.5)
Eosinophils Relative: 1 %
HCT: 46.8 % (ref 39.0–52.0)
Hemoglobin: 14.9 g/dL (ref 13.0–17.0)
Immature Granulocytes: 1 %
Lymphocytes Relative: 37 %
Lymphs Abs: 1.3 10*3/uL (ref 0.7–4.0)
MCH: 29 pg (ref 26.0–34.0)
MCHC: 31.8 g/dL (ref 30.0–36.0)
MCV: 91.1 fL (ref 80.0–100.0)
Monocytes Absolute: 0.4 10*3/uL (ref 0.1–1.0)
Monocytes Relative: 12 %
Neutro Abs: 1.8 10*3/uL (ref 1.7–7.7)
Neutrophils Relative %: 49 %
Platelets: 85 10*3/uL — ABNORMAL LOW (ref 150–400)
RBC: 5.14 MIL/uL (ref 4.22–5.81)
RDW: 11.6 % (ref 11.5–15.5)
WBC: 3.6 10*3/uL — ABNORMAL LOW (ref 4.0–10.5)
nRBC: 0 % (ref 0.0–0.2)

## 2022-08-03 LAB — URINALYSIS, ROUTINE W REFLEX MICROSCOPIC
Bacteria, UA: NONE SEEN
Bilirubin Urine: NEGATIVE
Glucose, UA: NEGATIVE mg/dL
Ketones, ur: NEGATIVE mg/dL
Nitrite: POSITIVE — AB
Protein, ur: 100 mg/dL — AB
Specific Gravity, Urine: 1.013 (ref 1.005–1.030)
WBC, UA: >50 WBC/hpf — ABNORMAL HIGH (ref 0–5)
pH: 8 (ref 5.0–8.0)

## 2022-08-03 LAB — SEDIMENTATION RATE: Sed Rate: 60 mm/hr — ABNORMAL HIGH (ref 0–16)

## 2022-08-03 MED ORDER — CIPROFLOXACIN IN D5W 400 MG/200ML IV SOLN
400.0000 mg | Freq: Once | INTRAVENOUS | Status: AC
Start: 2022-08-03 — End: 2022-08-04
  Administered 2022-08-03: 400 mg via INTRAVENOUS
  Filled 2022-08-03: qty 200

## 2022-08-03 MED ORDER — FOSFOMYCIN TROMETHAMINE 3 G PO PACK
3.0000 g | PACK | Freq: Once | ORAL | Status: DC
  Filled 2022-08-03: qty 3

## 2022-08-03 MED ORDER — LACTATED RINGERS IV BOLUS
1000.0000 mL | Freq: Once | INTRAVENOUS | Status: AC
  Administered 2022-08-03: 1000 mL via INTRAVENOUS

## 2022-08-03 MED ORDER — IOHEXOL 300 MG/ML  SOLN
100.0000 mL | Freq: Once | INTRAMUSCULAR | Status: AC | PRN
  Administered 2022-08-03: 100 mL via INTRAVENOUS

## 2022-08-03 NOTE — ED Notes (Signed)
Pt given urinal and informed that we need urine sample

## 2022-08-03 NOTE — ED Notes (Signed)
Family at bedside report pt has been staying with them for the past month, prior to this family says he was staying at boarding house.

## 2022-08-03 NOTE — ED Provider Notes (Signed)
[facility] EMERGENCY DEPARTMENT Provider Note  CSN: [id number] Arrival date & time: 08/03/22 1854  Chief Complaint(s) Urinary Tract Infection  HPI [name] is a 71 y.o. male with PMH HIV on appropriate antiretroviral therapy, COPD who presents emergency department for evaluation of failure to thrive, decreased mobility and a suspected urinary tract infection.  History obtained primarily from the patient's girlfriend's daughter who does not know very much about his medical history but states that he has been frequently urinating on himself which is not normal for him.  Patient adamantly denies back pain, chest pain, shortness of breath, headache, fever or other systemic symptoms.  There has been some noted confusion as well.   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 Authorizing Provider  Darunavir-Cobicistat-Emtricitabine-Tenofovir Alafenamide (SYMTUZA) 800-150-200-10 MG TABS TAKE 1 TABLET BY MOUTH DAILY. 03/11/22 03/11/23  Golden Circle, FNP  naproxen (NAPROSYN) 500 MG tablet Take 1 tablet (500 mg total) by mouth 2 (two) times daily with a meal. Patient not taking: Reported on 11/07/2020 01/19/20   Carole Civil, MD                                                                                                                                     Past Surgical History Past Surgical History:  Procedure Laterality Date   HAND SURGERY     Family History Family History  Problem Relation Age of Onset   High blood pressure Mother     Social History Social History   Tobacco Use   Smoking status: Every Day    Packs/day: 1.50    Types: Cigarettes   Smokeless tobacco: Never  Vaping Use   Vaping Use: Never used  Substance Use Topics   Alcohol use: No   Drug use: No   Allergies Penicillins  Review of Systems Review of Systems  Genitourinary:  Positive for frequency.  Psychiatric/Behavioral:  Positive for confusion.     Physical Exam Vital Signs  I have reviewed the triage vital signs BP 129/78   Pulse 93   Temp 98.8 F (37.1 C) (Oral)   Resp 18   SpO2 97%   Physical Exam Constitutional:      General: He is not in acute distress.    Appearance: Normal appearance.  HENT:     Head: Normocephalic and atraumatic.     Nose: No congestion or rhinorrhea.  Eyes:     General:        Right eye: No discharge.        Left eye: No discharge.     Extraocular Movements: Extraocular movements intact.     Pupils: Pupils are equal, round, and reactive to light.  Cardiovascular:     Rate and Rhythm: Normal rate and regular rhythm.     Heart sounds: No murmur heard. Pulmonary:     Effort: No respiratory distress.     Breath sounds: No wheezing or rales.  Abdominal:     General: There is no distension.     Tenderness: There is no abdominal tenderness.  Musculoskeletal:        General: Normal range of motion.     Cervical back: Normal range of motion.  Skin:    General: Skin is warm and dry.  Neurological:     General: No focal deficit present.     Mental Status: He is alert.     ED Results and Treatments Labs (all labs ordered are listed, but only abnormal results are displayed) Labs Reviewed  COMPREHENSIVE METABOLIC PANEL - Abnormal; Notable for the following components:       Result Value   Glucose, Bld 100 (*)    Creatinine, Ser 1.59 (*)    Calcium 13.1 (*)    Albumin 3.3 (*)    GFR, Estimated 46 (*)    Anion gap 2 (*)    All other components within normal limits  CBC WITH DIFFERENTIAL/PLATELET - Abnormal; Notable for the following components:   WBC 3.6 (*)    Platelets 85 (*)    All other components within normal limits  URINALYSIS, ROUTINE W REFLEX MICROSCOPIC  SEDIMENTATION RATE  C-REACTIVE PROTEIN                                                                                                                          Radiology No results found.  Pertinent labs & imaging results that were available during my care of the patient were reviewed by me and considered in my medical decision making (see MDM for details).  Medications Ordered in ED Medications  lactated ringers bolus 1,000 mL (has no administration in time range)  Procedures .Critical Care  Performed by: Teressa Lower, MD Authorized by: Teressa Lower, MD   Critical care provider statement:    Critical care time (minutes):  30   Critical care was necessary to treat or prevent imminent or life-threatening deterioration of the following conditions:  Metabolic crisis   Critical care was time spent personally by me on the following activities:  Development of treatment plan with patient or surrogate, discussions with consultants, evaluation of patient's response to treatment, examination of patient, ordering and review of laboratory studies, ordering and review of radiographic studies, ordering and performing treatments and interventions, pulse oximetry, re-evaluation of patient's condition and review of old charts   (including critical care time)  Medical Decision Making / ED Course   This patient presents to the ED for concern of confusion, urinary  incontinence, weakness, this involves an extensive number of treatment options, and is a complaint that carries with it a high risk of complications and morbidity.  The differential diagnosis includes UTI, spinal cord compression, electrolyte abnormality, malignancy  MDM: Patient seen emergency room for evaluation of multiple complaints described above.  Physical exam reveals a cachectic appearing patient that smells heavily of urine and cigarette smoke.  Abdominal exam with some mild tenderness over the suprapubic region.  Patient has retained strength bilaterally in the lower extremities.  Laboratory evaluation with leukopenia to 3.6 and a platelet count of 85 likely secondary to his underlying HIV, creatinine is 1.59 but patient has a significant hypercalcemia at 13.1.  Fluid resuscitation begun for this.  Although the patient has had elevated calcium levels in the past, this is the highest it has been and his confusion may be secondary to his symptomatic hypercalcemia.  Also in the setting of potential immunocompromise, I obtained a CT chest abdomen pelvis to rule out malignancy as the source of his hypercalcemia that shows severe emphysema with right apical bulla formation, a 20 mm exophytic thyroid nodule versus parathyroid adenoma which may explain his hypercalcemia as well as a 2.7 cm bladder calculus with circumferential bladder wall thickening.  His urinalysis certainly is concerning for infection and he was started on ceftriaxone.  Patient admitted for symptomatic hypercalcemia and a urinary tract infection.   Additional history obtained: -Additional history obtained from family members -External records from outside source obtained and reviewed including: Chart review including previous notes, labs, imaging, consultation notes   Lab Tests: -I ordered, reviewed, and interpreted labs.   The pertinent results include:   Labs Reviewed  COMPREHENSIVE METABOLIC PANEL - Abnormal; Notable for the  following components:      Result Value   Glucose, Bld 100 (*)    Creatinine, Ser 1.59 (*)    Calcium 13.1 (*)    Albumin 3.3 (*)    GFR, Estimated 46 (*)    Anion gap 2 (*)    All other components within normal limits  CBC WITH DIFFERENTIAL/PLATELET - Abnormal; Notable for the following components:   WBC 3.6 (*)    Platelets 85 (*)    All other components within normal limits  URINALYSIS, ROUTINE W REFLEX MICROSCOPIC  SEDIMENTATION RATE  C-REACTIVE PROTEIN     Imaging Studies ordered: I ordered imaging studies including CT chest abdomen pelvis I independently visualized and interpreted imaging. I agree with the radiologist interpretation   Medicines ordered and prescription drug management: Meds ordered this encounter  Medications   lactated ringers bolus 1,000 mL    -I have reviewed the patients home medicines and have made  adjustments as needed  Critical interventions Fluid resuscitation, antibiotics   Cardiac Monitoring: The patient was maintained on a cardiac monitor.  I personally viewed and interpreted the cardiac monitored which showed an underlying rhythm of: NSR  Social Determinants of Health:  Factors impacting patients care include: none   Reevaluation: After the interventions noted above, I reevaluated the patient and found that they have :improved  Co morbidities that complicate the patient evaluation  Past Medical History:  Diagnosis Date   HIV (human immunodeficiency virus infection) ([place])       Dispostion: I considered admission for this patient, and due to symptomatic hypocalcemia and urinary tract infection patient will require admission     Final Clinical Impression(s) / ED Diagnoses Final diagnoses:  None     '@PCDICTATION'$ @    Teressa Lower, MD 08/04/22 1523

## 2022-08-03 NOTE — ED Notes (Signed)
Kommor at bedside talking with pt.

## 2022-08-03 NOTE — ED Notes (Signed)
Patient transported to CT 

## 2022-08-03 NOTE — ED Provider Notes (Signed)
  Provider Note MRN:  [id number]  Arrival date & time: 08/03/22    ED Course and Medical Decision Making  Assumed care from Dr. Matilde Sprang at shift change.  Symptomatic hypercalcemia, UTI, will need admission.  Awaiting CT imaging results.  11:45 PM update: On my exam patient seems fairly alert and oriented.  Here because of worsening functional status with trouble walking over the past several months, worse over the past week.  Feels unsteady on his feet.  And now more recently over the past few weeks having urinary incontinence, over the past few days foul odor to the urine and increased confusion.  Soft and benign abdomen.  CT scan of the abdomen revealing a bladder stone.  Patient does have a UTI.  Initial care team had ordered fosfomycin to treat the UTI but it seems that with a bladder stone this would not be considered a simple UTI.  Will provide ciprofloxacin.  Patient having progressive ambulatory issues and urinary incontinence and so normal pressure hydrocephalus is also considered, will add on CT head.  Still suspect need for admission.  CT imaging also suggestive of enlarged parathyroid gland and so PTH is pending.  12:30 AM update: Case was discussed with Dr. Jeffie Pollock of urology, given that patient is stable, not septic, urinating without issue there is no acute intervention regarding the bladder stone.  The stone sometimes is seeded with infection and can cause recurrent infection.  Given the size of the stone, it does need to be removed but more electively.  1:26 AM update: CT head is without acute process.  Will request hospitalist admission.  Procedures  Final Clinical Impressions(s) / ED Diagnoses     ICD-10-CM   1. Urinary tract infection without hematuria, site unspecified  N39.0       ED Discharge Orders     None       Discharge Instructions   None     Barth Kirks. Sedonia Small, [place] [email or web address]    Maudie Flakes, MD 08/04/22 [phone]

## 2022-08-03 NOTE — ED Notes (Signed)
Pt has been undressed completely, given a full body bed bath and malewick is in place. Pts belongings has been placed in patient bags. (Shirt, under shirt, pants, belt, socks, jacket, underwear and shoes)

## 2022-08-03 NOTE — ED Triage Notes (Signed)
Pt arrived via [facility] for urinating on himself for the past week, strong urine odor, needs assistance ambulating and getting to bathroom. Per EMS, BP WDL

## 2022-08-04 ENCOUNTER — Emergency Department (HOSPITAL_COMMUNITY): Payer: Medicare Other

## 2022-08-04 ENCOUNTER — Inpatient Hospital Stay (HOSPITAL_COMMUNITY): Payer: Medicare Other

## 2022-08-04 DIAGNOSIS — D351 Benign neoplasm of parathyroid gland: Secondary | ICD-10-CM | POA: Diagnosis present

## 2022-08-04 DIAGNOSIS — E559 Vitamin D deficiency, unspecified: Secondary | ICD-10-CM | POA: Diagnosis present

## 2022-08-04 DIAGNOSIS — N390 Urinary tract infection, site not specified: Secondary | ICD-10-CM | POA: Diagnosis present

## 2022-08-04 DIAGNOSIS — E43 Unspecified severe protein-calorie malnutrition: Secondary | ICD-10-CM | POA: Diagnosis present

## 2022-08-04 DIAGNOSIS — R627 Adult failure to thrive: Secondary | ICD-10-CM | POA: Diagnosis present

## 2022-08-04 DIAGNOSIS — R471 Dysarthria and anarthria: Secondary | ICD-10-CM

## 2022-08-04 DIAGNOSIS — Z515 Encounter for palliative care: Secondary | ICD-10-CM | POA: Diagnosis not present

## 2022-08-04 DIAGNOSIS — E870 Hyperosmolality and hypernatremia: Secondary | ICD-10-CM | POA: Diagnosis present

## 2022-08-04 DIAGNOSIS — G9341 Metabolic encephalopathy: Secondary | ICD-10-CM

## 2022-08-04 DIAGNOSIS — Z7189 Other specified counseling: Secondary | ICD-10-CM | POA: Diagnosis not present

## 2022-08-04 DIAGNOSIS — B20 Human immunodeficiency virus [HIV] disease: Secondary | ICD-10-CM

## 2022-08-04 DIAGNOSIS — F17210 Nicotine dependence, cigarettes, uncomplicated: Secondary | ICD-10-CM | POA: Diagnosis present

## 2022-08-04 DIAGNOSIS — D72818 Other decreased white blood cell count: Secondary | ICD-10-CM | POA: Diagnosis present

## 2022-08-04 DIAGNOSIS — R64 Cachexia: Secondary | ICD-10-CM | POA: Diagnosis present

## 2022-08-04 DIAGNOSIS — Z880 Allergy status to penicillin: Secondary | ICD-10-CM | POA: Diagnosis not present

## 2022-08-04 DIAGNOSIS — Z79899 Other long term (current) drug therapy: Secondary | ICD-10-CM | POA: Diagnosis not present

## 2022-08-04 DIAGNOSIS — Z681 Body mass index (BMI) 19 or less, adult: Secondary | ICD-10-CM | POA: Diagnosis not present

## 2022-08-04 DIAGNOSIS — R32 Unspecified urinary incontinence: Secondary | ICD-10-CM | POA: Diagnosis present

## 2022-08-04 DIAGNOSIS — D6959 Other secondary thrombocytopenia: Secondary | ICD-10-CM | POA: Diagnosis present

## 2022-08-04 LAB — COMPREHENSIVE METABOLIC PANEL
ALT: 17 U/L (ref 0–44)
AST: 21 U/L (ref 15–41)
Albumin: 3.2 g/dL — ABNORMAL LOW (ref 3.5–5.0)
Alkaline Phosphatase: 51 U/L (ref 38–126)
Anion gap: 3 — ABNORMAL LOW (ref 5–15)
BUN: 14 mg/dL (ref 8–23)
CO2: 29 mmol/L (ref 22–32)
Calcium: 12.7 mg/dL — ABNORMAL HIGH (ref 8.9–10.3)
Chloride: 107 mmol/L (ref 98–111)
Creatinine, Ser: 1.39 mg/dL — ABNORMAL HIGH (ref 0.61–1.24)
GFR, Estimated: 54 mL/min — ABNORMAL LOW (ref >60)
Glucose, Bld: 85 mg/dL (ref 70–99)
Potassium: 3.8 mmol/L (ref 3.5–5.1)
Sodium: 139 mmol/L (ref 135–145)
Total Bilirubin: 0.8 mg/dL (ref 0.3–1.2)
Total Protein: 7.2 g/dL (ref 6.5–8.1)

## 2022-08-04 LAB — CBC WITH DIFFERENTIAL/PLATELET
Abs Immature Granulocytes: 0.01 10*3/uL (ref 0.00–0.07)
Basophils Absolute: 0 10*3/uL (ref 0.0–0.1)
Basophils Relative: 1 %
Eosinophils Absolute: 0 10*3/uL (ref 0.0–0.5)
Eosinophils Relative: 1 %
HCT: 44.1 % (ref 39.0–52.0)
Hemoglobin: 14 g/dL (ref 13.0–17.0)
Immature Granulocytes: 0 %
Lymphocytes Relative: 43 %
Lymphs Abs: 1.3 10*3/uL (ref 0.7–4.0)
MCH: 29.2 pg (ref 26.0–34.0)
MCHC: 31.7 g/dL (ref 30.0–36.0)
MCV: 91.9 fL (ref 80.0–100.0)
Monocytes Absolute: 0.3 10*3/uL (ref 0.1–1.0)
Monocytes Relative: 11 %
Neutro Abs: 1.3 10*3/uL — ABNORMAL LOW (ref 1.7–7.7)
Neutrophils Relative %: 44 %
Platelets: 83 10*3/uL — ABNORMAL LOW (ref 150–400)
RBC: 4.8 MIL/uL (ref 4.22–5.81)
RDW: 11.8 % (ref 11.5–15.5)
WBC: 2.9 10*3/uL — ABNORMAL LOW (ref 4.0–10.5)
nRBC: 0 % (ref 0.0–0.2)

## 2022-08-04 LAB — C-REACTIVE PROTEIN: CRP: 0.7 mg/dL (ref <1.0)

## 2022-08-04 LAB — MAGNESIUM: Magnesium: 1.8 mg/dL (ref 1.7–2.4)

## 2022-08-04 LAB — TSH: TSH: 1.352 u[IU]/mL (ref 0.350–4.500)

## 2022-08-04 MED ORDER — OXYCODONE HCL 5 MG PO TABS
5.0000 mg | ORAL_TABLET | ORAL | Status: DC | PRN
  Administered 2022-08-08 – 2022-08-09 (×2): 5 mg via ORAL
  Filled 2022-08-04 (×2): qty 1

## 2022-08-04 MED ORDER — SODIUM CHLORIDE 0.9 % IV SOLN: Freq: Once | INTRAVENOUS | Status: AC

## 2022-08-04 MED ORDER — ENSURE ENLIVE PO LIQD
237.0000 mL | Freq: Three times a day (TID) | ORAL | Status: DC
Start: 2022-08-04 — End: 2022-08-10
  Administered 2022-08-04 – 2022-08-10 (×16): 237 mL via ORAL

## 2022-08-04 MED ORDER — SODIUM CHLORIDE 0.9 % IV SOLN
1.0000 g | INTRAVENOUS | Status: DC
  Administered 2022-08-04 – 2022-08-05 (×2): 1 g via INTRAVENOUS
  Filled 2022-08-04 (×3): qty 10

## 2022-08-04 MED ORDER — ACETAMINOPHEN 325 MG PO TABS: 650.0000 mg | ORAL_TABLET | Freq: Four times a day (QID) | ORAL | Status: DC | PRN

## 2022-08-04 MED ORDER — CALCITONIN (SALMON) 200 UNIT/ML IJ SOLN
4.0000 [IU]/kg | Freq: Two times a day (BID) | INTRAMUSCULAR | Status: AC
  Administered 2022-08-04 – 2022-08-05 (×4): 238 [IU] via INTRAMUSCULAR
  Filled 2022-08-04 (×4): qty 1.19

## 2022-08-04 MED ORDER — ONDANSETRON HCL 4 MG/2ML IJ SOLN: 4.0000 mg | Freq: Four times a day (QID) | INTRAMUSCULAR | Status: DC | PRN

## 2022-08-04 MED ORDER — ONDANSETRON HCL 4 MG PO TABS: 4.0000 mg | ORAL_TABLET | Freq: Four times a day (QID) | ORAL | Status: DC | PRN

## 2022-08-04 MED ORDER — MORPHINE SULFATE (PF) 2 MG/ML IV SOLN: 2.0000 mg | INTRAVENOUS | Status: DC | PRN

## 2022-08-04 MED ORDER — SODIUM CHLORIDE 0.9 % IV SOLN: INTRAVENOUS | Status: DC

## 2022-08-04 MED ORDER — HEPARIN SODIUM (PORCINE) 5000 UNIT/ML IJ SOLN
5000.0000 [IU] | Freq: Three times a day (TID) | INTRAMUSCULAR | Status: DC
  Administered 2022-08-04 – 2022-08-09 (×17): 5000 [IU] via SUBCUTANEOUS
  Filled 2022-08-04 (×17): qty 1

## 2022-08-04 MED ORDER — ORAL CARE MOUTH RINSE
15.0000 mL | OROMUCOSAL | Status: DC | PRN
Start: 2022-08-04 — End: 2022-08-10

## 2022-08-04 MED ORDER — POLYETHYLENE GLYCOL 3350 17 G PO PACK: 17.0000 g | PACK | Freq: Every day | ORAL | Status: DC | PRN

## 2022-08-04 MED ORDER — ACETAMINOPHEN 650 MG RE SUPP: 650.0000 mg | Freq: Four times a day (QID) | RECTAL | Status: DC | PRN

## 2022-08-04 MED ORDER — MORPHINE SULFATE (PF) 2 MG/ML IV SOLN: 0.5000 mg | INTRAVENOUS | Status: DC | PRN

## 2022-08-04 MED ORDER — DARUN-COBIC-EMTRICIT-TENOFAF 800-150-200-10 MG PO TABS
1.0000 | ORAL_TABLET | Freq: Every day | ORAL | Status: DC
Start: 2022-08-04 — End: 2022-08-10
  Administered 2022-08-05 – 2022-08-10 (×6): 1 via ORAL
  Filled 2022-08-04 (×9): qty 1

## 2022-08-04 MED ORDER — ZOLEDRONIC ACID 4 MG/5ML IV CONC
4.0000 mg | Freq: Once | INTRAVENOUS | Status: AC
  Administered 2022-08-04: 4 mg via INTRAVENOUS
  Filled 2022-08-04: qty 5

## 2022-08-04 NOTE — Plan of Care (Signed)

## 2022-08-04 NOTE — NC FL2 (Signed)
[place] LEVEL OF CARE SCREENING TOOL     IDENTIFICATION  Patient Name: [name] Birthdate: 11-22-1951 Sex: male Admission Date (Current Location): 08/03/2022  [facility] and [place] Number:  [employer] and Address:  [place] [address], [place]      Provider Number: [id number]  Attending Physician Name and Address:  Murlean Iba, MD  Relative Name and Phone Number:       Current Level of Care: Hospital Recommended Level of Care: [place] Prior Approval Number:    Date Approved/Denied:   PASRR Number: [id number]  Discharge Plan: SNF    Current Diagnoses: Patient Active Problem List   Diagnosis Date Noted   Failure to thrive in adult 10/93/2355   Acute metabolic encephalopathy 73/22/0254   Hypercalcemia 08/04/2022   UTI (urinary tract infection) 08/04/2022   Dysarthria 08/04/2022   Urinary incontinence    Fracture, Colles, right, closed 01/20/2020   Healthcare maintenance 06/17/2019   Tobacco use 06/17/2019   Infestation by bed bug 11/02/2018   Underweight 11/02/2018   ERECTILE DYSFUNCTION 04/13/2008   Unspecified glaucoma 08/19/2007   DENTAL CARIES 08/19/2007   AIDS (acquired immune deficiency syndrome) ([place]) 10/22/2006   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   PNEUMOCYSTIS PNEUMONIA 10/22/2006   COPD 10/22/2006   SEIZURE DISORDER 10/22/2006    Orientation RESPIRATION BLADDER Height & Weight     Self, Time, Situation, Place  Normal Incontinent, External catheter Weight: 131 lb 2.8 oz (59.5 kg) Height:  '5\' 11"'$  (180.3 cm)  BEHAVIORAL SYMPTOMS/MOOD NEUROLOGICAL BOWEL NUTRITION STATUS      Incontinent Diet (Regular)  AMBULATORY STATUS COMMUNICATION OF NEEDS Skin   Extensive Assist Verbally Normal                       Personal Care Assistance Level of Assistance  Bathing, Feeding, Dressing Bathing Assistance: Limited assistance Feeding assistance: Independent Dressing  Assistance: Limited assistance     Functional Limitations Info  Sight, Hearing, Speech Sight Info: Impaired Hearing Info: Adequate Speech Info: Adequate    SPECIAL CARE FACTORS FREQUENCY  PT (By licensed PT), OT (By licensed OT)     PT Frequency: 5 times weekly OT Frequency: 5 times weekly            Contractures Contractures Info: Not present    Additional Factors Info  Code Status, Allergies Code Status Info: FULL Allergies Info: Penicillins           Current Medications (08/04/2022):  This is the current hospital active medication list Current Facility-Administered Medications  Medication Dose Route Frequency Provider Last Rate Last Admin   0.9 %  sodium chloride infusion   Intravenous Continuous Wynetta Emery, Clanford L, MD 70 mL/hr at 08/04/22 0824 New Bag at 08/04/22 0824   acetaminophen (TYLENOL) tablet 650 mg  650 mg Oral Q6H PRN Zierle-Ghosh, Asia B, DO       Or   acetaminophen (TYLENOL) suppository 650 mg  650 mg Rectal Q6H PRN Zierle-Ghosh, Asia B, DO       calcitonin (MIACALCIN) injection 238 Units  4 Units/kg Intramuscular BID Johnson, Clanford L, MD   238 Units at 08/04/22 0948   cefTRIAXone (ROCEPHIN) 1 g in sodium chloride 0.9 % 100 mL IVPB  1 g Intravenous Q24H Johnson, Clanford L, MD 200 mL/hr at 08/04/22 1135 1 g at 08/04/22 1135   Darunavir-Cobicistat-Emtricitabine-Tenofovir Alafenamide (SYMTUZA) 800-150-200-10 MG TABS 1 tablet  1 tablet Oral Daily Zierle-Ghosh, [place]  B, DO       heparin injection 5,000 Units  5,000 Units Subcutaneous Q8H Zierle-Ghosh, Asia B, DO   5,000 Units at 08/04/22 4401   morphine (PF) 2 MG/ML injection 0.5 mg  0.5 mg Intravenous Q4H PRN Johnson, Clanford L, MD       ondansetron (ZOFRAN) tablet 4 mg  4 mg Oral Q6H PRN Zierle-Ghosh, Asia B, DO       Or   ondansetron (ZOFRAN) injection 4 mg  4 mg Intravenous Q6H PRN Zierle-Ghosh, Asia B, DO       oxyCODONE (Oxy IR/ROXICODONE) immediate release tablet 5 mg  5 mg Oral Q4H PRN Zierle-Ghosh,  Asia B, DO       polyethylene glycol (MIRALAX / GLYCOLAX) packet 17 g  17 g Oral Daily PRN Zierle-Ghosh, Asia B, DO         Discharge Medications: Please see discharge summary for a list of discharge medications.  Relevant Imaging Results:  Relevant Lab Results:   Additional Information SSN: [id number] [place]

## 2022-08-04 NOTE — ED Notes (Signed)
Dr Sedonia Small at bedside updating pt and family on results and pt disposition

## 2022-08-04 NOTE — Assessment & Plan Note (Addendum)
-  pt reported that he has been out of meds x 4-5 months, having difficulty with transportation  -CD4 count 238 and Viral load 38K -restarted home medication Symtuza -appointment made for [facility] for 08/14/22 at 2:30 pm

## 2022-08-04 NOTE — Progress Notes (Signed)
ASSUMPTION OF CARE NOTE   08/04/2022 12:07 PM  [name] was seen and examined.  The H&P by the admitting provider, orders, imaging was reviewed.  Please see new orders.  Will continue to follow.   Assessment and Plan: * Failure to thrive in adult - Generalized weakness and general deterioration over time -AIDS likely contributing -At this time UTI and hypercalcemia also contributing -TOC consult to advise on available resources for this patient -Continue hydration, encourage nutrient dense food options, PT/OT  Dysarthria -Dysarthria for one week -No associated difficulty swallowing, or asymmetric weakness -no numbness -likely secondary to hypercalcemia -ST consult -Continue to monitor  UTI (urinary tract infection) - UA indicative of UTI - Urine culture pending - Patient was started on Cipro in the ED, but appreciate pharmacy researching if patient can tolerate cephalosporins - planning 3 doses of ceftriaxone   Hypercalcemia - Calcium is 13.1 -Parathyroid nodule on imaging - Check PTH and ionized calcium - order IV normal saline, start calcitonin x 4 doses, given IV zometa x 1 dose  Acute metabolic encephalopathy - Described as confusion -Likely secondary to UTI -CT head is without acute changes -Other contributing factors may be electrolyte derangement with calcium being 13.1 -Continue to correct electrolytes, continue treatment of UTI, reorient as needed  AIDS (acquired immune deficiency syndrome) ([place]) - Infectious disease will be able to do a video consult. -pt reports that he has been out of meds x 4-5 months, having difficulty with transportation  -CD4 count pending and Viral load pending -restart home medication Symtuza -Continue to monitor   Vitals:   08/04/22 0630 08/04/22 1037  BP: (!) 129/92 133/81  Pulse: 77 86  Resp: 18 16  Temp: 98.2 F (36.8 C) 98.6 F (37 C)  SpO2: 100% 100%    Results for orders placed or performed during the  hospital encounter of 08/03/22  Comprehensive metabolic panel  Result Value Ref Range   Sodium 140 135 - 145 mmol/L   Potassium 3.5 3.5 - 5.1 mmol/L   Chloride 109 98 - 111 mmol/L   CO2 29 22 - 32 mmol/L   Glucose, Bld 100 (H) 70 - 99 mg/dL   BUN 17 8 - 23 mg/dL   Creatinine, Ser 1.59 (H) 0.61 - 1.24 mg/dL   Calcium 13.1 (HH) 8.9 - 10.3 mg/dL   Total Protein 7.3 6.5 - 8.1 g/dL   Albumin 3.3 (L) 3.5 - 5.0 g/dL   AST 20 15 - 41 U/L   ALT 18 0 - 44 U/L   Alkaline Phosphatase 51 38 - 126 U/L   Total Bilirubin 0.7 0.3 - 1.2 mg/dL   GFR, Estimated 46 (L) >60 mL/min   Anion gap 2 (L) 5 - 15  CBC with Differential  Result Value Ref Range   WBC 3.6 (L) 4.0 - 10.5 K/uL   RBC 5.14 4.22 - 5.81 MIL/uL   Hemoglobin 14.9 13.0 - 17.0 g/dL   HCT 46.8 39.0 - 52.0 %   MCV 91.1 80.0 - 100.0 fL   MCH 29.0 26.0 - 34.0 pg   MCHC 31.8 30.0 - 36.0 g/dL   RDW 11.6 11.5 - 15.5 %   Platelets 85 (L) 150 - 400 K/uL   nRBC 0.0 0.0 - 0.2 %   Neutrophils Relative % 49 %   Neutro Abs 1.8 1.7 - 7.7 K/uL   Lymphocytes Relative 37 %   Lymphs Abs 1.3 0.7 - 4.0 K/uL   Monocytes Relative 12 %   Monocytes  Absolute 0.4 0.1 - 1.0 K/uL   Eosinophils Relative 1 %   Eosinophils Absolute 0.0 0.0 - 0.5 K/uL   Basophils Relative 0 %   Basophils Absolute 0.0 0.0 - 0.1 K/uL   Immature Granulocytes 1 %   Abs Immature Granulocytes 0.02 0.00 - 0.07 K/uL  Urinalysis, Routine w reflex microscopic Urine, Clean Catch  Result Value Ref Range   Color, Urine AMBER (A) YELLOW   APPearance TURBID (A) CLEAR   Specific Gravity, Urine 1.013 1.005 - 1.030   pH 8.0 5.0 - 8.0   Glucose, UA NEGATIVE NEGATIVE mg/dL   Hgb urine dipstick MODERATE (A) NEGATIVE   Bilirubin Urine NEGATIVE NEGATIVE   Ketones, ur NEGATIVE NEGATIVE mg/dL   Protein, ur 100 (A) NEGATIVE mg/dL   Nitrite POSITIVE (A) NEGATIVE   Leukocytes,Ua LARGE (A) NEGATIVE   RBC / HPF 11-20 0 - 5 RBC/hpf   WBC, UA >50 (H) 0 - 5 WBC/hpf   Bacteria, UA NONE SEEN NONE SEEN    Squamous Epithelial / LPF 0-5 0 - 5   WBC Clumps PRESENT    Budding Yeast PRESENT   Sedimentation rate  Result Value Ref Range   Sed Rate 60 (H) 0 - 16 mm/hr  Comprehensive metabolic panel  Result Value Ref Range   Sodium 139 135 - 145 mmol/L   Potassium 3.8 3.5 - 5.1 mmol/L   Chloride 107 98 - 111 mmol/L   CO2 29 22 - 32 mmol/L   Glucose, Bld 85 70 - 99 mg/dL   BUN 14 8 - 23 mg/dL   Creatinine, Ser 1.39 (H) 0.61 - 1.24 mg/dL   Calcium 12.7 (H) 8.9 - 10.3 mg/dL   Total Protein 7.2 6.5 - 8.1 g/dL   Albumin 3.2 (L) 3.5 - 5.0 g/dL   AST 21 15 - 41 U/L   ALT 17 0 - 44 U/L   Alkaline Phosphatase 51 38 - 126 U/L   Total Bilirubin 0.8 0.3 - 1.2 mg/dL   GFR, Estimated 54 (L) >60 mL/min   Anion gap 3 (L) 5 - 15  Magnesium  Result Value Ref Range   Magnesium 1.8 1.7 - 2.4 mg/dL  CBC with Differential/Platelet  Result Value Ref Range   WBC 2.9 (L) 4.0 - 10.5 K/uL   RBC 4.80 4.22 - 5.81 MIL/uL   Hemoglobin 14.0 13.0 - 17.0 g/dL   HCT 44.1 39.0 - 52.0 %   MCV 91.9 80.0 - 100.0 fL   MCH 29.2 26.0 - 34.0 pg   MCHC 31.7 30.0 - 36.0 g/dL   RDW 11.8 11.5 - 15.5 %   Platelets 83 (L) 150 - 400 K/uL   nRBC 0.0 0.0 - 0.2 %   Neutrophils Relative % 44 %   Neutro Abs 1.3 (L) 1.7 - 7.7 K/uL   Lymphocytes Relative 43 %   Lymphs Abs 1.3 0.7 - 4.0 K/uL   Monocytes Relative 11 %   Monocytes Absolute 0.3 0.1 - 1.0 K/uL   Eosinophils Relative 1 %   Eosinophils Absolute 0.0 0.0 - 0.5 K/uL   Basophils Relative 1 %   Basophils Absolute 0.0 0.0 - 0.1 K/uL   WBC Morphology MORPHOLOGY UNREMARKABLE    RBC Morphology MORPHOLOGY UNREMARKABLE    Smear Review PLATELET COUNT CONFIRMED BY SMEAR    Immature Granulocytes 0 %   Abs Immature Granulocytes 0.01 0.00 - 0.07 K/uL  TSH  Result Value Ref Range   TSH 1.352 0.350 - 4.500 uIU/mL   Prolonged services  time: 56 mins   Murvin Natal, MD [facility]   08/03/2022  6:58 PM How to contact the [facility] Attending or Consulting provider [place] or  covering provider during after hours [place], for this patient?  Check the care team in [facility] and look for a) attending/consulting [facility] provider listed and b) the [facility] team listed Log into [email or web address] and use [place]'s universal password to access. If you do not have the password, please contact the hospital operator. Locate the [facility] provider you are looking for under [facility] and page to a number that you can be directly reached. If you still have difficulty reaching the provider, please page the [facility] (Director on Call) for the Hospitalists listed on amion for assistance.

## 2022-08-04 NOTE — Plan of Care (Signed)
  Problem: Acute Rehab PT Goals(only PT should resolve) Goal: Pt Will Go Supine/Side To Sit Outcome: Progressing Flowsheets (Taken 08/04/2022 1204) Pt will go Supine/Side to Sit:  with minimal assist  with min guard assist Goal: Patient Will Transfer Sit To/From Stand Outcome: Progressing Flowsheets (Taken 08/04/2022 1204) Patient will transfer sit to/from stand:  with min guard assist  with minimal assist Goal: Pt Will Transfer Bed To Chair/Chair To Bed Outcome: Progressing Flowsheets (Taken 08/04/2022 1204) Pt will Transfer Bed to Chair/Chair to Bed: with min assist Goal: Pt Will Ambulate Outcome: Progressing Flowsheets (Taken 08/04/2022 1204) Pt will Ambulate:  50 feet  with minimal assist  with rolling walker   12:05 PM, 08/04/22 Lonell Grandchild, MPT Physical Therapist with [facility] [phone] office [phone] mobile phone

## 2022-08-04 NOTE — Progress Notes (Signed)
Patient up to chair today for a few hours, tolerated well. Patient tolerated Rocephin with no adverse reaction. Denies pain and discomfort. Poor appetite this shift, encouraged to eat patient states he is just not hungry .

## 2022-08-04 NOTE — Assessment & Plan Note (Addendum)
-  Likely secondary to hypercalcemia and UTI -CT head is without acute changes -improving slowly with supportive care and improving calcium

## 2022-08-04 NOTE — H&P (Signed)
History and Physical    Patient: [name] DOB: [birth year] DOA: 08/03/2022 DOS: the patient was seen and examined on 08/04/2022 PCP: Pcp, No  Patient coming from: Home  Chief Complaint:  Chief Complaint  Patient presents with   Urinary Tract Infection   HPI: [name] is a 71 y.o. male with medical history significant of HIV/AIDS and tobacco use disorder presents the ED with a chief complaint of polyuria.  Patient reports that he had polyuria for 1 month.  He has no dysuria, no hematuria.  Patient reports he has no pain associated with this.  He has had no fevers.  Patient also denies chest pain, shortness of breath, palpitations, nausea, vomiting, diarrhea, constipation, pain anywhere.  He does report a decreased appetite.  Patient appears emaciated, but swears that he is eating regularly just a decreased amount.  Patient's speech is slurred and difficult to understand.  Girlfriend at bedside reports that this started last week.  Patient thinks it may have started 2 or 3 months ago.  He has been confused per girlfriend.  Just saying things that do not make sense.  It is reported that they cannot get patient out of bed today because of generalized weakness, the patient reports that he has not felt any generalized or asymmetric weakness.  Again this could be due to the confusion.  Patient has no other complaints at this time.  Patient is a current smoker, but declines nicotine patch at this time.  He does not drink, does not use illicit drugs, is not vaccinated for COVID.  Patient is full code. Review of Systems: As mentioned in the history of present illness. All other systems reviewed and are negative.  Accuracy of review of systems is questionable given patient's confusion. Past Medical History:  Diagnosis Date   HIV (human immunodeficiency virus infection) ([place])    Past Surgical History:  Procedure Laterality Date   HAND SURGERY     Social History:  reports  that he has been smoking cigarettes. He has been smoking an average of 1.5 packs per day. He has never used smokeless tobacco. He reports that he does not drink alcohol and does not use drugs.  Allergies  Allergen Reactions   Penicillins Hives    Has patient had a PCN reaction causing immediate rash, facial/tongue/throat swelling, SOB or lightheadedness with hypotension: No Has patient had a PCN reaction causing severe rash involving mucus membranes or skin necrosis: No Has patient had a PCN reaction that required hospitalization: No Has patient had a PCN reaction occurring within the last 10 years: No If all of the above answers are "NO", then may proceed with Cephalosporin use.     Family History  Problem Relation Age of Onset   High blood pressure Mother     Prior to Admission medications   Medication Sig Start Date End Date Taking? Authorizing Provider  Darunavir-Cobicistat-Emtricitabine-Tenofovir Alafenamide (SYMTUZA) 800-150-200-10 MG TABS TAKE 1 TABLET BY MOUTH DAILY. 03/11/22 03/11/23  Golden Circle, FNP  naproxen (NAPROSYN) 500 MG tablet Take 1 tablet (500 mg total) by mouth 2 (two) times daily with a meal. Patient not taking: Reported on 11/07/2020 01/19/20   Carole Civil, MD    Physical Exam: Vitals:   08/04/22 0000 08/04/22 0100 08/04/22 0205 08/04/22 0330  BP: (!) 131/91 117/79 122/80 132/75  Pulse: 90 89 91 83  Resp: '14 20 20 20  '$ Temp:  98.4 F (36.9 C)    TempSrc:  Oral  SpO2: 96% 96% 97% 98%    1.  General: Patient lying supine in bed, emaciated, no acute distress   2. Psychiatric: Alert and oriented x 3 with subtle confusion noted during exam, mood and behavior normal for situation, pleasant and cooperative with exam   3. Neurologic: Speech is slurred and language is coherent, face is symmetric, moves all 4 extremities voluntarily, at baseline without acute deficits on limited exam   4. HEENMT:  Head is atraumatic, normocephalic, pupils  reactive to light, neck is supple, trachea is midline, mucous membranes are moist   5. Respiratory : Lungs are clear to auscultation bilaterally without wheezing, rhonchi, rales, no cyanosis, no increase in work of breathing or accessory muscle use, minimal dry cough when asked to take a deep breath   6. Cardiovascular : Heart rate normal, rhythm is regular, no murmurs, rubs or gallops, no peripheral edema, peripheral pulses palpated   7. Gastrointestinal:  Abdomen is soft, slightly distended, nontender to palpation bowel sounds active, no masses or organomegaly palpated   8. Skin:  Skin is warm, dry and intact without rashes, acute lesions, or ulcers on limited exam   9.Musculoskeletal:  No acute deformities or trauma, no asymmetry in tone, no peripheral edema, peripheral pulses palpated, no tenderness to palpation in the extremities   Data Reviewed: In the ED Temp 98.2, heart rate 89-95, respiratory rate 13-20, blood pressure 105/76-141/91, satting at 96% Leukopenia at 3.6, hemoglobin 14.9, thrombocytopenia 85 Creatinine is near to baseline at 1.59 when last measured 2 years ago 1.38 Calcium is 13.1 Albumin 3.3 CT abdomen pelvis shows no acute changes. 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 adenoma UA is indicative of UTI Patient started on Cipro secondary to penicillin allergy Admission requested for failure to thrive and hypercalcemia   Assessment and Plan: * Failure to thrive in adult - Generalized weakness and general deterioration over time -AIDS likely contributing -At this time UTI and hypercalcemia also contributing -TOC consult to advise on available resources for this patient -Continue hydration, encourage nutrient dense food options, PT/OT  UTI (urinary tract infection) - UA indicative of UTI - Urine culture pending - Patient was started on Cipro in the ED, but appreciate pharmacy researching if patient can  tolerate cephalosporins - No previous urine culture to compare - We will trend leukopenia with a.m. labs -Blood pressure holding stable - Continue to monitor  Hypercalcemia - Calcium is 13.1 -Parathyroid nodule on imaging - Check PTH and ionized calcium - 1 L fluid given in the ED - Medications reviewed and none of his home med should be contributing to hypercalcemia - Continue IV hydration -Continue to monitor on telemetry  Acute metabolic encephalopathy - Described as confusion -Likely secondary to UTI -CT head is without acute changes -Other contributing factors may be electrolyte derangement with calcium being 13.1 -Continue to correct electrolytes, continue treatment of UTI, reorient as needed  AIDS (acquired immune deficiency syndrome) ([place]) - Infectious disease will be able to do a video consult. -CD4 count pending -Continue home medication Symtuza -Continue to monitor      Advance Care Planning:   Code Status: Not on file full  Consults: ID  Family Communication: Girlfriend at bedside  Severity of Illness: The appropriate patient status for this patient is INPATIENT. Inpatient status is judged to be reasonable and necessary in order to provide the required intensity of service to ensure the patient's safety. The patient's presenting symptoms,  physical exam findings, and initial radiographic and laboratory data in the context of their chronic comorbidities is felt to place them at high risk for further clinical deterioration. Furthermore, it is not anticipated that the patient will be medically stable for discharge from the hospital within 2 midnights of admission.   * I certify that at the point of admission it is my clinical judgment that the patient will require inpatient hospital care spanning beyond 2 midnights from the point of admission due to high intensity of service, high risk for further deterioration and high frequency of surveillance  required.*  Author: Rolla Plate, DO 08/04/2022 4:10 AM  For on call review [email or web address].

## 2022-08-04 NOTE — Evaluation (Signed)
Physical Therapy Evaluation Patient Details Name: [name] MRN: [id number] DOB: 1951-04-09 Today's Date: 08/04/2022  History of Present Illness  [name] is a 71 y.o. male with medical history significant of HIV/AIDS and tobacco use disorder presents the ED with a chief complaint of polyuria.  Patient reports that he had polyuria for 1 month.  He has no dysuria, no hematuria.  Patient reports he has no pain associated with this.  He has had no fevers.  Patient also denies chest pain, shortness of breath, palpitations, nausea, vomiting, diarrhea, constipation, pain anywhere.  He does report a decreased appetite.  Patient appears emaciated, but swears that he is eating regularly just a decreased amount.  Patient's speech is slurred and difficult to understand.  Girlfriend at bedside reports that this started last week.  Patient thinks it may have started 2 or 3 months ago.  He has been confused per girlfriend.  Just saying things that do not make sense.  It is reported that they cannot get patient out of bed today because of generalized weakness, the patient reports that he has not felt any generalized or asymmetric weakness.  Again this could be due to the confusion.  Patient has no other complaints at this time.   Clinical Impression  Patient demonstrates slow labored movement for sitting up at bedside, very unsteady on feet and unable to maintain standing balance without use of RW and requires much time and repeated verbal/tactile for completing transfers to chair possibly due to poor eye sight and mild confusion.  Patient demonstrates slow labored cadence with narrow base of support requiring Min/mod assist for safety and tolerated sitting up in chair after therapy - nursing staff aware.  Patient will benefit from continued skilled physical therapy in hospital and recommended venue below to increase strength, balance, endurance for safe ADLs and gait.        Recommendations for follow  up therapy are one component of a multi-disciplinary discharge planning process, led by the attending physician.  Recommendations may be updated based on patient status, additional functional criteria and insurance authorization.  Follow Up Recommendations Skilled nursing-short term rehab (<3 hours/day) Can patient physically be transported by private vehicle: Yes    Assistance Recommended at Discharge Intermittent Supervision/Assistance  Patient can return home with the following  A lot of help with bathing/dressing/bathroom;A lot of help with walking and/or transfers;Help with stairs or ramp for entrance;Assistance with cooking/housework    Equipment Recommendations Rolling walker (2 wheels)  Recommendations for Other Services       Functional Status Assessment Patient has had a recent decline in their functional status and demonstrates the ability to make significant improvements in function in a reasonable and predictable amount of time.     Precautions / Restrictions Precautions Precautions: Fall Restrictions Weight Bearing Restrictions: No      Mobility  Bed Mobility Overal bed mobility: Needs Assistance Bed Mobility: Supine to Sit     Supine to sit: Mod assist     General bed mobility comments: increased time, labored movement    Transfers Overall transfer level: Needs assistance Equipment used: None, Rolling walker (2 wheels) Transfers: Sit to/from Stand, Bed to chair/wheelchair/BSC Sit to Stand: Mod assist   Step pivot transfers: Mod assist       General transfer comment: very unsteady on feet and unable to maintain standing balance without using RW    Ambulation/Gait Ambulation/Gait assistance: Mod assist, Min assist Gait Distance (Feet): 25 Feet Assistive device: Rolling walker (  2 wheels) Gait Pattern/deviations: Decreased step length - right, Decreased step length - left, Decreased stride length, Trunk flexed Gait velocity: decreased     General  Gait Details: slow labored unsteady cadence with very short step/stride length requiring much time to make turns  Financial trader Rankin (Stroke Patients Only)       Balance Overall balance assessment: Needs assistance Sitting-balance support: Feet supported, No upper extremity supported Sitting balance-Leahy Scale: Fair Sitting balance - Comments: fair/good seated at EOB   Standing balance support: During functional activity, No upper extremity supported Standing balance-Leahy Scale: Poor Standing balance comment: fair/poor using RW                             Pertinent Vitals/Pain Pain Assessment Pain Assessment: No/denies pain    Home Living Family/patient expects to be discharged to:: Private residence Living Arrangements: Alone Available Help at Discharge: Friend(s);Available PRN/intermittently Type of Home: Apartment Home Access: Level entry       Home Layout: One level Home Equipment: None      Prior Function Prior Level of Function : Needs assist       Physical Assist : Mobility (physical);ADLs (physical)     Mobility Comments: household and short distanced community ambulator without AD, 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 ADLs Comments: assisted by girl friend for community ADLs     Hand Dominance        Extremity/Trunk Assessment   Upper Extremity Assessment Upper Extremity Assessment: Generalized weakness    Lower Extremity Assessment Lower Extremity Assessment: Generalized weakness    Cervical / Trunk Assessment Cervical / Trunk Assessment: Kyphotic  Communication   Communication: No difficulties  Cognition Arousal/Alertness: Awake/alert Behavior During Therapy: WFL for tasks assessed/performed Overall Cognitive Status: Within Functional Limits for tasks assessed                                 General Comments: requires repeated verbal/tactile cueing for completing most  functional task        General Comments      Exercises     Assessment/Plan    PT Assessment Patient needs continued PT services  PT Problem List Decreased strength;Decreased activity tolerance;Decreased balance;Decreased mobility       PT Treatment Interventions DME instruction;Gait training;Stair training;Functional mobility training;Therapeutic activities;Therapeutic exercise;Patient/family education;Balance training    PT Goals (Current goals can be found in the Care Plan section)  Acute Rehab PT Goals Patient Stated Goal: return home with friends to assist PT Goal Formulation: With patient Time For Goal Achievement: 08/18/22 Potential to Achieve Goals: Good    Frequency Min 3X/week     Co-evaluation               AM-PAC PT "6 Clicks" Mobility  Outcome Measure Help needed turning from your back to your side while in a flat bed without using bedrails?: A Little Help needed moving from lying on your back to sitting on the side of a flat bed without using bedrails?: A Lot Help needed moving to and from a bed to a chair (including a wheelchair)?: A Lot Help needed standing up from a chair using your arms (e.g., wheelchair or bedside chair)?: A Lot Help needed to walk in hospital room?: A Lot Help needed climbing 3-5 steps with  a railing? : A Lot 6 Click Score: 13    End of Session   Activity Tolerance: Patient tolerated treatment well;Patient limited by fatigue Patient left: in chair;with call bell/phone within reach Nurse Communication: Mobility status PT Visit Diagnosis: Unsteadiness on feet (R26.81);Other abnormalities of gait and mobility (R26.89);Muscle weakness (generalized) (M62.81)    Time: 1005-1030 PT Time Calculation (min) (ACUTE ONLY): 25 min   Charges:   PT Evaluation $PT Eval Moderate Complexity: 1 Mod PT Treatments $Therapeutic Activity: 23-37 mins        12:00 PM, 08/04/22 Lonell Grandchild, MPT Physical Therapist with [facility] [phone] office [phone] mobile phone

## 2022-08-04 NOTE — Assessment & Plan Note (Addendum)
-   UA indicative of UTI - Urine culture = [facility] - Patient was started on Cipro in the ED, but appreciate pharmacy researching if patient can tolerate cephalosporins - continue ceftriaxone>>finished 7 days 8/12

## 2022-08-04 NOTE — TOC Initial Note (Signed)
Transition of Care [facility]) - Initial/Assessment Note    Patient Details  Name: [name] MRN: [id number] Date of Birth: 1951/11/09  Transition of Care [facility]) CM/SW Contact:    Iona Beard, [place] Phone Number: 08/04/2022, 12:24 PM  Clinical Narrative:                 TOC updated that PT is recommending SNF at D/C. CSW spoke with pt about PT recommendation. Pt would prefer to go home but states he will go to SNF if needed. Pt states he would like to go to SNF in [place]. CSW completed Fl2 and PASRR. Pts referral sent to [facility] for review. TOC to follow.   Expected Discharge Plan: Skilled Nursing Facility Barriers to Discharge: Continued Medical Work up   Patient Goals and CMS Choice Patient states their goals for this hospitalization and ongoing recovery are:: go to SNF CMS Medicare.gov Compare Post Acute Care list provided to:: Patient Choice offered to / list presented to : Patient  Expected Discharge Plan and Services Expected Discharge Plan: [place] In-house Referral: Clinical Social Work Discharge Planning Services: CM Consult Post Acute Care Choice: [place] arrangements for the past 2 months: [place]                                      Prior Living Arrangements/Services Living arrangements for the past 2 months: Single Family Home Lives with:: Self Patient language and need for interpreter reviewed:: Yes Do you feel safe going back to the place where you live?: Yes      Need for Family Participation in Patient Care: Yes (Comment) Care giver support system in place?: Yes (comment)   Criminal Activity/Legal Involvement Pertinent to Current Situation/Hospitalization: No - Comment as needed  Activities of Daily Living      Permission Sought/Granted                  Emotional Assessment Appearance:: Appears stated age Attitude/Demeanor/Rapport: Engaged Affect (typically  observed): Accepting Orientation: : Oriented to Self, Oriented to Place, Oriented to  Time, Oriented to Situation Alcohol / Substance Use: Not Applicable Psych Involvement: No (comment)  Admission diagnosis:  Hypercalcemia [E83.52] Bladder stone [N21.0] Confusion [R41.0] Balance problem [R26.89] Failure to thrive in adult [R62.7] Urinary tract infection without hematuria, site unspecified [N39.0] Urinary incontinence, unspecified type [R32] Patient Active Problem List   Diagnosis Date Noted   Failure to thrive in adult 97/98/9211   Acute metabolic encephalopathy 94/17/4081   Hypercalcemia 08/04/2022   UTI (urinary tract infection) 08/04/2022   Dysarthria 08/04/2022   Urinary incontinence    Fracture, [place], right, closed 01/20/2020   Healthcare maintenance 06/17/2019   Tobacco use 06/17/2019   Infestation by bed bug 11/02/2018   Underweight 11/02/2018   ERECTILE DYSFUNCTION 04/13/2008   Unspecified glaucoma 08/19/2007   DENTAL CARIES 08/19/2007   AIDS (acquired immune deficiency syndrome) ([place]) 10/22/2006   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 [place] [address] Phone: [phone] Fax: [phone]  [place] [address] Phone: [phone] Fax: [phone]     Social  Determinants of Health (SDOH) Interventions    Readmission Risk Interventions     No data to display

## 2022-08-04 NOTE — Assessment & Plan Note (Addendum)
-   Generalized weakness and general deterioration over time -At this time UTI and hypercalcemia also contributing -TOC consult to advise on available resources for this patient -Continue hydration, encourage nutrient dense food options, PT/OT -check B12--927 -folate 4.5>>replete -TSH 1.352 -PT >>SNF -pt eats better when assisted as he is blind

## 2022-08-04 NOTE — Assessment & Plan Note (Addendum)
-  Dysarthria for one week -No associated difficulty swallowing, or asymmetric weakness -no numbness -likely secondary to hypercalcemia -ST consult appreciated>>dysphagia 2 diet with thin liquids -overall improved

## 2022-08-04 NOTE — Assessment & Plan Note (Addendum)
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    - calcium initially trended down after starting calcitonin and giving IV zometa (8/6) -calcium trending down again with increase IVF - work up suggests primary hyperparathyroidism - review of records shows he's been hypercalcemic since 12/16/2017 - appointment made with gen surgery, Dr. Harlow Asa for 09/12/22 at 1:50PM

## 2022-08-05 DIAGNOSIS — B20 Human immunodeficiency virus [HIV] disease: Secondary | ICD-10-CM | POA: Diagnosis not present

## 2022-08-05 DIAGNOSIS — R471 Dysarthria and anarthria: Secondary | ICD-10-CM | POA: Diagnosis not present

## 2022-08-05 DIAGNOSIS — G9341 Metabolic encephalopathy: Secondary | ICD-10-CM | POA: Diagnosis not present

## 2022-08-05 DIAGNOSIS — R627 Adult failure to thrive: Secondary | ICD-10-CM | POA: Diagnosis not present

## 2022-08-05 LAB — COMPREHENSIVE METABOLIC PANEL
ALT: 17 U/L (ref 0–44)
AST: 19 U/L (ref 15–41)
Albumin: 2.9 g/dL — ABNORMAL LOW (ref 3.5–5.0)
Alkaline Phosphatase: 48 U/L (ref 38–126)
Anion gap: 3 — ABNORMAL LOW (ref 5–15)
BUN: 12 mg/dL (ref 8–23)
CO2: 24 mmol/L (ref 22–32)
Calcium: 11.1 mg/dL — ABNORMAL HIGH (ref 8.9–10.3)
Chloride: 114 mmol/L — ABNORMAL HIGH (ref 98–111)
Creatinine, Ser: 1.14 mg/dL (ref 0.61–1.24)
GFR, Estimated: >60 mL/min (ref >60)
Glucose, Bld: 95 mg/dL (ref 70–99)
Potassium: 3.6 mmol/L (ref 3.5–5.1)
Sodium: 141 mmol/L (ref 135–145)
Total Bilirubin: 0.6 mg/dL (ref 0.3–1.2)
Total Protein: 6.6 g/dL (ref 6.5–8.1)

## 2022-08-05 LAB — HIV-1 RNA QUANT-NO REFLEX-BLD
HIV 1 RNA Quant: 38100 copies/mL
LOG10 HIV-1 RNA: 4.581 log10copy/mL

## 2022-08-05 LAB — GLUCOSE, CAPILLARY: Glucose-Capillary: 97 mg/dL (ref 70–99)

## 2022-08-05 LAB — CALCIUM, IONIZED: Calcium, Ionized, Serum: 7.5 mg/dL — ABNORMAL HIGH (ref 4.5–5.6)

## 2022-08-05 MED ORDER — POLYETHYLENE GLYCOL 3350 17 G PO PACK
17.0000 g | PACK | Freq: Two times a day (BID) | ORAL | Status: DC
  Administered 2022-08-05 (×2): 17 g via ORAL
  Filled 2022-08-05 (×3): qty 1

## 2022-08-05 NOTE — Hospital Course (Signed)
71 y.o. male with medical history significant of HIV/AIDS and tobacco use disorder presents the ED with a chief complaint of polyuria.  Patient reports that he had polyuria for 1 month.  He has no dysuria, no hematuria.  Patient reports he has no pain associated with this.  He has had no fevers.  Patient also denies chest pain, shortness of breath, palpitations, nausea, vomiting, diarrhea, constipation, pain anywhere.  He does report a decreased appetite.  Patient appears emaciated, but swears that he is eating regularly just a decreased amount.  Patient's speech is slurred and difficult to understand.  Girlfriend at bedside reports that this started last week.  Patient thinks it may have started 2 or 3 months ago.  He has been confused per girlfriend.  Just saying things that do not make sense.  It is reported that they cannot get patient out of bed today because of generalized weakness, the patient reports that he has not felt any generalized or asymmetric weakness.  Again this could be due to the confusion.  Patient has no other complaints at this time.   Patient is a current smoker, but declines nicotine patch at this time.  He does not drink, does not use illicit drugs, is not vaccinated for COVID.  Patient is full code.

## 2022-08-05 NOTE — Progress Notes (Signed)
PROGRESS NOTE   [name]  NOI:[id number] DOB: [birth year] DOA: 08/03/2022 PCP: Pcp, No   Chief Complaint  Patient presents with   Urinary Tract Infection   Level of care: Telemetry  Brief Admission History:   71 y.o. male with medical history significant of HIV/AIDS and tobacco use disorder presents the ED with a chief complaint of polyuria.  Patient reports that he had polyuria for 1 month.  He has no dysuria, no hematuria.  Patient reports he has no pain associated with this.  He has had no fevers.  Patient also denies chest pain, shortness of breath, palpitations, nausea, vomiting, diarrhea, constipation, pain anywhere.  He does report a decreased appetite.  Patient appears emaciated, but swears that he is eating regularly just a decreased amount.  Patient's speech is slurred and difficult to understand.  Girlfriend at bedside reports that this started last week.  Patient thinks it may have started 2 or 3 months ago.  He has been confused per girlfriend.  Just saying things that do not make sense.  It is reported that they cannot get patient out of bed today because of generalized weakness, the patient reports that he has not felt any generalized or asymmetric weakness.  Again this could be due to the confusion.  Patient has no other complaints at this time.   Patient is a current smoker, but declines nicotine patch at this time.  He does not drink, does not use illicit drugs, is not vaccinated for COVID.  Patient is full code.   Assessment and Plan: * Failure to thrive in adult - Generalized weakness and general deterioration over time -AIDS likely contributing -At this time UTI and hypercalcemia also contributing -TOC consult to advise on available resources for this patient -Continue hydration, encourage nutrient dense food options, PT/OT -he may not be a surgical candidate for parathyroid surgery given his co-morbidities, severe deconditioning and advanced age  Urinary  incontinence - treating UTI and he still has a large bladder stone that needs to be addressed outpatient per urology  Dysarthria -Dysarthria for one week -No associated difficulty swallowing, or asymmetric weakness -no numbness -likely secondary to hypercalcemia -ST consult -improving as calcium improves  UTI (urinary tract infection) - UA indicative of UTI - Urine culture pending - Patient was started on Cipro in the ED, but appreciate pharmacy researching if patient can tolerate cephalosporins - planning 3 doses of ceftriaxone   Hypercalcemia - Calcium is 13.1 -Parathyroid nodule on imaging - Check PTH and ionized calcium - order IV normal saline, start calcitonin x 4 doses, given IV zometa x 1 dose -- thyroid US suggesting parathyroid adenoma and suggested getting a parathyroid nuclear scan which is pending.   - calcium is trending down after starting calcitonin and giving IV zometa and IV NS  Acute metabolic encephalopathy - Described as confusion -Likely secondary to hypercalcemia and UTI -CT head is without acute changes -improving as calcium comes down  AIDS (acquired immune deficiency syndrome) ([place]) - Infectious disease will be able to do a video consult. -pt reports that he has been out of meds x 4-5 months, having difficulty with transportation  -CD4 count pending and Viral load pending -restart home medication Symtuza -for some reason hasn't been given in last 2 days, reached out to pharmacy    DVT prophylaxis: sq heparin Code Status: Full  Family Communication:  Disposition: Status is: Inpatient Remains inpatient appropriate because: IV medications required, work up for hypercalcemia, parathyroid adenoma  Consultants:   Procedures:   Antimicrobials:    Subjective: Pt reports that he is starting to have more appetite.  Objective: Vitals:   08/04/22 2025 08/04/22 2100 08/05/22 0529 08/05/22 1207  BP:  115/71 118/75 125/80  Pulse: 100 99 95 97   Resp:  '17 18 20  '$ Temp:  98 F (36.7 C) 97.6 F (36.4 C) 97.9 F (36.6 C)  TempSrc:  Oral Oral Oral  SpO2:  94% 99% 97%  Weight:      Height:        Intake/Output Summary (Last 24 hours) at 08/05/2022 1236 Last data filed at 08/05/2022 1100 Gross per 24 hour  Intake 1994.95 ml  Output 4750 ml  Net -2755.05 ml   Filed Weights   08/04/22 0500  Weight: 59.5 kg   Examination:  General exam: Appears calm and comfortable  Respiratory system: Clear to auscultation. Respiratory effort normal. Cardiovascular system: normal S1 & S2 heard. No JVD, murmurs, rubs, gallops or clicks. No pedal edema. Gastrointestinal system: Abdomen is nondistended, soft and nontender. No organomegaly or masses felt. Normal bowel sounds heard. Central nervous system: Alert and oriented. No focal neurological deficits. Extremities: Symmetric 5 x 5 power. Skin: No rashes, lesions or ulcers. Psychiatry: Judgement and insight appear normal. Mood & affect appropriate.   Data Reviewed: I have personally reviewed following labs and imaging studies  CBC: Recent Labs  Lab 08/03/22 1945 08/04/22 0546  WBC 3.6* 2.9*  NEUTROABS 1.8 1.3*  HGB 14.9 14.0  HCT 46.8 44.1  MCV 91.1 91.9  PLT 85* 83*    Basic Metabolic Panel: Recent Labs  Lab 08/03/22 1945 08/04/22 0546 08/05/22 0446  NA 140 139 141  K 3.5 3.8 3.6  CL 109 107 114*  CO2 '29 29 24  '$ GLUCOSE 100* 85 95  BUN '17 14 12  '$ CREATININE 1.59* 1.39* 1.14  CALCIUM 13.1* 12.7* 11.1*  MG  --  1.8  --     CBG: Recent Labs  Lab 08/05/22 0739  GLUCAP 97    Recent Results (from the past 240 hour(s))  Urine Culture     Status: Abnormal (Preliminary result)   Collection Time: 08/03/22  9:29 PM   Specimen: Urine, Clean Catch  Result Value Ref Range Status   Specimen Description   Final    URINE, CLEAN CATCH Performed at [facility], [address], [place], [place] [address]    Special Requests   Final    NONE Performed at [facility],  [address] [place], [place] [address]    Culture (A)  Final    >=100,000 COLONIES/mL GRAM NEGATIVE RODS SUSCEPTIBILITIES TO FOLLOW Performed at [facility] [address], [place], [place] [address]    Report Status PENDING  Incomplete     Radiology Studies: US THYROID  Result Date: 08/05/2022 CLINICAL DATA:  Thyroid nodule seen on CT EXAM: THYROID ULTRASOUND TECHNIQUE: Ultrasound examination of the thyroid gland and adjacent soft tissues was performed. COMPARISON:  CT chest abdomen pelvis 08/03/2022 FINDINGS: Parenchymal Echotexture: Mildly heterogenous Isthmus: 0.9 cm Right lobe: 4.8 x 4.4 x 1.7 cm Left lobe: 3.6 x 3.4 x 1.9 cm _________________________________________________________ Estimated total number of nodules >/= 1 cm: 1 Number of spongiform nodules >/=  2 cm not described below (TR1): 0 Number of mixed cystic and solid nodules >/= 1.5 cm not described below ([place]): 0 _________________________________________________________ Nodule # 1: Location: Right; inferior Maximum size: 2.1 cm; Other 2 dimensions: 1.8 x 1.4 cm Composition: solid/almost  completely solid (2) Echogenicity: hypoechoic (2) Shape: not taller-than-wide (0) Margins: ill-defined (0) Echogenic foci: none (0) ACR TI-RADS total points: 4. ACR TI-RADS risk category: TR4 (4-6 points). ACR TI-RADS recommendations: **Given size (>/= 1.5 cm) and appearance, fine needle aspiration of this moderately suspicious nodule should be considered based on TI-RADS criteria. IMPRESSION: Nodule 1 (TI-RADS 4), measuring 2.1 cm, located in the inferior right thyroid lobe, meets criteria for FNA. Alternatively this nodule could be a parathyroid adenoma given its posterior location. Please correlate with patient symptoms and laboratory workup for hyperparathyroidism. Nuclear medicine parathyroid scan may be obtained if clinically appropriate. The above is in keeping with the ACR TI-RADS recommendations - J Am Coll Radiol 2017;14:587-595.  Electronically Signed   By: Miachel Roux M.D.   On: 08/05/2022 08:11   CT HEAD WO CONTRAST (5MM)  Result Date: 08/04/2022 CLINICAL DATA:  Neuro deficit, acute, stroke suspected ?NPH incontinence/wobbly EXAM: CT HEAD WITHOUT CONTRAST TECHNIQUE: Contiguous axial images were obtained from the base of the skull through the vertex without intravenous contrast. RADIATION DOSE REDUCTION: This exam was performed according to the departmental dose-optimization program which includes automated exposure control, adjustment of the mA and/or kV according to patient size and/or use of iterative reconstruction technique. COMPARISON:  08/30/2004 FINDINGS: Brain: 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 There is atrophy and chronic small vessel disease changes. No acute intracranial abnormality. Specifically, no hemorrhage, hydrocephalus, mass lesion, acute infarction, or significant intracranial injury. Vascular: No hyperdense vessel or unexpected calcification. Skull: No acute calvarial abnormality. 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 Atrophy, chronic microvascular disease. No acute intracranial abnormality. Electronically Signed   By: Rolm Baptise M.D.   On: 08/04/2022 00:54   CT L-SPINE NO CHARGE  Result Date: 08/03/2022 CLINICAL DATA:  Prostate cancer, urinary incontinence EXAM: CT LUMBAR SPINE WITHOUT CONTRAST TECHNIQUE: Multidetector CT imaging of the lumbar spine was performed without intravenous contrast administration. Multiplanar CT image reconstructions were also generated. RADIATION DOSE REDUCTION: This exam was performed according to the departmental dose-optimization program which includes automated exposure control, adjustment of the mA and/or kV according to patient size and/or use of iterative reconstruction technique. COMPARISON:  None Available. FINDINGS: Segmentation: 5 lumbar type vertebrae. Alignment:  Normal. Vertebrae: The osseous structures are diffusely osteopenic. No acute fracture of the lumbar spine. Vertebral body height is preserved. No lytic or blastic bone lesion. Paraspinal and other soft tissues: The paraspinal soft tissues are unremarkable. Additional soft tissue findings are better described on accompanying CT examination of the chest, abdomen, and pelvis. Disc levels: Intervertebral disc heights are preserved. No significant canal stenosis. No significant neuroforaminal narrowing. IMPRESSION: No acute fracture or malalignment of the lumbar spine. Diffuse osteopenia. Electronically Signed   By: Fidela Salisbury M.D.   On: 08/03/2022 23:31   CT CHEST ABDOMEN PELVIS W CONTRAST  Result Date: 08/03/2022 CLINICAL DATA:  Progressive weakness, urinary incontinence, prostate cancer EXAM: CT CHEST, ABDOMEN, AND PELVIS WITH CONTRAST TECHNIQUE: Multidetector CT imaging of the chest, abdomen and pelvis was performed following the standard protocol during bolus administration of intravenous contrast. RADIATION DOSE REDUCTION: This exam was performed according to the departmental dose-optimization program which includes automated exposure control, adjustment of the mA and/or kV according to patient size and/or use of iterative reconstruction technique. CONTRAST:  135m OMNIPAQUE IOHEXOL 300 MG/ML  SOLN COMPARISON:  None Available. FINDINGS: CT CHEST FINDINGS Cardiovascular: No significant coronary artery calcification. Global cardiac size within normal limits. No  pericardial effusion. Central pulmonary arteries are of normal caliber. Mild atherosclerotic calcification within the thoracic aorta. No aortic aneurysm. Mediastinum/Nodes: 20 mm exophytic thyroid nodule versus parathyroid adenoma is seen adjacent to the a lower pole of the right thyroid gland., best seen on coronal image # 82/4. This would be better assessed with dedicated thyroid sonography, however, in the setting of significant comorbidities or  limited life expectancy, no follow-up recommended (ref: J Am Coll Radiol. 2015 Feb;12(2): 143-50).Shotty right axillary lymphadenopathy is nonspecific and may be reactive in nature. No frankly pathologic thoracic adenopathy identified. The esophagus is unremarkable. Lungs/Pleura: Severe emphysema with right apical bulla formation. Mild elevation of the left hemidiaphragm with adjacent left lower lobe rounded atelectasis. No superimposed confluent pulmonary infiltrate. No pneumothorax or pleural effusion. Musculoskeletal: Osseous structures appear diffusely osteopenic. No focal lytic or blastic bone lesions are seen. No acute bone abnormality. CT ABDOMEN PELVIS FINDINGS Hepatobiliary: No focal liver abnormality is seen. No gallstones, gallbladder wall thickening, or biliary dilatation. Pancreas: Unremarkable Spleen: Unremarkable Adrenals/Urinary Tract: The adrenal glands are unremarkable. The kidneys are normal in size and position. Multiple simple cortical cysts are seen within the kidneys bilaterally. No follow-up imaging is recommended for these lesions. No enhancing intrarenal mass identified. No hydronephrosis. No intrarenal or ureteral calculi. A 2.7 cm rounded bladder calculus is seen dependently within the bladder lumen. There is superimposed circumferential bladder wall thickening, more severe along the posterior wall which may relate to chronic infectious or inflammatory cystitis. The bladder is not distended. Stomach/Bowel: Moderate stool within the rectosigmoid colon without evidence of obstruction. The stomach, small bowel, and large bowel are otherwise unremarkable. No free intraperitoneal gas or fluid. Vascular/Lymphatic: Aortic atherosclerosis. No enlarged abdominal or pelvic lymph nodes. Reproductive: Mild prostatic enlargement. Other: No abdominal wall hernia Musculoskeletal: The osseous structures are diffusely osteopenic. No focal lytic or blastic bone lesions are identified. IMPRESSION: 1. No  acute intrathoracic or intra-abdominal pathology identified. No definite radiographic explanation for the patient's reported symptoms. 2. Severe emphysema with right apical bulla formation. 3. 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 Dedicated thyroid sonography and correlation with serum parathyroid hormone level and thyroid function test may be helpful for further evaluation. 4. 2.7 cm bladder calculus. Superimposed circumferential bladder wall thickening may relate to superimposed chronic infectious or inflammatory cystitis. Correlation with urinalysis and urine culture may be helpful. 5. Moderate stool within the rectosigmoid colon without evidence of obstruction. 6. Mild prostatic enlargement. Emphysema (ICD10-J43.9). Electronically Signed   By: Fidela Salisbury M.D.   On: 08/03/2022 23:22    Scheduled Meds:  calcitonin  4 Units/kg Intramuscular BID   Darunavir-Cobicistat-Emtricitabine-Tenofovir Alafenamide  1 tablet Oral Daily   feeding supplement  237 mL Oral TID BM   heparin  5,000 Units Subcutaneous Q8H   polyethylene glycol  17 g Oral BID   Continuous Infusions:  sodium chloride 70 mL/hr at 08/05/22 0615   cefTRIAXone (ROCEPHIN)  IV 1 g (08/05/22 1131)     LOS: 1 day   Time spent: 54 mins  Raman Featherston Wynetta Emery, MD How to contact the [facility] Attending or Consulting provider [place] or covering provider during after hours [place], for this patient?  Check the care team in [facility] and look for a) attending/consulting [facility] provider listed and b) the [facility] team listed Log into [email or web address] and use [place]'s universal password to access. If you do not have the password, please contact the hospital operator.  Locate the [facility] provider you are looking for under [facility] and page to a number that you can be directly reached. If you still have difficulty reaching the provider, please page the [facility] (Director on Call) for the  Hospitalists listed on amion for assistance.  08/05/2022, 12:36 PM

## 2022-08-05 NOTE — Assessment & Plan Note (Signed)
-   treating UTI and he still has a large bladder stone that needs to be addressed outpatient per urology

## 2022-08-05 NOTE — Evaluation (Signed)
Clinical/Bedside Swallow Evaluation Patient Details  Name: [name] MRN: [id number] Date of Birth: 06-16-1951  Today's Date: 08/05/2022 Time: SLP Start Time (ACUTE ONLY): 17 SLP Stop Time (ACUTE ONLY): 1401 SLP Time Calculation (min) (ACUTE ONLY): 26 min  Past Medical History:  Past Medical History:  Diagnosis Date   HIV (human immunodeficiency virus infection) ([place])    Past Surgical History:  Past Surgical History:  Procedure Laterality Date   HAND SURGERY     HPI:  [name] is a 71 y.o. male with medical history significant of HIV/AIDS and tobacco use disorder presents the ED with a chief complaint of polyuria.  Patient reports that he had polyuria for 1 month.  He has no dysuria, no hematuria.  Patient reports he has no pain associated with this.  He has had no fevers.  Patient also denies chest pain, shortness of breath, palpitations, nausea, vomiting, diarrhea, constipation, pain anywhere.  He does report a decreased appetite.  Patient appears emaciated, but swears that he is eating regularly just a decreased amount.  Patient's speech is slurred and difficult to understand.  Girlfriend at bedside reports that this started last week.  Patient thinks it may have started 2 or 3 months ago.  He has been confused per girlfriend.  Just saying things that do not make sense.  It is reported that they cannot get patient out of bed today because of generalized weakness, the patient reports that he has not felt any generalized or asymmetric weakness.  Again this could be due to the confusion.  Patient has no other complaints at this time. BSE requested.    Assessment / Plan / Recommendation  Clinical Impression  Clinical swallow evaluation completed at bedside. Pt is edentulous and had excess oral secretions along uvula and soft palate, which were easily cleared. Pt presents with dyarthria, however with cues to decrease rate, his speech is more intelligible. He sounds slightly  hypernasal at times, but denies food/liquid coming out of his nose during intake. Pt with visible muscule wasting around his face. He reports that he has not taken his HIV medications for several months because they are being delivered to a house in [place] and he cannot get them. He states that he mostly drinks Boost/Ensure at home and eats very little. He had trouble with self feeding due to poor vision (glaucoma per Pt report) and had labial spillage with cup sips of thin when SLP provided hand over hand assist. Pt with one episode of coughing with straw sips, but none further even when taking his medication. Pt consumed mashed solids ([place] in mashed potatoes) and declined regular textures due to edentulous status. Recommend D3/mech soft and thin liquids with standard aspiration and reflux precautions with feeder assist due to low vision. SLP will follow up x1 for diet tolerance and ongoing education as needed. Pt and RN in agreement with plan of care. SLP Visit Diagnosis: Dysphagia, unspecified (R13.10)    Aspiration Risk  Mild aspiration risk    Diet Recommendation Dysphagia 3 (Mech soft);Thin liquid   Liquid Administration via: Cup;Straw Medication Administration: Whole meds with liquid Supervision: Intermittent supervision to cue for compensatory strategies;Staff to assist with self feeding Compensations: Small sips/bites Postural Changes: Seated upright at 90 degrees;Remain upright for at least 30 minutes after po intake    Other  Recommendations Oral Care Recommendations: Staff/trained caregiver to provide oral care Other Recommendations: Clarify dietary restrictions    Recommendations for follow up therapy are one component of  a multi-disciplinary discharge planning process, led by the attending physician.  Recommendations may be updated based on patient status, additional functional criteria and insurance authorization.  Follow up Recommendations No SLP follow up       Assistance Recommended at Discharge Intermittent Supervision/Assistance  Functional Status Assessment Patient has had a recent decline in their functional status and demonstrates the ability to make significant improvements in function in a reasonable and predictable amount of time.  Frequency and Duration min 2x/week  1 week       Prognosis Prognosis for Safe Diet Advancement: Good      Swallow Study   General Date of Onset: 08/03/22 HPI: [name] is a 71 y.o. male with medical history significant of HIV/AIDS and tobacco use disorder presents the ED with a chief complaint of polyuria.  Patient reports that he had polyuria for 1 month.  He has no dysuria, no hematuria.  Patient reports he has no pain associated with this.  He has had no fevers.  Patient also denies chest pain, shortness of breath, palpitations, nausea, vomiting, diarrhea, constipation, pain anywhere.  He does report a decreased appetite.  Patient appears emaciated, but swears that he is eating regularly just a decreased amount.  Patient's speech is slurred and difficult to understand.  Girlfriend at bedside reports that this started last week.  Patient thinks it may have started 2 or 3 months ago.  He has been confused per girlfriend.  Just saying things that do not make sense.  It is reported that they cannot get patient out of bed today because of generalized weakness, the patient reports that he has not felt any generalized or asymmetric weakness.  Again this could be due to the confusion.  Patient has no other complaints at this time. BSE requested. Type of Study: Bedside Swallow Evaluation Previous Swallow Assessment: N/A Diet Prior to this Study: Regular;Thin liquids Temperature Spikes Noted: No Respiratory Status: Room air History of Recent Intubation: No Behavior/Cognition: Alert;Cooperative;Pleasant mood Oral Cavity Assessment: Excessive secretions Oral Care Completed by SLP: Yes Oral Cavity - Dentition:  Edentulous Vision: Impaired for self-feeding Self-Feeding Abilities: Needs assist Patient Positioning: Upright in bed Baseline Vocal Quality: Normal Volitional Cough: Strong Volitional Swallow: Able to elicit    Oral/Motor/Sensory Function Overall Oral Motor/Sensory Function: Within functional limits   Ice Chips Ice chips: Within functional limits Presentation: Spoon   Thin Liquid Thin Liquid: Impaired Presentation: Cup;Self Fed;Straw Oral Phase Impairments: Reduced labial seal Oral Phase Functional Implications: Left anterior spillage Pharyngeal  Phase Impairments: Cough - Immediate (cough x1 over course of trials)    Nectar Thick Nectar Thick Liquid: Not tested   Honey Thick Honey Thick Liquid: Not tested   Puree Puree: Within functional limits Presentation: Spoon   Solid     Solid: Impaired Presentation: Spoon Oral Phase Impairments: Impaired mastication Oral Phase Functional Implications: Prolonged oral transit;Impaired mastication     Thank you,  Genene Churn, [place]  Britlee Skolnik 08/05/2022,2:28 PM

## 2022-08-05 NOTE — Progress Notes (Signed)
Offered pt supper tray, pt refuses, states, "First off, I ain't hungry. And second, I don't like that crap like that. I don't eat noodles and I don't eat vegetables." Offered pt ice cream, pudding, applesauce, crackers and peanut butter and pt refuses all. Pt states, "Just give me an ensure and I'll be good. Ensure given per request.

## 2022-08-05 NOTE — TOC Progression Note (Signed)
Transition of Care [facility]) - Progression Note    Patient Details  Name: [name] MRN: [id number] Date of Birth: 06/09/1951  Transition of Care [facility]) CM/SW Contact  Shade Flood, LCSW Phone Number: 08/05/2022, 12:45 PM  Clinical Narrative:     TOC following. Spoke with pt at bedside today to update on SNF bed offer at [facility]. Pt accepts bed offer. Offered pt for TOC to call family and update but pt stated he would let his brother know.  MD anticipating dc in 2-3 days. TOC will start insurance auth for SNF closer to dc.  Updated Debbie at [facility]. TOC will follow.  Expected Discharge Plan: [place] Barriers to Discharge: Continued Medical Work up  Expected Discharge Plan and Services Expected Discharge Plan: [place] In-house Referral: Clinical Social Work Discharge Planning Services: CM Consult Post Acute Care Choice: [place] arrangements for the past 2 months: Single Family Home                                       Social Determinants of Health (SDOH) Interventions    Readmission Risk Interventions     No data to display

## 2022-08-06 ENCOUNTER — Inpatient Hospital Stay (HOSPITAL_COMMUNITY): Payer: Medicare Other

## 2022-08-06 DIAGNOSIS — D351 Benign neoplasm of parathyroid gland: Secondary | ICD-10-CM | POA: Diagnosis present

## 2022-08-06 DIAGNOSIS — R471 Dysarthria and anarthria: Secondary | ICD-10-CM | POA: Diagnosis not present

## 2022-08-06 DIAGNOSIS — B20 Human immunodeficiency virus [HIV] disease: Secondary | ICD-10-CM | POA: Diagnosis not present

## 2022-08-06 DIAGNOSIS — R627 Adult failure to thrive: Secondary | ICD-10-CM | POA: Diagnosis not present

## 2022-08-06 DIAGNOSIS — G9341 Metabolic encephalopathy: Secondary | ICD-10-CM | POA: Diagnosis not present

## 2022-08-06 LAB — BASIC METABOLIC PANEL
Anion gap: 5 (ref 5–15)
BUN: 23 mg/dL (ref 8–23)
CO2: 22 mmol/L (ref 22–32)
Calcium: 13.8 mg/dL (ref 8.9–10.3)
Chloride: 116 mmol/L — ABNORMAL HIGH (ref 98–111)
Creatinine, Ser: 1.4 mg/dL — ABNORMAL HIGH (ref 0.61–1.24)
GFR, Estimated: 53 mL/min — ABNORMAL LOW (ref >60)
Glucose, Bld: 123 mg/dL — ABNORMAL HIGH (ref 70–99)
Potassium: 3.8 mmol/L (ref 3.5–5.1)
Sodium: 143 mmol/L (ref 135–145)

## 2022-08-06 LAB — CD4/CD8 (T-HELPER/T-SUPPRESSOR CELL)
CD4 absolute: 238 /uL — ABNORMAL LOW (ref 400–1790)
CD4%: 18.89 % — ABNORMAL LOW (ref 33–65)
CD8 T Cell Abs: 364 /uL (ref 190–1000)
CD8tox: 28.8 % (ref 12–40)
Ratio: 0.66 — ABNORMAL LOW (ref 1.0–3.0)
Total lymphocyte count: 1262 /uL (ref 1000–4000)

## 2022-08-06 LAB — PTH, INTACT AND CALCIUM
Calcium, Total (PTH): 12.9 mg/dL — ABNORMAL HIGH (ref 8.6–10.2)
PTH: 71 pg/mL — ABNORMAL HIGH (ref 15–65)

## 2022-08-06 LAB — URINE CULTURE: Culture: >=100000 — AB

## 2022-08-06 LAB — MAGNESIUM: Magnesium: 2 mg/dL (ref 1.7–2.4)

## 2022-08-06 MED ORDER — TECHNETIUM TC 99M SESTAMIBI GENERIC - CARDIOLITE
25.0000 | Freq: Once | INTRAVENOUS | Status: AC | PRN
  Administered 2022-08-06: 27 via INTRAVENOUS

## 2022-08-06 MED ORDER — CALCITONIN (SALMON) 200 UNIT/ML IJ SOLN
4.0000 [IU]/kg | Freq: Two times a day (BID) | INTRAMUSCULAR | Status: AC
  Administered 2022-08-06 – 2022-08-07 (×4): 238 [IU] via INTRAMUSCULAR
  Filled 2022-08-06 (×4): qty 1.19

## 2022-08-06 MED ORDER — POLYETHYLENE GLYCOL 3350 17 G PO PACK
17.0000 g | PACK | Freq: Every day | ORAL | Status: DC
  Administered 2022-08-07 – 2022-08-09 (×3): 17 g via ORAL
  Filled 2022-08-06 (×4): qty 1

## 2022-08-06 MED ORDER — METOPROLOL TARTRATE 5 MG/5ML IV SOLN
2.5000 mg | Freq: Once | INTRAVENOUS | Status: AC
  Administered 2022-08-06: 2.5 mg via INTRAVENOUS
  Filled 2022-08-06: qty 5

## 2022-08-06 MED ORDER — FUROSEMIDE 10 MG/ML IJ SOLN
30.0000 mg | Freq: Once | INTRAMUSCULAR | Status: AC
  Administered 2022-08-06: 30 mg via INTRAVENOUS
  Filled 2022-08-06: qty 4

## 2022-08-06 MED ORDER — SODIUM CHLORIDE 0.9 % IV SOLN
1.0000 g | Freq: Once | INTRAVENOUS | Status: AC
  Administered 2022-08-06: 1 g via INTRAVENOUS
  Filled 2022-08-06: qty 10

## 2022-08-06 NOTE — Progress Notes (Signed)
   08/06/22 1642  Assess: MEWS Score  Temp 98.3 F (36.8 C)  BP 110/77  MAP (mmHg) 87  Pulse Rate (!) 132  ECG Heart Rate (!) 133  Resp (!) 22  Level of Consciousness Alert  SpO2 99 %  O2 Device Room Air  Assess: MEWS Score  MEWS Temp 0  MEWS Systolic 0  MEWS Pulse 3  MEWS RR 1  MEWS LOC 0  MEWS Score 4  MEWS Score Color Red  Assess: if the MEWS score is Yellow or Red  Were vital signs taken at a resting state? Yes  Focused Assessment No change from prior assessment  Does the patient meet 2 or more of the SIRS criteria? No  MEWS guidelines implemented *See Row Information* Yes  Treat  MEWS Interventions Escalated (See documentation below)  Pain Scale 0-10  Pain Score 0  Take Vital Signs  Increase Vital Sign Frequency  Red: Q 1hr X 4 then Q 4hr X 4, if remains red, continue Q 4hrs  Escalate  MEWS: Escalate Red: discuss with charge nurse/RN and provider, consider discussing with RRT  Notify: Charge Nurse/RN  Name of Charge Nurse/RN Notified Audrea Muscat  Date Charge Nurse/RN Notified 08/06/22  Time Charge Nurse/RN Notified 1648  Notify: Provider  Provider Name/Title Dr. Wynetta Emery  Date Provider Notified 08/06/22  Time Provider Notified 1648  Method of Notification Page  Notification Reason Other (Comment) (MEWS 4 (Red))  Provider response No new orders  Date of Provider Response 08/06/22  Time of Provider Response 1651  Assess: SIRS CRITERIA  SIRS Temperature  0  SIRS Pulse 1  SIRS Respirations  1  SIRS WBC 1  SIRS Score Sum  3   See orders from Dr. Wynetta Emery. EKG completed and MD at bedside.

## 2022-08-06 NOTE — Progress Notes (Signed)
   08/06/22 1359  Assess: MEWS Score  Temp 98.2 F (36.8 C)  BP (!) 140/101  MAP (mmHg) 113  Pulse Rate (!) 119  Resp 20  SpO2 100 %  O2 Device Room Air  Assess: MEWS Score  MEWS Temp 0  MEWS Systolic 0  MEWS Pulse 2  MEWS RR 0  MEWS LOC 0  MEWS Score 2  MEWS Score Color Yellow  Escalate  MEWS: Escalate Yellow: discuss with charge nurse/RN and consider discussing with provider and RRT  Notify: Charge Nurse/RN  Name of Charge Nurse/RN Notified [place]  Date Charge Nurse/RN Notified 08/06/22  Time Charge Nurse/RN Notified 2694  Notify: Provider  Provider Name/Title Dr Wynetta Emery  Date Provider Notified 08/06/22  Time Provider Notified 1400  Method of Notification Page  Notification Reason Other (Comment) (MEWS yellow score of 2)  Provider response See new orders  Date of Provider Response 08/06/22  Assess: SIRS CRITERIA  SIRS Temperature  0  SIRS Pulse 1  SIRS Respirations  0  SIRS WBC 1  SIRS Score Sum  2

## 2022-08-06 NOTE — Progress Notes (Signed)
PROGRESS NOTE   [name]  PPJ:[id number] DOB: December 04, 1951 DOA: 08/03/2022 PCP: Pcp, No   Chief Complaint  Patient presents with   Urinary Tract Infection   Level of care: Telemetry  Brief Admission History:   71 y.o. male with medical history significant of HIV/AIDS and tobacco use disorder presents the ED with a chief complaint of polyuria.  Patient reports that he had polyuria for 1 month.  He has no dysuria, no hematuria.  Patient reports he has no pain associated with this.  He has had no fevers.  Patient also denies chest pain, shortness of breath, palpitations, nausea, vomiting, diarrhea, constipation, pain anywhere.  He does report a decreased appetite.  Patient appears emaciated, but swears that he is eating regularly just a decreased amount.  Patient's speech is slurred and difficult to understand.  Girlfriend at bedside reports that this started last week.  Patient thinks it may have started 2 or 3 months ago.  He has been confused per girlfriend.  Just saying things that do not make sense.  It is reported that they cannot get patient out of bed today because of generalized weakness, the patient reports that he has not felt any generalized or asymmetric weakness.  Again this could be due to the confusion.  Patient has no other complaints at this time.   Patient is a current smoker, but declines nicotine patch at this time.  He does not drink, does not use illicit drugs, is not vaccinated for COVID.  Patient is full code.   Assessment and Plan: * Failure to thrive in adult - Generalized weakness and general deterioration over time -AIDS likely contributing -At this time UTI and hypercalcemia also contributing -TOC consult to advise on available resources for this patient -Continue hydration, encourage nutrient dense food options, PT/OT -he may not be a surgical candidate for parathyroid surgery given his co-morbidities, severe deconditioning and advanced age  Parathyroid  adenoma -- I discussed with general surgery Dr. Rosendo Gros with [facility] in [place], they reviewed the case and at this time they recommend that he have outpatient follow up with endocrinology.    -- work on improving calcium level -- 24 hour urine calcium ordered -- follow up PTH (still pending)  Urinary incontinence - treating UTI and he still has a large bladder stone that needs to be addressed outpatient per urology  Dysarthria -Dysarthria for one week -No associated difficulty swallowing, or asymmetric weakness -no numbness -likely secondary to hypercalcemia -ST consult appreciated  UTI (urinary tract infection) - UA indicative of UTI - Urine culture pending - Patient was started on Cipro in the ED, but appreciate pharmacy researching if patient can tolerate cephalosporins - planning 3 doses of ceftriaxone   Hypercalcemia - Calcium is 13.1 on admission - Parathyroid nodule noted on early imaging - PTH 71 (high) Ca 12.9 - suggests primary hyperparathyroidism - ordered IV normal saline, started calcitonin x 4 doses, given IV zometa x 1 dose -- thyroid US suggesting parathyroid adenoma and suggested getting a parathyroid nuclear scan which was completed and 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    - calcium initially trended down after starting calcitonin and giving IV zometa and IV NS but unfortunately now back up to 13.8.  - I have reached out to surgery.  I have ordered 4 more doses of calcitonin.  I am ordered loop diuretic lasix IV.   - 24 hour urine calcium ordered  Acute metabolic encephalopathy - Described as confusion -Likely secondary to hypercalcemia and  UTI -CT head is without acute changes -improving slowly with supportive care   AIDS (acquired immune deficiency syndrome) ([place]) -pt reported that he has been out of meds x 4-5 months, having difficulty with transportation  -CD4 count 238 and Viral load 38K -restarted home medication Symtuza   DVT prophylaxis: sq  heparin Code Status: Full  Family Communication:  Disposition: Status is: Inpatient Remains inpatient appropriate because: IV medications required, work up for hypercalcemia, parathyroid adenoma    Consultants:   Procedures:   Antimicrobials:    Subjective: Pt still only drinking Ensure. I'm not sure if he comprehends about his calcium   Objective: Vitals:   08/05/22 1207 08/05/22 2003 08/06/22 0508 08/06/22 1359  BP: 125/80 117/81 119/72 (!) 140/101  Pulse: 97 (!) 102 (!) 110 (!) 119  Resp: '20 20 20 20  '$ Temp: 97.9 F (36.6 C) 99.2 F (37.3 C) 98.6 F (37 C) 98.2 F (36.8 C)  TempSrc: Oral   Oral  SpO2: 97% 98% 99% 100%  Weight:      Height:        Intake/Output Summary (Last 24 hours) at 08/06/2022 1451 Last data filed at 08/06/2022 1300 Gross per 24 hour  Intake 3265.88 ml  Output 4700 ml  Net -1434.12 ml   Filed Weights   08/04/22 0500  Weight: 59.5 kg   Examination:  General exam: Appears calm and comfortable  Respiratory system: Clear to auscultation. Respiratory effort normal. Cardiovascular system: normal S1 & S2 heard. No JVD, murmurs, rubs, gallops or clicks. No pedal edema. Gastrointestinal system: Abdomen is nondistended, soft and nontender. No organomegaly or masses felt. Normal bowel sounds heard. Central nervous system: Alert and oriented. No focal neurological deficits. Extremities: Symmetric 5 x 5 power. Skin: No rashes, lesions or ulcers. Psychiatry: Judgement and insight appear normal. Mood & affect appropriate.   Data Reviewed: I have personally reviewed following labs and imaging studies  CBC: Recent Labs  Lab 08/03/22 1945 08/04/22 0546  WBC 3.6* 2.9*  NEUTROABS 1.8 1.3*  HGB 14.9 14.0  HCT 46.8 44.1  MCV 91.1 91.9  PLT 85* 83*    Basic Metabolic Panel: Recent Labs  Lab 08/03/22 1945 08/04/22 0546 08/05/22 0446 08/06/22 0808  NA 140 139 141 143  K 3.5 3.8 3.6 3.8  CL 109 107 114* 116*  CO2 '29 29 24 22  '$ GLUCOSE 100* 85 95  123*  BUN '17 14 12 23  '$ CREATININE 1.59* 1.39* 1.14 1.40*  CALCIUM 13.1* 12.7*  12.9* 11.1* 13.8*  MG  --  1.8  --  2.0    CBG: Recent Labs  Lab 08/05/22 0739  GLUCAP 97    Recent Results (from the past 240 hour(s))  Urine Culture     Status: Abnormal   Collection Time: 08/03/22  9:29 PM   Specimen: Urine, Clean Catch  Result Value Ref Range Status   Specimen Description   Final    URINE, CLEAN CATCH Performed at [facility], [address] [place], [place] [address]    Special Requests   Final    NONE Performed at [facility], [address] [place], [place] [address]    Culture >=100,000 COLONIES/mL PROTEUS MIRABILIS (A)  Final   Report Status 08/06/2022 FINAL  Final   Organism ID, Bacteria PROTEUS MIRABILIS (A)  Final      Susceptibility   Proteus mirabilis - MIC*    AMPICILLIN <=2 SENSITIVE Sensitive     CEFAZOLIN <=4 SENSITIVE Sensitive  CEFEPIME <=0.12 SENSITIVE Sensitive     CEFTRIAXONE <=0.25 SENSITIVE Sensitive     CIPROFLOXACIN <=0.25 SENSITIVE Sensitive     GENTAMICIN <=1 SENSITIVE Sensitive     IMIPENEM 2 SENSITIVE Sensitive     NITROFURANTOIN RESISTANT Resistant     TRIMETH/SULFA <=20 SENSITIVE Sensitive     AMPICILLIN/SULBACTAM <=2 SENSITIVE Sensitive     PIP/TAZO <=4 SENSITIVE Sensitive     * >=100,000 COLONIES/mL PROTEUS MIRABILIS     Radiology Studies: DG CHEST PORT 1 VIEW  Result Date: 08/06/2022 CLINICAL DATA:  Cough, tachycardia EXAM: PORTABLE CHEST 1 VIEW COMPARISON:  06/12/2007 chest radiograph. FINDINGS: Stable cardiomediastinal silhouette with normal heart size. No pneumothorax. No pleural effusion. A emphysema with bullous emphysema at the right lung apex. No pulmonary edema. No acute consolidative airspace disease. Mild curvilinear left lung base atelectasis. IMPRESSION: 1. Emphysema with bullous emphysema at the right lung apex. 2. Mild curvilinear left lung base atelectasis. Electronically Signed   By: Ilona Sorrel M.D.   On: 08/06/2022  14:33   NM Parathyroid W/Spect  Result Date: 08/06/2022 CLINICAL DATA:  Hypercalcemia. Posterior right paratracheal nodule on CT. EXAM: NM PARATHYROID SCINTIGRAPHY AND SPECT IMAGING TECHNIQUE: Following intravenous administration of radiopharmaceutical, early and 2-hour delayed planar images were obtained in the anterior projection. Delayed triplanar SPECT images were also obtained at 2 hours. RADIOPHARMACEUTICALS:  27.0 mCi Tc-74mSestamibi IV COMPARISON:  Thyroid ultrasound 08/04/2022.  Chest CT 08/03/2022. FINDINGS: Initial planar images demonstrate asymmetric increased uptake in the right paratracheal region. This persists on delayed plantar images. Delayed SPECT was performed through the neck and superior mediastinum in 3 planes. There is focal hypermetabolic activity in the inferior right paratracheal region, corresponding with the location of the nodule on CT and consistent with an inferior right parathyroid adenoma. IMPRESSION: Previously demonstrated right paratracheal nodule demonstrates persistent delayed sestamibi uptake and is consistent with a right parathyroid adenoma. Electronically Signed   By: WRichardean SaleM.D.   On: 08/06/2022 12:09    Scheduled Meds:  calcitonin  4 Units/kg Intramuscular BID   Darunavir-Cobicistat-Emtricitabine-Tenofovir Alafenamide  1 tablet Oral Daily   feeding supplement  237 mL Oral TID BM   heparin  5,000 Units Subcutaneous Q8H   [START ON 08/07/2022] polyethylene glycol  17 g Oral QHS   Continuous Infusions:  sodium chloride 100 mL/hr at 08/06/22 1132     LOS: 2 days   Time spent: 35 mins  Lalani Winkles JWynetta Emery MD How to contact the TSouth Pointe Surgical CenterAttending or Consulting provider [place] covering provider during after hours [place] for this patient?  Check the care team in CDigestive Disease Instituteand look for a) attending/consulting TRH provider listed and b) the TAscension St John Hospitalteam listed Log into [email or web address] and use [place]'s universal password to access. If you do not have the  password, please contact the hospital operator. Locate the TAdventist Medical Center - Reedleyprovider you are looking for under [facility] and page to a number that you can be directly reached. If you still have difficulty reaching the provider, please page the D[facility](Director on Call) for the Hospitalists listed on amion for assistance.  08/06/2022, 2:51 PM

## 2022-08-06 NOTE — Progress Notes (Signed)
08/06/2022 5:18 PM  Came to assess patient for tachycardia.  Pt asymptomatic.  EKG with sinus tachy.  Pt appears to have possibly aspirated.  Tech reports that with no teeth he has had a difficult time with current diet and has been choking.  SLP has been following him. I'm changing to dys 1 diet, nectar for now as I have noticed him choking on sips of water.  Placed aspiration precautions. Unfortunately patient not improving as we had hoped.  He is very ill.  I think it is appropriate for a palliative medicine consultation.  Will follow on 300 for now but if worsens would transfer to stepdown ICU.  T/C to brother B [place] and updated him about patient's condition and he verbalized understanding.   Murvin Natal, MD

## 2022-08-06 NOTE — Progress Notes (Signed)
Pt has been pleasant throughout this writers shift. No c/o pain or discomfort. IV fluids continued, no s/s of infiltration at IV site. Tolerating liquids well, as well as Ensure. Pt offered snack and declined. Will continue to monitor this pt.

## 2022-08-06 NOTE — Progress Notes (Signed)
Speech Language Pathology Treatment: Dysphagia  Patient Details Name: [name] MRN: [id number] DOB: June 22, 1951 Today's Date: 08/06/2022 Time: 6440-3474 SLP Time Calculation (min) (ACUTE ONLY): 29 min  Assessment / Plan / Recommendation Clinical Impression  Pt seen for ongoing dysphagia intervention following clinical swallow evaluation completed yesterday. RN reports that Pt has been coughing when drinking liquids, but seems to take his medication fine. Pt needs assist for feeding due to low vision. Oral care completed with toothbrush to coated tongue. Pt isn't really eating many solids and drinks mostly Ensure, so tongue doesn't get debridement from solids, so will need assist to brush his tongue. Pt does cough occasionally with liquids (both thin and nectars via straw today), however cough is strong and dry, voice is clear and strong. Current chest xray from today does not show acute changes and Pt is afebrile. SLP will continue to follow. He declined solid foods this session.    HPI HPI: [name] is a 71 y.o. male with medical history significant of HIV/AIDS and tobacco use disorder presents the ED with a chief complaint of polyuria.  Patient reports that he had polyuria for 1 month.  He has no dysuria, no hematuria.  Patient reports he has no pain associated with this.  He has had no fevers.  Patient also denies chest pain, shortness of breath, palpitations, nausea, vomiting, diarrhea, constipation, pain anywhere.  He does report a decreased appetite.  Patient appears emaciated, but swears that he is eating regularly just a decreased amount.  Patient's speech is slurred and difficult to understand.  Girlfriend at bedside reports that this started last week.  Patient thinks it may have started 2 or 3 months ago.  He has been confused per girlfriend.  Just saying things that do not make sense.  It is reported that they cannot get patient out of bed today because of generalized  weakness, the patient reports that he has not felt any generalized or asymmetric weakness.  Again this could be due to the confusion.  Patient has no other complaints at this time. BSE requested.      SLP Plan  Continue with current plan of care      Recommendations for follow up therapy are one component of a multi-disciplinary discharge planning process, led by the attending physician.  Recommendations may be updated based on patient status, additional functional criteria and insurance authorization.    Recommendations  Diet recommendations: Dysphagia 3 (mechanical soft);Thin liquid Liquids provided via: Straw;Cup Medication Administration: Whole meds with liquid Supervision: Staff to assist with self feeding Compensations: Small sips/bites Postural Changes and/or Swallow Maneuvers: Upright 30-60 min after meal;Seated upright 90 degrees                Oral Care Recommendations: Staff/trained caregiver to provide oral care Follow Up Recommendations:  (pending clinical course) Assistance recommended at discharge: Intermittent Supervision/Assistance SLP Visit Diagnosis: Dysphagia, unspecified (R13.10) Plan: Continue with current plan of care         Thank you,  Genene Churn, [place]   [place]  08/06/2022, 2:51 PM

## 2022-08-06 NOTE — Progress Notes (Deleted)
SLP Cancellation Note  Patient Details Name: KHAMARI SHEEHAN MRN: [id number] DOB: 1951-04-29   Cancelled treatment:       Reason Eval/Treat Not Completed: Fatigue/lethargy limiting ability to participate; Pt roused briefly to voice, however unable to sustain adequate alertness for lunch meal at this time. Nursing reports intermittent combativeness and lethargy today with minimal PO intake, but did take his medications crushed in puree. SLP will check back as schedule permits.  Thank you,  Genene Churn, [place]    [place] 08/06/2022, 12:46 PM

## 2022-08-06 NOTE — Assessment & Plan Note (Addendum)
--  Work up with elevated PTH and positive NM  Parathyroid scan consistent with primary hyperparathyroidism --appointment made with General Surgery Dr. Armandina Gemma for 09/12/22 at 1:50PM

## 2022-08-06 NOTE — Progress Notes (Signed)
Physical Therapy Treatment Patient Details Name: [name] MRN: [id number] DOB: [birth year] Today's Date: 08/06/2022   History of Present Illness [name] is a 71 y.o. male with medical history significant of HIV/AIDS and tobacco use disorder presents the ED with a chief complaint of polyuria.  Patient reports that he had polyuria for 1 month.  He has no dysuria, no hematuria.  Patient reports he has no pain associated with this.  He has had no fevers.  Patient also denies chest pain, shortness of breath, palpitations, nausea, vomiting, diarrhea, constipation, pain anywhere.  He does report a decreased appetite.  Patient appears emaciated, but swears that he is eating regularly just a decreased amount.  Patient's speech is slurred and difficult to understand.  Girlfriend at bedside reports that this started last week.  Patient thinks it may have started 2 or 3 months ago.  He has been confused per girlfriend.  Just saying things that do not make sense.  It is reported that they cannot get patient out of bed today because of generalized weakness, the patient reports that he has not felt any generalized or asymmetric weakness.  Again this could be due to the confusion.  Patient has no other complaints at this time.    PT Comments    Therapist attempted to get pt out of bed but the more the therapist attempted to encourage the pt to get out of bed the more irritated the pt became.     Recommendations for follow up therapy are one component of a multi-disciplinary discharge planning process, led by the attending physician.  Recommendations may be updated based on patient status, additional functional criteria and insurance authorization.  Follow Up Recommendations  Skilled nursing-short term rehab (<3 hours/day)     Assistance Recommended at Discharge    Patient can return home with the following A lot of help with bathing/dressing/bathroom;A lot of help with walking and/or  transfers;Help with stairs or ramp for entrance;Assistance with cooking/housework   Equipment Recommendations  Rolling walker (2 wheels)    Recommendations for Other Services  OT     Precautions / Restrictions Precautions Precautions: Fall     Mobility    Balance   Cognition Arousal/Alertness: Awake/alert Behavior During Therapy: WFL for tasks assessed/performed Overall Cognitive Status: Within Functional Limits for tasks assessed            Exercises General Exercises - Lower Extremity Ankle Circles/Pumps: AROM, 10 reps, Both, Supine Quad Sets: Both, 5 reps, Supine Heel Slides: Both, 10 reps, AAROM, Supine Hip ABduction/ADduction: Both, 10 reps, Supine    General Comments        Pertinent Vitals/Pain Pain Assessment Pain Assessment: No/denies pain    Home Living Family/patient expects to be discharged to:: Private residence Living Arrangements: Alone Available Help at Discharge: Friend(s);Available PRN/intermittently Type of Home: Apartment Home Access: Level entry       Home Layout: One level Home Equipment: None      Prior Function            PT Goals (current goals can now be found in the care plan section) Acute Rehab PT Goals Patient Stated Goal: return home with friends to assist PT Goal Formulation: With patient Time For Goal Achievement: 08/18/22 Potential to Achieve Goals: Fair    Frequency    Min 3X/week      PT Plan  Continue to encourage pt to get out of bed  End of Session   Activity Tolerance: Treatment limited secondary to agitation Patient left: in bed;with bed alarm set;with call bell/phone within reach Nurse Communication: Mobility status PT Visit Diagnosis: Unsteadiness on feet (R26.81);Other abnormalities of gait and mobility (R26.89);Muscle weakness (generalized) (M62.81)     Time: 1530-1540 PT Time Calculation (min) (ACUTE ONLY): 10 min  Charges:  $Therapeutic Exercise: 8-22 mins                     Rayetta Humphrey, PT CLT [phone]   08/06/2022, 3:43 PM

## 2022-08-07 ENCOUNTER — Inpatient Hospital Stay (HOSPITAL_COMMUNITY): Payer: Medicare Other

## 2022-08-07 DIAGNOSIS — R32 Unspecified urinary incontinence: Secondary | ICD-10-CM

## 2022-08-07 DIAGNOSIS — R410 Disorientation, unspecified: Secondary | ICD-10-CM

## 2022-08-07 DIAGNOSIS — N390 Urinary tract infection, site not specified: Secondary | ICD-10-CM

## 2022-08-07 DIAGNOSIS — D351 Benign neoplasm of parathyroid gland: Secondary | ICD-10-CM

## 2022-08-07 DIAGNOSIS — Z515 Encounter for palliative care: Secondary | ICD-10-CM

## 2022-08-07 DIAGNOSIS — Z7189 Other specified counseling: Secondary | ICD-10-CM

## 2022-08-07 DIAGNOSIS — N210 Calculus in bladder: Secondary | ICD-10-CM

## 2022-08-07 LAB — CBC
HCT: 41.9 % (ref 39.0–52.0)
Hemoglobin: 13.4 g/dL (ref 13.0–17.0)
MCH: 29.2 pg (ref 26.0–34.0)
MCHC: 32 g/dL (ref 30.0–36.0)
MCV: 91.3 fL (ref 80.0–100.0)
Platelets: 103 10*3/uL — ABNORMAL LOW (ref 150–400)
RBC: 4.59 MIL/uL (ref 4.22–5.81)
RDW: 12.2 % (ref 11.5–15.5)
WBC: 5.4 10*3/uL (ref 4.0–10.5)
nRBC: 0 % (ref 0.0–0.2)

## 2022-08-07 LAB — BASIC METABOLIC PANEL
Anion gap: 3 — ABNORMAL LOW (ref 5–15)
BUN: 20 mg/dL (ref 8–23)
CO2: 26 mmol/L (ref 22–32)
Calcium: 12.6 mg/dL — ABNORMAL HIGH (ref 8.9–10.3)
Chloride: 114 mmol/L — ABNORMAL HIGH (ref 98–111)
Creatinine, Ser: 1.37 mg/dL — ABNORMAL HIGH (ref 0.61–1.24)
GFR, Estimated: 55 mL/min — ABNORMAL LOW (ref >60)
Glucose, Bld: 92 mg/dL (ref 70–99)
Potassium: 3.8 mmol/L (ref 3.5–5.1)
Sodium: 143 mmol/L (ref 135–145)

## 2022-08-07 LAB — ALBUMIN: Albumin: 2.9 g/dL — ABNORMAL LOW (ref 3.5–5.0)

## 2022-08-07 LAB — VITAMIN D 25 HYDROXY (VIT D DEFICIENCY, FRACTURES): Vit D, 25-Hydroxy: 28.56 ng/mL — ABNORMAL LOW (ref 30–100)

## 2022-08-07 LAB — PHOSPHORUS: Phosphorus: 2 mg/dL — ABNORMAL LOW (ref 2.5–4.6)

## 2022-08-07 LAB — MAGNESIUM: Magnesium: 1.8 mg/dL (ref 1.7–2.4)

## 2022-08-07 MED ORDER — SODIUM CHLORIDE 0.9 % IV SOLN: INTRAVENOUS | Status: AC

## 2022-08-07 MED ORDER — SODIUM CHLORIDE 0.9 % IV SOLN
1.0000 g | INTRAVENOUS | Status: DC
  Administered 2022-08-07 – 2022-08-08 (×2): 1 g via INTRAVENOUS
  Filled 2022-08-07 (×3): qty 10

## 2022-08-07 NOTE — Progress Notes (Signed)
Speech Language Pathology Treatment: Dysphagia  Patient Details Name: [name] MRN: [id number] DOB: 1951/10/02 Today's Date: 08/07/2022 Time: 9622-2979 SLP Time Calculation (min) (ACUTE ONLY): 8 min  Assessment / Plan / Recommendation Clinical Impression  Acknowledge that MD changed diet to puree and NTL over night due to concerns about aspiration. Pt was noted to present with delayed cough with both thins and nectars yesterday, remained afebrile and chest xray clear. He eats very limited amounts of solid textures and primarily only wants to drink liquids. Will proceed with MBSS today. Pt accepted thin and NTL today with SLP with occasional delayed cough.   HPI HPI: [name] is a 71 y.o. male with medical history significant of HIV/AIDS and tobacco use disorder presents the ED with a chief complaint of polyuria.  Patient reports that he had polyuria for 1 month.  He has no dysuria, no hematuria.  Patient reports he has no pain associated with this.  He has had no fevers.  Patient also denies chest pain, shortness of breath, palpitations, nausea, vomiting, diarrhea, constipation, pain anywhere.  He does report a decreased appetite.  Patient appears emaciated, but swears that he is eating regularly just a decreased amount.  Patient's speech is slurred and difficult to understand.  Girlfriend at bedside reports that this started last week.  Patient thinks it may have started 2 or 3 months ago.  He has been confused per girlfriend.  Just saying things that do not make sense.  It is reported that they cannot get patient out of bed today because of generalized weakness, the patient reports that he has not felt any generalized or asymmetric weakness.  Again this could be due to the confusion.  Patient has no other complaints at this time. BSE requested.      SLP Plan  MBS      Recommendations for follow up therapy are one component of a multi-disciplinary discharge planning process, led  by the attending physician.  Recommendations may be updated based on patient status, additional functional criteria and insurance authorization.    Recommendations                   Plan: MBS         Thank you,  Genene Churn, [place]   [place]  08/07/2022, 10:31 AM

## 2022-08-07 NOTE — Consult Note (Signed)
Palliative Care Consult Note                                  Date: 08/07/2022   Patient Name: [name]  DOB: 03-Oct-1951  MRN: [id number]  Age / Sex: 71 y.o., male  PCP: Pcp, No Referring Physician: Orson Eva, MD  Reason for Consultation: Establishing goals of care  HPI/Patient Profile: 71 y.o. male  with past medical history of HIV/AIDS and tobacco use disorder presents the ED with a chief complaint of polyuria. He was admitted on 08/03/2022 with failure to thrive, parathyroid adenoma, dysarthria, hypercalcemia, acute metabolic encephalopathy, AIDS, and others.   PMT was consulted for goals of care conversations.  Past Medical History:  Diagnosis Date   HIV (human immunodeficiency virus infection) ([place])     Subjective:   This NP Walden Field reviewed medical records, received report from team, assessed the patient and then meet at the patient's bedside to discuss diagnosis, prognosis, GOC, EOL wishes disposition and options.  I met with the patient at the bedside.  No family was present   Concept of Palliative Care was introduced as specialized medical care for people and their families living with serious illness.  If focuses on providing relief from the symptoms and stress of a serious illness.  The goal is to improve quality of life for both the patient and the family. Values and goals of care important to patient and family were attempted to be elicited.  Created space and opportunity for patient  and family to explore thoughts and feelings regarding current medical situation   Natural trajectory and current clinical status were discussed. Questions and concerns addressed. Patient  encouraged to call with questions or concerns.    Patient/Family Understanding of Illness: The patient understands he is admitted because he "stopped eating and and pacing myself."  We discussed his current health situations including confusion  likely multifactorial in the setting of AIDS, high calcium, UTI currently being treated.  We discussed that his parathyroid gland has an adenoma that is causing his calcium to be very high and are trying to get this under control.  We also discussed that he was choking last night on food and may have gotten food into his lungs.  Of note, he denies difficulty breathing at this time.  Life Review: He is divorced for 20 years, has a girlfriend.  He previously lived with his girlfriend but now he lives alone in an apartment.  He loves TV.  He notes his brother has been making decisions for him.  Patient Values: Family, quality of life, girlfriend  Goals: To get back on his medications, get better, and get home  Today's Discussion: In addition to the discussions described above we had further discussions on various topics.  When I talked with the nurses she stated that the patient sis not eating much, mostly just Ensure.  When I talked to the patient he states he lives with his girlfriend.  He is feeling better but his appetite is "so-so".  He eats sometimes.  He states when he stopped his HIV medication he started losing weight.  Energy and weakness also because of stopping his medication.  Overall he feels he will get better now that he is back on his medications.  He states he has never had issues with swallowing and choking before, and he also attributes this to stopping his medication.  He seems  quite insistent on this point.  I assured him that his medications have been restarted while he was in the hospital.  He states he will be doing better [place].  We had discussion about CODE STATUS.  He seems pretty adamant about being a full code.  We discussed outpatient palliative care as a service to follow along due to his multiple chronic illnesses and help support him as he tries to manage his illnesses and help with distress associated with chronic illness.  He is in agreement.  I provided emotional and  general support through therapeutic listening, empathy, sharing of stories, and other techniques. I answered all questions and addressed all concerns to the best of my ability.   Review of Systems  Constitutional:  Positive for appetite change.  Gastrointestinal:  Negative for abdominal pain, nausea and vomiting.    Objective:   Primary Diagnoses: Present on Admission:  Failure to thrive in adult  AIDS (acquired immune deficiency syndrome) ([place])  Acute metabolic encephalopathy  Hypercalcemia  UTI (urinary tract infection)  Dysarthria  Urinary incontinence  Parathyroid adenoma   Physical Exam Vitals and nursing note reviewed.  Constitutional:      Appearance: He is cachectic. He is ill-appearing.  HENT:     Head: Normocephalic and atraumatic.  Cardiovascular:     Rate and Rhythm: Normal rate.  Pulmonary:     Effort: Pulmonary effort is normal. No respiratory distress.  Abdominal:     General: Abdomen is flat. Bowel sounds are normal.     Palpations: Abdomen is soft.  Skin:    General: Skin is warm and dry.  Neurological:     General: No focal deficit present.  Psychiatric:        Mood and Affect: Mood normal.        Behavior: Behavior normal.     Vital Signs:  BP 106/75 (BP Location: Left Arm)   Pulse 91   Temp 98.1 F (36.7 C) (Oral)   Resp 20   Ht 5' 11"  (1.803 m)   Wt 59.5 kg   SpO2 100%   BMI 18.30 kg/m   Palliative Assessment/Data: 30-40%    Advanced Care Planning:   Primary Decision Maker: PATIENT  Code Status/Advance Care Planning: Full code  A discussion was had today regarding advanced directives. Concepts specific to code status, artifical feeding and hydration, continued IV antibiotics and rehospitalization was had.  The difference between a aggressive medical intervention path and a palliative comfort care path for this patient at this time was had.   Decisions/Changes to ACP: No changes today  Assessment & Plan:    Impression: 71 year old male with multiple chronic and acute comorbidities and health issues as described above.  Overall the patient feels the cause of all of his problems including poor appetite, weight loss, weakness is due to stopping his HIV medications.  While I believe HIV is contributing to his illness, I do not feel it is the primary antagonist.  He seems to have a bit of denial about his current health situation.  He has been restarted on his HIV medications and we will need time to see if this improves his health significantly.  He is pretty adamant about full CODE STATUS.  He has excepted outpatient palliative care.  Overall prognosis in the long-term is poor  SUMMARY OF RECOMMENDATIONS   Remain full code Continue full scope of treatment TOC referral for outpatient palliative care services Goals appear clear at this time We will follow peripherally for  any significant change Please contact us if there are any significant changes or needs  Symptom Management:  Per primary team PMT is available to assist as needed  Prognosis:  Unable to determine  Discharge Planning:  To Be Determined   Discussed with: Patient, medical team, nursing team, [facility] team    Thank you for allowing [place] to participate in the care of SEKAI GITLIN PMT will continue to support holistically.  Time Total: 95 min  Greater than 50%  of this time was spent counseling and coordinating care related to the above assessment and plan.  Signed by: Walden Field, NP Palliative Medicine Team  Team Phone # [phone] (Nights/Weekends)  08/07/2022, 3:16 PM

## 2022-08-07 NOTE — TOC Progression Note (Signed)
Transition of Care [facility]) - Progression Note    Patient Details  Name: [name] MRN: [id number] Date of Birth: Mar 23, 1951  [facility] CM/SW Contact  Shade Flood, LCSW Phone Number: 08/07/2022, 11:23 AM  Clinical Narrative:     TOC following. MD anticipating dc to SNF on Friday. Updated Debbie at [facility]. TOC will initiate insurance auth tomorrow.   At request of Dr. Carles Collet, [facility] scheduled pt for an evaluation with Dr. Harlow Asa at [facility] for parathyroid adenoma. Appointment information added to AVS.  TOC will follow.  Expected Discharge Plan: [place] Barriers to Discharge: Continued Medical Work up  Expected Discharge Plan and Services Expected Discharge Plan: [place] In-house Referral: Clinical Social Work Discharge Planning Services: CM Consult Post Acute Care Choice: [place] arrangements for the past 2 months: Single Family Home                                       Social Determinants of Health (SDOH) Interventions    Readmission Risk Interventions     No data to display

## 2022-08-07 NOTE — Progress Notes (Signed)
PROGRESS NOTE  [name] CZY:[id number] DOB: [birth year] DOA: 08/03/2022 PCP: Pcp, No  Brief History:   71 y.o. male with medical history significant of HIV/AIDS and tobacco use disorder presents the ED with a chief complaint of polyuria.  Patient reports that he had polyuria for 1 month.  He has no dysuria, no hematuria.  Patient reports he has no pain associated with this.  He has had no fevers.  Patient also denies chest pain, shortness of breath, palpitations, nausea, vomiting, diarrhea, constipation, pain anywhere.  He does report a decreased appetite.  Patient appears emaciated, but swears that he is eating regularly just a decreased amount.  Patient's speech is slurred and difficult to understand.  Girlfriend at bedside reports that this started last week.  Patient thinks it may have started 2 or 3 months ago.  He has been confused per girlfriend.  Just saying things that do not make sense.  It is reported that they cannot get patient out of bed today because of generalized weakness, the patient reports that he has not felt any generalized or asymmetric weakness.  Again this could be due to the confusion.  Patient has no other complaints at this time.   Patient is a current smoker, but declines nicotine patch at this time.  He does not drink, does not use illicit drugs, is not vaccinated for COVID.  Patient is full code.     Assessment and Plan: * Failure to thrive in adult - Generalized weakness and general deterioration over time -At this time UTI and hypercalcemia also contributing -TOC consult to advise on available resources for this patient -Continue hydration, encourage nutrient dense food options, PT/OT -check B12, folate -TSH 1.352 -PT >>SNF  Parathyroid adenoma --Work up with elevated PTH and positive NM  Parathyroid scan consistent with primary hyperparathyroidism --appointment made with Dr. Armandina Gemma for 09/12/22 at 1:50PM  Urinary incontinence -  treating UTI and he still has a large bladder stone that needs to be addressed outpatient per urology  Dysarthria -Dysarthria for one week -No associated difficulty swallowing, or asymmetric weakness -no numbness -likely secondary to hypercalcemia -ST consult appreciated -overall improved  UTI (urinary tract infection) - UA indicative of UTI - Urine culture = Proteus - Patient was started on Cipro in the ED, but appreciate pharmacy researching if patient can tolerate cephalosporins - continue ceftriaxone  Hypercalcemia - Calcium is 13.1 on admission - Parathyroid nodule noted on early imaging - PTH 71 (high) Ca 12.9 - suggests primary hyperparathyroidism - ordered IV normal saline, started calcitonin x 4 doses, given IV zometa 4 mg x 1 dose -- thyroid US suggesting parathyroid adenoma and suggested getting a parathyroid nuclear scan which was completed and 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    - calcium initially trended down after starting calcitonin and giving IV zometa and IV NS but unfortunately now back up to 13.8.  - work up suggests primary hyperparathyroidism - review of records shows he's been hypercalcemic since 12/16/2017 - appointment made with gen surgery, Dr. Harlow Asa for 09/12/22 at 0:93AT  Acute metabolic encephalopathy -Likely secondary to hypercalcemia and UTI -CT head is without acute changes -improving slowly with supportive care   AIDS (acquired immune deficiency syndrome) ([place]) -pt reported that he has been out of meds x 4-5 months, having difficulty with transportation  -CD4 count 238 and Viral load 38K -restarted home medication Symtuza    Family Communication:   no Family at  bedside  Consultants:  none  Code Status:  FULL   DVT Prophylaxis:  [place] Heparin   Procedures: As Listed in Progress Note Above  Antibiotics: Ceftriaxone 8/6>>      Subjective: Patient denies fevers, chills, headache, chest pain, dyspnea, nausea, vomiting, diarrhea,  abdominal pain, dysuria, hematuria, hematochezia, and melena.   Objective: Vitals:   08/06/22 1751 08/06/22 2129 08/07/22 0356 08/07/22 1330  BP: 100/79 115/68 121/72 106/75  Pulse: (!) 101 99 85 91  Resp: '14 20 20 20  '$ Temp:  97.6 F (36.4 C) 97.7 F (36.5 C) 98.1 F (36.7 C)  TempSrc:    Oral  SpO2: 99% 97% 98% 100%  Weight:      Height:        Intake/Output Summary (Last 24 hours) at 08/07/2022 1607 Last data filed at 08/07/2022 0900 Gross per 24 hour  Intake 1914.94 ml  Output 2700 ml  Net -785.06 ml   Weight change:  Exam:  General:  Pt is alert, follows commands appropriately, not in acute distress HEENT: No icterus, No thrush, No neck mass, [place]/AT Cardiovascular: RRR, S1/S2, no rubs, no gallops Respiratory: fine bibasilar rales. No wheeze Abdomen: Soft/+BS, non tender, non distended, no guarding Extremities: No edema, No lymphangitis, No petechiae, No rashes, no synovitis   Data Reviewed: I have personally reviewed following labs and imaging studies Basic Metabolic Panel: Recent Labs  Lab 08/03/22 1945 08/04/22 0546 08/05/22 0446 08/06/22 0808 08/07/22 0507  NA 140 139 141 143 143  K 3.5 3.8 3.6 3.8 3.8  CL 109 107 114* 116* 114*  CO2 '29 29 24 22 26  '$ GLUCOSE 100* 85 95 123* 92  BUN '17 14 12 23 20  '$ CREATININE 1.59* 1.39* 1.14 1.40* 1.37*  CALCIUM 13.1* 12.7*  12.9* 11.1* 13.8* 12.6*  MG  --  1.8  --  2.0 1.8  PHOS  --   --   --   --  2.0*   Liver Function Tests: Recent Labs  Lab 08/03/22 1945 08/04/22 0546 08/05/22 0446 08/07/22 0507  AST '20 21 19  '$ --   ALT '18 17 17  '$ --   ALKPHOS 51 51 48  --   BILITOT 0.7 0.8 0.6  --   PROT 7.3 7.2 6.6  --   ALBUMIN 3.3* 3.2* 2.9* 2.9*   No results for input(s): "LIPASE", "AMYLASE" in the last 168 hours. No results for input(s): "AMMONIA" in the last 168 hours. Coagulation Profile: No results for input(s): "INR", "PROTIME" in the last 168 hours. CBC: Recent Labs  Lab 08/03/22 1945 08/04/22 0546  08/07/22 0507  WBC 3.6* 2.9* 5.4  NEUTROABS 1.8 1.3*  --   HGB 14.9 14.0 13.4  HCT 46.8 44.1 41.9  MCV 91.1 91.9 91.3  PLT 85* 83* 103*   Cardiac Enzymes: No results for input(s): "CKTOTAL", "CKMB", "CKMBINDEX", "TROPONINI" in the last 168 hours. BNP: Invalid input(s): "POCBNP" CBG: Recent Labs  Lab 08/05/22 0739  GLUCAP 97   HbA1C: No results for input(s): "HGBA1C" in the last 72 hours. Urine analysis:    Component Value Date/Time   COLORURINE AMBER (A) 08/03/2022 2129   APPEARANCEUR TURBID (A) 08/03/2022 2129   LABSPEC 1.013 08/03/2022 2129   PHURINE 8.0 08/03/2022 2129   GLUCOSEU NEGATIVE 08/03/2022 2129   GLUCOSEU NEG mg/dL 04/14/2009 1932   HGBUR MODERATE (A) 08/03/2022 2129   BILIRUBINUR NEGATIVE 08/03/2022 2129   Mineral NEGATIVE 08/03/2022 2129   PROTEINUR 100 (A) 08/03/2022 2129   UROBILINOGEN 1 02/13/2012 1456  NITRITE POSITIVE (A) 08/03/2022 2129   LEUKOCYTESUR LARGE (A) 08/03/2022 2129   Sepsis Labs: '@LABRCNTIP'$ (procalcitonin:4,lacticidven:4) ) Recent Results (from the past 240 hour(s))  Urine Culture     Status: Abnormal   Collection Time: 08/03/22  9:29 PM   Specimen: Urine, Clean Catch  Result Value Ref Range Status   Specimen Description   Final    URINE, CLEAN CATCH Performed at [facility], [address], [place], [place] [address]    Special Requests   Final    NONE Performed at [facility], [address], [place],  [address]    Culture >=100,000 COLONIES/mL PROTEUS MIRABILIS (A)  Final   Report Status 08/06/2022 FINAL  Final   Organism ID, Bacteria PROTEUS MIRABILIS (A)  Final      Susceptibility   Proteus mirabilis - MIC*    AMPICILLIN <=2 SENSITIVE Sensitive     CEFAZOLIN <=4 SENSITIVE Sensitive     CEFEPIME <=0.12 SENSITIVE Sensitive     CEFTRIAXONE <=0.25 SENSITIVE Sensitive     CIPROFLOXACIN <=0.25 SENSITIVE Sensitive     GENTAMICIN <=1 SENSITIVE Sensitive     IMIPENEM 2 SENSITIVE Sensitive     NITROFURANTOIN  RESISTANT Resistant     TRIMETH/SULFA <=20 SENSITIVE Sensitive     AMPICILLIN/SULBACTAM <=2 SENSITIVE Sensitive     PIP/TAZO <=4 SENSITIVE Sensitive     * >=100,000 COLONIES/mL PROTEUS MIRABILIS     Scheduled Meds:  calcitonin  4 Units/kg Intramuscular BID   Darunavir-Cobicistat-Emtricitabine-Tenofovir Alafenamide  1 tablet Oral Daily   feeding supplement  237 mL Oral TID BM   heparin  5,000 Units Subcutaneous Q8H   polyethylene glycol  17 g Oral QHS   Continuous Infusions:  sodium chloride     cefTRIAXone (ROCEPHIN)  IV      Procedures/Studies: DG CHEST PORT 1 VIEW  Result Date: 08/06/2022 CLINICAL DATA:  Cough, tachycardia EXAM: PORTABLE CHEST 1 VIEW COMPARISON:  06/12/2007 chest radiograph. FINDINGS: Stable cardiomediastinal silhouette with normal heart size. No pneumothorax. No pleural effusion. A emphysema with bullous emphysema at the right lung apex. No pulmonary edema. No acute consolidative airspace disease. Mild curvilinear left lung base atelectasis. IMPRESSION: 1. Emphysema with bullous emphysema at the right lung apex. 2. Mild curvilinear left lung base atelectasis. Electronically Signed   By: Ilona Sorrel M.D.   On: 08/06/2022 14:33   NM Parathyroid W/Spect  Result Date: 08/06/2022 CLINICAL DATA:  Hypercalcemia. Posterior right paratracheal nodule on CT. EXAM: NM PARATHYROID SCINTIGRAPHY AND SPECT IMAGING TECHNIQUE: Following intravenous administration of radiopharmaceutical, early and 2-hour delayed planar images were obtained in the anterior projection. Delayed triplanar SPECT images were also obtained at 2 hours. RADIOPHARMACEUTICALS:  27.0 mCi Tc-65mSestamibi IV COMPARISON:  Thyroid ultrasound 08/04/2022.  Chest CT 08/03/2022. FINDINGS: Initial planar images demonstrate asymmetric increased uptake in the right paratracheal region. This persists on delayed plantar images. Delayed SPECT was performed through the neck and superior mediastinum in 3 planes. There is focal  hypermetabolic activity in the inferior right paratracheal region, corresponding with the location of the nodule on CT and consistent with an inferior right parathyroid adenoma. IMPRESSION: Previously demonstrated right paratracheal nodule demonstrates persistent delayed sestamibi uptake and is consistent with a right parathyroid adenoma. Electronically Signed   By: WRichardean SaleM.D.   On: 08/06/2022 12:09   [place]THYROID  Result Date: 08/05/2022 CLINICAL DATA:  Thyroid nodule seen on CT EXAM: THYROID ULTRASOUND TECHNIQUE: Ultrasound examination of the thyroid gland and adjacent soft tissues was performed. COMPARISON:  CT  chest abdomen pelvis 08/03/2022 FINDINGS: Parenchymal Echotexture: Mildly heterogenous Isthmus: 0.9 cm Right lobe: 4.8 x 4.4 x 1.7 cm Left lobe: 3.6 x 3.4 x 1.9 cm _________________________________________________________ Estimated total number of nodules >/= 1 cm: 1 Number of spongiform nodules >/=  2 cm not described below (TR1): 0 Number of mixed cystic and solid nodules >/= 1.5 cm not described below (TR2): 0 _________________________________________________________ Nodule # 1: Location: Right; inferior Maximum size: 2.1 cm; Other 2 dimensions: 1.8 x 1.4 cm Composition: solid/almost completely solid (2) Echogenicity: hypoechoic (2) Shape: not taller-than-wide (0) Margins: ill-defined (0) Echogenic foci: none (0) ACR TI-RADS total points: 4. ACR TI-RADS risk category: TR4 (4-6 points). ACR TI-RADS recommendations: **Given size (>/= 1.5 cm) and appearance, fine needle aspiration of this moderately suspicious nodule should be considered based on TI-RADS criteria. IMPRESSION: Nodule 1 (TI-RADS 4), measuring 2.1 cm, located in the inferior right thyroid lobe, meets criteria for FNA. Alternatively this nodule could be a parathyroid adenoma given its posterior location. Please correlate with patient symptoms and laboratory workup for hyperparathyroidism. Nuclear medicine parathyroid scan may be  obtained if clinically appropriate. The above is in keeping with the ACR TI-RADS recommendations - J Am Coll Radiol 2017;14:587-595. Electronically Signed   By: Miachel Roux M.D.   On: 08/05/2022 08:11   CT HEAD WO CONTRAST (5MM)  Result Date: 08/04/2022 CLINICAL DATA:  Neuro deficit, acute, stroke suspected ?NPH incontinence/wobbly EXAM: CT HEAD WITHOUT CONTRAST TECHNIQUE: Contiguous axial images were obtained from the base of the skull through the vertex without intravenous contrast. RADIATION DOSE REDUCTION: This exam was performed according to the departmental dose-optimization program which includes automated exposure control, adjustment of the mA and/or kV according to patient size and/or use of iterative reconstruction technique. COMPARISON:  08/30/2004 FINDINGS: Brain: 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 There is atrophy and chronic small vessel disease changes. No acute intracranial abnormality. Specifically, no hemorrhage, hydrocephalus, mass lesion, acute infarction, or significant intracranial injury. Vascular: No hyperdense vessel or unexpected calcification. Skull: No acute calvarial abnormality. 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 Atrophy, chronic microvascular disease. No acute intracranial abnormality. Electronically Signed   By: Rolm Baptise M.D.   On: 08/04/2022 00:54   CT L-SPINE NO CHARGE  Result Date: 08/03/2022 CLINICAL DATA:  Prostate cancer, urinary incontinence EXAM: CT LUMBAR SPINE WITHOUT CONTRAST TECHNIQUE: Multidetector CT imaging of the lumbar spine was performed without intravenous contrast administration. Multiplanar CT image reconstructions were also generated. RADIATION DOSE REDUCTION: This exam was performed according to the departmental dose-optimization program which includes automated exposure control, adjustment of the mA and/or kV according to patient size and/or use  of iterative reconstruction technique. COMPARISON:  None Available. FINDINGS: Segmentation: 5 lumbar type vertebrae. Alignment: Normal. Vertebrae: The osseous structures are diffusely osteopenic. No acute fracture of the lumbar spine. Vertebral body height is preserved. No lytic or blastic bone lesion. Paraspinal and other soft tissues: The paraspinal soft tissues are unremarkable. Additional soft tissue findings are better described on accompanying CT examination of the chest, abdomen, and pelvis. Disc levels: Intervertebral disc heights are preserved. No significant canal stenosis. No significant neuroforaminal narrowing. IMPRESSION: No acute fracture or malalignment of the lumbar spine. Diffuse osteopenia. Electronically Signed   By: Fidela Salisbury M.D.   On: 08/03/2022 23:31   CT CHEST ABDOMEN PELVIS W CONTRAST  Result Date: 08/03/2022 CLINICAL DATA:  Progressive weakness, urinary incontinence, prostate cancer EXAM: CT CHEST, ABDOMEN, AND PELVIS WITH CONTRAST  TECHNIQUE: Multidetector CT imaging of the chest, abdomen and pelvis was performed following the standard protocol during bolus administration of intravenous contrast. RADIATION DOSE REDUCTION: This exam was performed according to the departmental dose-optimization program which includes automated exposure control, adjustment of the mA and/or kV according to patient size and/or use of iterative reconstruction technique. CONTRAST:  139m OMNIPAQUE IOHEXOL 300 MG/ML  SOLN COMPARISON:  None Available. FINDINGS: CT CHEST FINDINGS Cardiovascular: No significant coronary artery calcification. Global cardiac size within normal limits. No pericardial effusion. Central pulmonary arteries are of normal caliber. Mild atherosclerotic calcification within the thoracic aorta. No aortic aneurysm. Mediastinum/Nodes: 20 mm exophytic thyroid nodule versus parathyroid adenoma is seen adjacent to the a lower pole of the right thyroid gland., best seen on coronal image #  82/4. This would be better assessed with dedicated thyroid sonography, however, in the setting of significant comorbidities or limited life expectancy, no follow-up recommended (ref: J Am Coll Radiol. 2015 Feb;12(2): 143-50).Shotty right axillary lymphadenopathy is nonspecific and may be reactive in nature. No frankly pathologic thoracic adenopathy identified. The esophagus is unremarkable. Lungs/Pleura: Severe emphysema with right apical bulla formation. Mild elevation of the left hemidiaphragm with adjacent left lower lobe rounded atelectasis. No superimposed confluent pulmonary infiltrate. No pneumothorax or pleural effusion. Musculoskeletal: Osseous structures appear diffusely osteopenic. No focal lytic or blastic bone lesions are seen. No acute bone abnormality. CT ABDOMEN PELVIS FINDINGS Hepatobiliary: No focal liver abnormality is seen. No gallstones, gallbladder wall thickening, or biliary dilatation. Pancreas: Unremarkable Spleen: Unremarkable Adrenals/Urinary Tract: The adrenal glands are unremarkable. The kidneys are normal in size and position. Multiple simple cortical cysts are seen within the kidneys bilaterally. No follow-up imaging is recommended for these lesions. No enhancing intrarenal mass identified. No hydronephrosis. No intrarenal or ureteral calculi. A 2.7 cm rounded bladder calculus is seen dependently within the bladder lumen. There is superimposed circumferential bladder wall thickening, more severe along the posterior wall which may relate to chronic infectious or inflammatory cystitis. The bladder is not distended. Stomach/Bowel: Moderate stool within the rectosigmoid colon without evidence of obstruction. The stomach, small bowel, and large bowel are otherwise unremarkable. No free intraperitoneal gas or fluid. Vascular/Lymphatic: Aortic atherosclerosis. No enlarged abdominal or pelvic lymph nodes. Reproductive: Mild prostatic enlargement. Other: No abdominal wall hernia  Musculoskeletal: The osseous structures are diffusely osteopenic. No focal lytic or blastic bone lesions are identified. IMPRESSION: 1. No acute intrathoracic or intra-abdominal pathology identified. No definite radiographic explanation for the patient's reported symptoms. 2. Severe emphysema with right apical bulla formation. 3. 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 Dedicated thyroid sonography and correlation with serum parathyroid hormone level and thyroid function test may be helpful for further evaluation. 4. 2.7 cm bladder calculus. Superimposed circumferential bladder wall thickening may relate to superimposed chronic infectious or inflammatory cystitis. Correlation with urinalysis and urine culture may be helpful. 5. Moderate stool within the rectosigmoid colon without evidence of obstruction. 6. Mild prostatic enlargement. Emphysema (ICD10-J43.9). Electronically Signed   By: AFidela SalisburyM.D.   On: 08/03/2022 23:22    DOrson Eva DO  [facility]  If 7PM-7AM, please contact night-coverage [email or web address] Password [email or web address] 08/07/2022, 4:07 PM   LOS: 3 days

## 2022-08-07 NOTE — Progress Notes (Addendum)
Patient has rested well tonight and has had large output from male wick. Patient is on 24 hour urine which ends later today.  Patient family member came by to check on patient ( patient brother currently ill himself).  Patient had no new complaints, vitals stable this shift.  Attempted to give patient water during night, patient did after drink.  Patient was placed on dysphagia diet on Tuesday.  Will advise next shift to monitor patient with med pass and meal times.

## 2022-08-07 NOTE — Progress Notes (Signed)
Modified Barium Swallow Progress Note  Patient Details  Name: [name] MRN: [id number] Date of Birth: 09-21-1951  Today's Date: 08/07/2022  Modified Barium Swallow completed.  Full report located under Chart Review in the Imaging Section.  Brief recommendations include the following:  Clinical Impression  Pt presents with moderate oropharyngeal dysphagia with suspected esophageal dysphagia. Oral phase is marked by edentulous status, reduced labial seal, and reduced oral control with reduced bolus cohesiveness with premature spillage of liquids. Pharyngeal swallow is characterized by premature spillage of liquids, adequate hyolaryngeal excursion, reduced tongue base retraction, reduced epiglottic deflection, and reduced pharyngeal pressure resulting in penetration of liquids before and during the swallow (aspiration in trace amounts of thins via tsp and when taking thins via straw with barium tablet), and significant residuals post swallow of liquids (valleculae, lateral channels, and pyriforms). Pt also exhibited pharyngoesophageal reflux of nectars back into the pyriforms which was then penetrated. While Pt was not observed to aspirate nectars and honey-thick liquids, these resulted in greater pharyngeal residue post swallow, which are likely to increase risk for penetration/aspiration. Pt did not achieve epiglottic deflection with any liquids presented, except when presented with sequential straw sips. Pt with best performance (complete epiglottic deflection and no pharyngeal residue) with puree; min vallecular residue with regular textures and second swallow cleared. Pt primarily subsides on liquids only at this time (Ensure). He has poor vision and difficulty with self feeding. Ideally, Pt would be able to stay on thin liquids (given potential increased negative outcomes for aspirating thickened liquids and perhaps diminished intake) and have someone help feed him D3/mech soft textures, given  that Pt has been afebrile and chest xray without acute changes. Pt aspirated trace amount of tsp presentation of thin which elicited strong coughing, but not removed. Aspiration after the swallow was observed but not witnessed (had completed esophageal sweep and returned to pharyngeal view and aspirate noted down anterior tracheal wall) when taking straw sips of thin with barium tablet. The barium tablet remained near LES briefly and eventually passed through to the stomach after puree wash. SLP will discuss with care team and Pt to review goals of care and Pt wishes. Of note, Pt with suspected parathyroid changes.   Swallow Evaluation Recommendations       SLP Diet Recommendations: Dysphagia 2 (Fine chop) solids;Thin liquid   Liquid Administration via: Straw   Medication Administration: Whole meds with puree   Supervision: Full assist for feeding;Staff to assist with self feeding   Compensations: Clear throat intermittently;Multiple dry swallows after each bite/sip   Postural Changes: Remain semi-upright after after feeds/meals (Comment);Seated upright at 90 degrees   Oral Care Recommendations: Oral care BID;Staff/trained caregiver to provide oral care   Other Recommendations: Clarify dietary restrictions   Thank you,  Genene Churn, [place] 08/07/2022,1:42 PM

## 2022-08-07 NOTE — Plan of Care (Signed)

## 2022-08-08 DIAGNOSIS — E870 Hyperosmolality and hypernatremia: Secondary | ICD-10-CM | POA: Diagnosis present

## 2022-08-08 LAB — BASIC METABOLIC PANEL
Anion gap: 0 — ABNORMAL LOW (ref 5–15)
BUN: 17 mg/dL (ref 8–23)
CO2: 27 mmol/L (ref 22–32)
Calcium: 12.7 mg/dL — ABNORMAL HIGH (ref 8.9–10.3)
Chloride: 120 mmol/L — ABNORMAL HIGH (ref 98–111)
Creatinine, Ser: 1.21 mg/dL (ref 0.61–1.24)
GFR, Estimated: >60 mL/min (ref >60)
Glucose, Bld: 105 mg/dL — ABNORMAL HIGH (ref 70–99)
Potassium: 4 mmol/L (ref 3.5–5.1)
Sodium: 147 mmol/L — ABNORMAL HIGH (ref 135–145)

## 2022-08-08 LAB — CALCIUM, URINE, 24 HOUR
Calcium, 24 hour urine: 562 mg/24 hr — ABNORMAL HIGH (ref 0–320)
Calcium, Ur: 17.3 mg/dL
Total Volume: 3250

## 2022-08-08 MED ORDER — DEXTROSE-NACL 5-0.45 % IV SOLN: INTRAVENOUS | Status: DC

## 2022-08-08 MED ORDER — ZOLEDRONIC ACID 4 MG/5ML IV CONC
4.0000 mg | Freq: Once | INTRAVENOUS | Status: DC
Start: 2022-08-08 — End: 2022-08-08

## 2022-08-08 NOTE — Progress Notes (Signed)
Daily Progress Note   Patient Name: [name]       Date: 08/08/2022 DOB: 07/24/1951  Age: 71 y.o. MRN#: [id number] Attending Physician: Orson Eva, MD Primary Care Physician: Pcp, No Admit Date: 08/03/2022 Length of Stay: 4 days  Reason for Consultation/Follow-up: Establishing goals of care  HPI/Patient Profile:  71 y.o. male  with past medical history of HIV/AIDS and tobacco use disorder presents the ED with a chief complaint of polyuria. He was admitted on 08/03/2022 with failure to thrive, parathyroid adenoma, dysarthria, hypercalcemia, acute metabolic encephalopathy, AIDS, and others.    PMT was consulted for goals of care conversations.  Subjective:   Subjective: Chart Reviewed. Updates received. Patient Assessed. Created space and opportunity for patient  and family to explore thoughts and feelings regarding current medical situation.  Today's Discussion: Today I met with the patient at the bedside.  We discussed how he is doing today.  He feels like he is doing okay.  His appetite remains "so-so".  I encouraged him to eat in order to gain strength and maintain his nutrition.  We discussed that he is on a special diet with softer foods to prevent food getting in his lungs (aspiration).  We also discussed current plan recommendations for SNF/rehab.  He states he is okay with this and is looking forward to going to rehab in order to attempt to get stronger.  I encouraged him to continue taking his medications as an outpatient to prevent worsening illness.  He verbalized understanding.  He denies any pain, nausea, vomiting, any other complaints at this time.  I provided emotional and general support through therapeutic listening, empathy, sharing of stories, and other techniques. I answered all questions and addressed all concerns to the best of my ability.   Review of Systems  Constitutional:  Positive for appetite change and fatigue.  Respiratory:  Negative for cough and  shortness of breath.   Gastrointestinal:  Negative for abdominal pain, nausea and vomiting.  Neurological:  Positive for weakness.    Objective:   Vital Signs:  BP 123/70 (BP Location: Left Arm)   Pulse 86   Temp 98.4 F (36.9 C)   Resp 18   Ht _0  (1.803 m)   Wt 59.5 kg   SpO2 99%   BMI 18.30 kg/m   Physical Exam: Physical Exam Vitals and nursing note reviewed.  Constitutional:      General: He is not in acute distress.    Appearance: He is cachectic. He is ill-appearing.  HENT:     Head: Normocephalic and atraumatic.  Cardiovascular:     Rate and Rhythm: Normal rate.  Pulmonary:     Effort: Pulmonary effort is normal. No respiratory distress.  Abdominal:     General: Abdomen is flat.     Palpations: Abdomen is soft.  Skin:    General: Skin is warm and dry.  Neurological:     General: No focal deficit present.  Psychiatric:        Mood and Affect: Mood normal.        Behavior: Behavior normal.     Palliative Assessment/Data: 30-40%   Assessment & Plan:   Impression: Present on Admission:  Failure to thrive in adult  AIDS (acquired immune deficiency syndrome) (HCC)  Acute metabolic encephalopathy  Hypercalcemia  UTI (urinary tract infection)  Dysarthria  Urinary incontinence  Parathyroid adenoma  71 year old male with multiple chronic and acute comorbidities and health issues as described above.  Overall the patient feels  the cause of all of his problems including poor appetite, weight loss, weakness is due to stopping his HIV medications.  While I believe HIV is contributing to his illness, I do not feel it is the primary antagonist.  He seems to have a bit of denial about his current health situation.  He has been restarted on his HIV medications and we will need time to see if this improves his health significantly.  He is pretty adamant about full CODE STATUS.  He has accepted outpatient palliative care.  Overall prognosis in the long-term is  poor  SUMMARY OF RECOMMENDATIONS   Remain full code Continue full scope of treatment Recommend outpatient palliative care (TOC consult yesterday) Goals appear clear at this time We will follow peripherally for any significant change Please contact us if there are any significant changes or needs  Symptom Management:  Per primary team PMT is available to assist as needed  Code Status: Full code  Prognosis: Unable to determine  Discharge Planning: To Be Determined  Discussed with: Patient, medical team, nursing team, [facility] team  Thank you for allowing [place] to participate in the care of LEXX MONTE PMT will continue to support holistically.  Billing based on MDM: High  Problems Addressed: One acute or chronic illness or injury that poses a threat to life or bodily function  Amount and/or Complexity of Data: Category 1:Review of prior external note(s) from each unique source and Review of the result(s) of each unique test and Category 3:Discussion of management or test interpretation with external physician/other qualified health care professional/appropriate source (not separately reported) (BMP today for electrolytes, kidney function [improved somewhat today]; outpatient infections disease notes)  Risks: N/A   Walden Field, NP Palliative Medicine Team  Team Phone # [phone] (Nights/Weekends)  08/28/2021, 8:17 AM

## 2022-08-08 NOTE — Progress Notes (Signed)
Physical Therapy Treatment Patient Details Name: [name] MRN: [id number] DOB: 1951-04-13 Today's Date: 08/08/2022   History of Present Illness [name] is a 71 y.o. male with medical history significant of HIV/AIDS and tobacco use disorder presents the ED with a chief complaint of polyuria.  Patient reports that he had polyuria for 1 month.  He has no dysuria, no hematuria.  Patient reports he has no pain associated with this.  He has had no fevers.  Patient also denies chest pain, shortness of breath, palpitations, nausea, vomiting, diarrhea, constipation, pain anywhere.  He does report a decreased appetite.  Patient appears emaciated, but swears that he is eating regularly just a decreased amount.  Patient's speech is slurred and difficult to understand.  Girlfriend at bedside reports that this started last week.  Patient thinks it may have started 2 or 3 months ago.  He has been confused per girlfriend.  Just saying things that do not make sense.  It is reported that they cannot get patient out of bed today because of generalized weakness, the patient reports that he has not felt any generalized or asymmetric weakness.  Again this could be due to the confusion.  Patient has no other complaints at this time.    PT Comments    Increased time with bed mobility and transfers, cueing required to assist with safe mechanics for standing.  Utilized RW for safety with gait, pt presents very unsteady on feet, gait mechanics limited to short shuffled mechanics, feel partially related to poor vision.  Attempted sit on chair at EOS, pt unable to find chair and was twisted with IV so returned to bed.  Pt unwilling to transfer to chair once sitting on EOB.  Pt left in bed with call bell within reach and bed alarm set.  RW aware of status.    Recommendations for follow up therapy are one component of a multi-disciplinary discharge planning process, led by the attending physician.  Recommendations  may be updated based on patient status, additional functional criteria and insurance authorization.  Follow Up Recommendations  Skilled nursing-short term rehab (<3 hours/day)     Assistance Recommended at Discharge    Patient can return home with the following     Equipment Recommendations       Recommendations for Other Services       Precautions / Restrictions Precautions Precautions: Fall Restrictions Weight Bearing Restrictions: No     Mobility  Bed Mobility Overal bed mobility: Modified Independent Bed Mobility: Supine to Sit           General bed mobility comments: slow labored movements, cueing for mechanics to improve I bed mobility    Transfers Overall transfer level: Modified independent Equipment used: Rolling walker (2 wheels) Transfers: Sit to/from Stand Sit to Stand: Mod assist           General transfer comment: Cueing for hand placement to assist wiht STS safely, use of RW for safety upon standing, pt unsteady of feet    Ambulation/Gait Ambulation/Gait assistance: Min assist, Mod assist Gait Distance (Feet): 25 Feet Assistive device: Rolling walker (2 wheels) Gait Pattern/deviations: Decreased step length - right, Decreased step length - left, Decreased stride length, Trunk flexed Gait velocity: decreased     General Gait Details: slow labored unsteady cadence with very short step/stride length requiring much time to make turns   Marine scientist  [place] (Stroke Patients Only)       Balance                                            Cognition Arousal/Alertness: Awake/alert Behavior During Therapy: WFL for tasks assessed/performed Overall Cognitive Status: Within Functional Limits for tasks assessed                                 General Comments: requires repeated verbal/tactile cueing for completing most functional task, increased tactile cueing  required due to poor vision        Exercises      General Comments        Pertinent Vitals/Pain Pain Assessment Pain Assessment: No/denies pain    Home Living                          Prior Function            PT Goals (current goals can now be found in the care plan section)      Frequency    Min 3X/week      PT Plan      Co-evaluation              AM-PAC PT "6 Clicks" Mobility   Outcome Measure  Help needed turning from your back to your side while in a flat bed without using bedrails?: A Little Help needed moving from lying on your back to sitting on the side of a flat bed without using bedrails?: A Little Help needed moving to and from a bed to a chair (including a wheelchair)?: A Little Help needed standing up from a chair using your arms (e.g., wheelchair or bedside chair)?: A Lot Help needed to walk in hospital room?: A Lot Help needed climbing 3-5 steps with a railing? : Total 6 Click Score: 14    End of Session Equipment Utilized During Treatment: Gait belt Activity Tolerance: Patient tolerated treatment well;Patient limited by fatigue Patient left: in bed;with bed alarm set;with call bell/phone within reach Nurse Communication: Mobility status PT Visit Diagnosis: Unsteadiness on feet (R26.81);Other abnormalities of gait and mobility (R26.89);Muscle weakness (generalized) (M62.81)     Time: 7782-4235 PT Time Calculation (min) (ACUTE ONLY): 30 min  Charges:  $Therapeutic Exercise: 23-37 mins                     Ihor Austin, LPTA/CLT; CBIS [phone]  Aldona Lento 08/08/2022, 10:58 AM

## 2022-08-08 NOTE — Progress Notes (Signed)
PROGRESS NOTE  [name] JYN:[id number] DOB: Apr 27, 1951 DOA: 08/03/2022 PCP: Pcp, No  Brief History:   71 y.o. male with medical history significant of HIV/AIDS and tobacco use disorder presents the ED with a chief complaint of polyuria.  Patient reports that he had polyuria for 1 month.  He has no dysuria, no hematuria.  Patient reports he has no pain associated with this.  He has had no fevers.  Patient also denies chest pain, shortness of breath, palpitations, nausea, vomiting, diarrhea, constipation, pain anywhere.  He does report a decreased appetite.  Patient appears emaciated, but swears that he is eating regularly just a decreased amount.  Patient's speech is slurred and difficult to understand.  Girlfriend at bedside reports that this started last week.  Patient thinks it may have started 2 or 3 months ago.  He has been confused per girlfriend.  Just saying things that do not make sense.  It is reported that they cannot get patient out of bed today because of generalized weakness, the patient reports that he has not felt any generalized or asymmetric weakness.  Again this could be due to the confusion.  Patient has no other complaints at this time.   Patient is a current smoker, but declines nicotine patch at this time.  He does not drink, does not use illicit drugs, is not vaccinated for COVID.  Patient is full code.     Assessment and Plan: * Failure to thrive in adult - Generalized weakness and general deterioration over time -At this time UTI and hypercalcemia also contributing -TOC consult to advise on available resources for this patient -Continue hydration, encourage nutrient dense food options, PT/OT -check B12, folate -TSH 1.352 -PT >>SNF  Parathyroid adenoma --Work up with elevated PTH and positive NM  Parathyroid scan consistent with primary hyperparathyroidism --appointment made with Dr. Armandina Gemma for 09/12/22 at 1:50PM  Urinary incontinence -  treating UTI and he still has a large bladder stone that needs to be addressed outpatient per urology  Dysarthria -Dysarthria for one week -No associated difficulty swallowing, or asymmetric weakness -no numbness -likely secondary to hypercalcemia -ST consult appreciated -overall improved  UTI (urinary tract infection) - UA indicative of UTI - Urine culture = Proteus - Patient was started on Cipro in the ED, but appreciate pharmacy researching if patient can tolerate cephalosporins - continue ceftriaxone  Hypercalcemia - Calcium is 13.1 on admission - Parathyroid nodule noted on early imaging - PTH 71 (high) Ca 12.9 - suggests primary hyperparathyroidism - ordered IV normal saline, started calcitonin x 4 doses, given IV zometa 4 mg x 1 dose -- thyroid US suggesting parathyroid adenoma and suggested getting a parathyroid nuclear scan which was completed and 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    - calcium initially trended down after starting calcitonin and giving IV zometa and IV NS but unfortunately now back up to 13.8.  - work up suggests primary hyperparathyroidism - review of records shows he's been hypercalcemic since 12/16/2017 - appointment made with gen surgery, Dr. Harlow Asa for 09/12/22 at 8:65HQ  Acute metabolic encephalopathy -Likely secondary to hypercalcemia and UTI -CT head is without acute changes -improving slowly with supportive care   AIDS (acquired immune deficiency syndrome) ([place]) -pt reported that he has been out of meds x 4-5 months, having difficulty with transportation  -CD4 count 238 and Viral load 38K -restarted home medication Symtuza -appointment made for [facility] for 08/14/22 at 2:30 pm  Family Communication:   no Family at bedside   Consultants:  none   Code Status:  FULL    DVT Prophylaxis:  [place] Heparin     Procedures: As Listed in Progress Note Above   Antibiotics: Ceftriaxone 8/6>>       Subjective: Patient denies fevers,  chills, headache, chest pain, dyspnea, nausea, vomiting, diarrhea, abdominal pain, dysuria, hematuria, hematochezia, and melena.   Objective: Vitals:   08/07/22 1330 08/07/22 2015 08/08/22 0441 08/08/22 1339  BP: 106/75 118/69 123/70 127/89  Pulse: 91 82 86 100  Resp: '20 18 18 20  '$ Temp: 98.1 F (36.7 C) 97.7 F (36.5 C) 98.4 F (36.9 C) 98.7 F (37.1 C)  TempSrc: Oral   Oral  SpO2: 100% 98% 99% 98%  Weight:      Height:        Intake/Output Summary (Last 24 hours) at 08/08/2022 1814 Last data filed at 08/08/2022 1300 Gross per 24 hour  Intake 2523 ml  Output 2950 ml  Net -427 ml   Weight change:  Exam:  General:  Pt is alert, follows commands appropriately, not in acute distress HEENT: No icterus, No thrush, No neck mass, [place]/AT Cardiovascular: RRR, S1/S2, no rubs, no gallops Respiratory: bibasilar rales. No wheeze Abdomen: Soft/+BS, non tender, non distended, no guarding Extremities: No edema, No lymphangitis, No petechiae, No rashes, no synovitis   Data Reviewed: I have personally reviewed following labs and imaging studies Basic Metabolic Panel: Recent Labs  Lab 08/04/22 0546 08/05/22 0446 08/06/22 0808 08/07/22 0507 08/08/22 0431  NA 139 141 143 143 147*  K 3.8 3.6 3.8 3.8 4.0  CL 107 114* 116* 114* 120*  CO2 '29 24 22 26 27  '$ GLUCOSE 85 95 123* 92 105*  BUN '14 12 23 20 17  '$ CREATININE 1.39* 1.14 1.40* 1.37* 1.21  CALCIUM 12.7*  12.9* 11.1* 13.8* 12.6* 12.7*  MG 1.8  --  2.0 1.8  --   PHOS  --   --   --  2.0*  --    Liver Function Tests: Recent Labs  Lab 08/03/22 1945 08/04/22 0546 08/05/22 0446 08/07/22 0507  AST '20 21 19  '$ --   ALT '18 17 17  '$ --   ALKPHOS 51 51 48  --   BILITOT 0.7 0.8 0.6  --   PROT 7.3 7.2 6.6  --   ALBUMIN 3.3* 3.2* 2.9* 2.9*   No results for input(s): "LIPASE", "AMYLASE" in the last 168 hours. No results for input(s): "AMMONIA" in the last 168 hours. Coagulation Profile: No results for input(s): "INR", "PROTIME" in the  last 168 hours. CBC: Recent Labs  Lab 08/03/22 1945 08/04/22 0546 08/07/22 0507  WBC 3.6* 2.9* 5.4  NEUTROABS 1.8 1.3*  --   HGB 14.9 14.0 13.4  HCT 46.8 44.1 41.9  MCV 91.1 91.9 91.3  PLT 85* 83* 103*   Cardiac Enzymes: No results for input(s): "CKTOTAL", "CKMB", "CKMBINDEX", "TROPONINI" in the last 168 hours. BNP: Invalid input(s): "POCBNP" CBG: Recent Labs  Lab 08/05/22 0739  GLUCAP 97   HbA1C: No results for input(s): "HGBA1C" in the last 72 hours. Urine analysis:    Component Value Date/Time   COLORURINE AMBER (A) 08/03/2022 2129   APPEARANCEUR TURBID (A) 08/03/2022 2129   LABSPEC 1.013 08/03/2022 2129   PHURINE 8.0 08/03/2022 2129   GLUCOSEU NEGATIVE 08/03/2022 2129   GLUCOSEU NEG mg/dL 04/14/2009 1932   HGBUR MODERATE (A) 08/03/2022 2129   BILIRUBINUR NEGATIVE 08/03/2022 2129   KETONESUR NEGATIVE  08/03/2022 2129   PROTEINUR 100 (A) 08/03/2022 2129   UROBILINOGEN 1 02/13/2012 1456   NITRITE POSITIVE (A) 08/03/2022 2129   LEUKOCYTESUR LARGE (A) 08/03/2022 2129   Sepsis Labs: '@LABRCNTIP'$ (procalcitonin:4,lacticidven:4) ) Recent Results (from the past 240 hour(s))  Urine Culture     Status: Abnormal   Collection Time: 08/03/22  9:29 PM   Specimen: Urine, Clean Catch  Result Value Ref Range Status   Specimen Description   Final    URINE, CLEAN CATCH Performed at [facility], [address] [place], [place] [address]    Special Requests   Final    NONE Performed at [facility], [address], [place], [place] [address]    Culture >=100,000 COLONIES/mL PROTEUS MIRABILIS (A)  Final   Report Status 08/06/2022 FINAL  Final   Organism ID, Bacteria PROTEUS MIRABILIS (A)  Final      Susceptibility   Proteus mirabilis - MIC*    AMPICILLIN <=2 SENSITIVE Sensitive     CEFAZOLIN <=4 SENSITIVE Sensitive     CEFEPIME <=0.12 SENSITIVE Sensitive     CEFTRIAXONE <=0.25 SENSITIVE Sensitive     CIPROFLOXACIN <=0.25 SENSITIVE Sensitive     GENTAMICIN <=1 SENSITIVE  Sensitive     IMIPENEM 2 SENSITIVE Sensitive     NITROFURANTOIN RESISTANT Resistant     TRIMETH/SULFA <=20 SENSITIVE Sensitive     AMPICILLIN/SULBACTAM <=2 SENSITIVE Sensitive     PIP/TAZO <=4 SENSITIVE Sensitive     * >=100,000 COLONIES/mL PROTEUS MIRABILIS     Scheduled Meds:  Darunavir-Cobicistat-Emtricitabine-Tenofovir Alafenamide  1 tablet Oral Daily   feeding supplement  237 mL Oral TID BM   heparin  5,000 Units Subcutaneous Q8H   polyethylene glycol  17 g Oral QHS   Continuous Infusions:  cefTRIAXone (ROCEPHIN)  IV 1 g (08/08/22 1706)   dextrose 5 % and 0.45% NaCl      Procedures/Studies: DG Swallowing Func-Speech Pathology  Result Date: 08/08/2022 Table formatting from the original result was not included. Objective Swallowing Evaluation: Type of Study: MBS-Modified Barium Swallow Study  Patient Details Name: [name] MRN: [id number] Date of Birth: 1951-03-02 Today's Date: 08/07/2022 Time: SLP Start Time (ACUTE ONLY): 1210 -SLP Stop Time (ACUTE ONLY): 1245 SLP Time Calculation (min) (ACUTE ONLY): 35 min Past Medical History: Past Medical History: Diagnosis Date  HIV (human immunodeficiency virus infection) ([place])  Past Surgical History: Past Surgical History: Procedure Laterality Date  HAND SURGERY   HPI: LUCIAN BASWELL is a 71 y.o. male with medical history significant of HIV/AIDS and tobacco use disorder presents the ED with a chief complaint of polyuria.  Patient reports that he had polyuria for 1 month.  He has no dysuria, no hematuria.  Patient reports he has no pain associated with this.  He has had no fevers.  Patient also denies chest pain, shortness of breath, palpitations, nausea, vomiting, diarrhea, constipation, pain anywhere.  He does report a decreased appetite.  Patient appears emaciated, but swears that he is eating regularly just a decreased amount.  Patient's speech is slurred and difficult to understand.  Girlfriend at bedside reports that this started last  week.  Patient thinks it may have started 2 or 3 months ago.  He has been confused per girlfriend.  Just saying things that do not make sense.  It is reported that they cannot get patient out of bed today because of generalized weakness, the patient reports that he has not felt any generalized or asymmetric weakness.  Again this could be due  to the confusion.  Patient has no other complaints at this time. BSE requested.  Subjective: "I don't like honey."  Recommendations for follow up therapy are one component of a multi-disciplinary discharge planning process, led by the attending physician.  Recommendations may be updated based on patient status, additional functional criteria and insurance authorization. Assessment / Plan / Recommendation   08/07/2022   1:00 PM Clinical Impressions Clinical Impression Pt presents with moderate oropharyngeal dysphagia with suspected esophageal dysphagia. Oral phase is marked by edentulous status, reduced labial seal, and reduced oral control with reduced bolus cohesiveness with premature spillage of liquids. Pharyngeal swallow is characterized by premature spillage of liquids, adequate hyolaryngeal excursion, reduced tongue base retraction, reduced epiglottic deflection, and reduced pharyngeal pressure resulting in penetration of liquids before and during the swallow (aspiration in trace amounts of thins via tsp and when taking thins via straw with barium tablet), and significant residuals post swallow of liquids (valleculae, lateral channels, and pyriforms). Pt also exhibited pharyngoesophageal reflux of nectars back into the pyriforms which was then penetrated. While Pt was not observed to aspirate nectars and honey-thick liquids, these resulted in greater pharyngeal residue post swallow, which are likely to increase risk for penetration/aspiration. Pt did not achieve epiglottic deflection with any liquids presented, except when presented with sequential straw sips. Pt with best  performance (complete epiglottic deflection and no pharyngeal residue) with puree; min vallecular residue with regular textures and second swallow cleared. Pt primarily subsides on liquids only at this time (Ens[place]). He has poor vision and difficulty with self feeding. Ideally, Pt would be able to stay on thin liquids (given potential increased negative outcomes for aspirating thickened liquids and perhaps diminished intake) and have someone help feed him D3/mech soft textures, given that Pt has been afebrile and chest xray without acute changes. Pt aspirated trace amount of tsp presentation of thin which elicited strong coughing, but not removed. Aspiration after the swallow was observed but not witnessed (had completed esophageal sweep and returned to pharyngeal view and aspirate noted down anterior tracheal wall) when taking straw sips of thin with barium tablet. The barium tablet remained near LES briefly and eventually passed through to the stomach after puree wash. SLP will discuss with care team and Pt to review goals of care and Pt wishes. Of note, Pt with suspected parathyroid changes. SLP Visit Diagnosis Dysphagia, oropharyngeal phase (R13.12) Impact on safety and function Mild aspiration risk;Moderate aspiration risk;Risk for inadequate nutrition/hydration     08/07/2022   1:00 PM Treatment Recommendations Treatment Recommendations Therapy as outlined in treatment plan below     08/07/2022   1:00 PM Prognosis Prognosis for Safe Diet Advancement Fair Barriers to Reach Goals Severity of deficits   08/07/2022   1:00 PM Diet Recommendations SLP Diet Recommendations Dysphagia 2 (Fine chop) solids;Thin liquid Liquid Administration via Straw Medication Administration Whole meds with puree Compensations Clear throat intermittently;Multiple dry swallows after each bite/sip Postural Changes Remain semi-upright after after feeds/meals (Comment);Seated upright at 90 degrees     08/07/2022   1:00 PM Other Recommendations Oral  Care Recommendations Oral care BID;Staff/trained caregiver to provide oral care Other Recommendations Clarify dietary restrictions Follow Up Recommendations Skilled nursing-short term rehab (<3 hours/day) Assistance recommended at discharge Intermittent Supervision/Assistance Functional Status Assessment Patient has had a recent decline in their functional status and demonstrates the ability to make significant improvements in function in a reasonable and predictable amount of time.   08/07/2022   1:00 PM Frequency and Duration  Speech  Therapy Frequency (ACUTE ONLY) min 2x/week Treatment Duration 1 week     08/07/2022   1:00 PM Oral Phase Oral Phase Impaired Oral - Thin Teaspoon Premature spillage;Decreased bolus cohesion;Lingual/palatal residue Oral - Regular Impaired mastication;Delayed oral transit;Lingual/palatal residue Oral - Pill Delayed oral transit Oral Phase - Comment lingual residuals noted, repeat swallows clear    08/07/2022   1:00 PM Pharyngeal Phase Pharyngeal Phase Impaired Pharyngeal- Honey Cup Delayed swallow initiation-vallecula;Reduced epiglottic inversion;Reduced tongue base retraction;Pharyngeal residue - pyriform;Pharyngeal residue - valleculae;Lateral channel residue Pharyngeal- Nectar Straw Delayed swallow initiation-vallecula;Reduced epiglottic inversion;Reduced tongue base retraction;Penetration/Aspiration during swallow;Pharyngeal residue - valleculae;Pharyngeal residue - pyriform;Lateral channel residue Pharyngeal Material enters airway, remains ABOVE vocal cords then ejected out Pharyngeal- Thin Teaspoon Penetration/Aspiration before swallow;Penetration/Apiration after swallow;Trace aspiration;Pharyngeal residue - valleculae;Reduced airway/laryngeal closure;Pharyngeal residue - pyriform;Lateral channel residue Pharyngeal Material enters airway, passes BELOW cords and not ejected out despite cough attempt by patient Pharyngeal- Thin Straw Delayed swallow initiation-vallecula;Reduced epiglottic  inversion;Reduced pharyngeal peristalsis;Reduced airway/laryngeal closure;Pharyngeal residue - valleculae;Pharyngeal residue - pyriform;Lateral channel residue;Penetration/Apiration after swallow;Penetration/Aspiration during swallow Pharyngeal Material enters airway, remains ABOVE vocal cords then ejected out Pharyngeal- Puree WFL Pharyngeal- Regular Pharyngeal residue - valleculae Pharyngeal- Pill --    08/07/2022   1:00 PM Cervical Esophageal Phase  Cervical Esophageal Phase Impaired Nectar Straw Prominent cricopharyngeal segment;Esophageal backflow into the pharynx Pill Reduced cricopharyngeal relaxation Cervical Esophageal Comment brief stasis of barium tablet near UES, PER of NTL Thank you, Genene Churn, [place] PORTER,DABNEY 08/07/2022, 1:01 PM                     DG CHEST PORT 1 VIEW  Result Date: 08/06/2022 CLINICAL DATA:  Cough, tachycardia EXAM: PORTABLE CHEST 1 VIEW COMPARISON:  06/12/2007 chest radiograph. FINDINGS: Stable cardiomediastinal silhouette with normal heart size. No pneumothorax. No pleural effusion. A emphysema with bullous emphysema at the right lung apex. No pulmonary edema. No acute consolidative airspace disease. Mild curvilinear left lung base atelectasis. IMPRESSION: 1. Emphysema with bullous emphysema at the right lung apex. 2. Mild curvilinear left lung base atelectasis. Electronically Signed   By: Ilona Sorrel M.D.   On: 08/06/2022 14:33   NM Parathyroid W/Spect  Result Date: 08/06/2022 CLINICAL DATA:  Hypercalcemia. Posterior right paratracheal nodule on CT. EXAM: NM PARATHYROID SCINTIGRAPHY AND SPECT IMAGING TECHNIQUE: Following intravenous administration of radiopharmaceutical, early and 2-hour delayed planar images were obtained in the anterior projection. Delayed triplanar SPECT images were also obtained at 2 hours. RADIOPHARMACEUTICALS:  27.0 mCi Tc-80mSestamibi IV COMPARISON:  Thyroid ultrasound 08/04/2022.  Chest CT 08/03/2022. FINDINGS: Initial planar  images demonstrate asymmetric increased uptake in the right paratracheal region. This persists on delayed plantar images. Delayed SPECT was performed through the neck and superior mediastinum in 3 planes. There is focal hypermetabolic activity in the inferior right paratracheal region, corresponding with the location of the nodule on CT and consistent with an inferior right parathyroid adenoma. IMPRESSION: Previously demonstrated right paratracheal nodule demonstrates persistent delayed sestamibi uptake and is consistent with a right parathyroid adenoma. Electronically Signed   By: WRichardean SaleM.D.   On: 08/06/2022 12:09   [place]YROID  Result Date: 08/05/2022 CLINICAL DATA:  Thyroid nodule seen on CT EXAM: THYROID ULTRASOUND TECHNIQUE: Ultrasound examination of the thyroid gland and adjacent soft tissues was performed. COMPARISON:  CT chest abdomen pelvis 08/03/2022 FINDINGS: Parenchymal Echotexture: Mildly heterogenous Isthmus: 0.9 cm Right lobe: 4.8 x 4.4 x 1.7 cm Left lobe: 3.6 x 3.4 x 1.9 cm _________________________________________________________ Estimated total number  of nodules >/= 1 cm: 1 Number of spongiform nodules >/=  2 cm not described below (TR1): 0 Number of mixed cystic and solid nodules >/= 1.5 cm not described below ([place]): 0 _________________________________________________________ Nodule # 1: Location: Right; inferior Maximum size: 2.1 cm; Other 2 dimensions: 1.8 x 1.4 cm Composition: solid/almost completely solid (2) Echogenicity: hypoechoic (2) Shape: not taller-than-wide (0) Margins: ill-defined (0) Echogenic foci: none (0) ACR TI-RADS total points: 4. ACR TI-RADS risk category: TR4 (4-6 points). ACR TI-RADS recommendations: **Given size (>/= 1.5 cm) and appearance, fine needle aspiration of this moderately suspicious nodule should be considered based on TI-RADS criteria. IMPRESSION: Nodule 1 (TI-RADS 4), measuring 2.1 cm, located in the inferior right thyroid lobe, meets criteria for  FNA. Alternatively this nodule could be a parathyroid adenoma given its posterior location. Please correlate with patient symptoms and laboratory workup for hyperparathyroidism. Nuclear medicine parathyroid scan may be obtained if clinically appropriate. The above is in keeping with the ACR TI-RADS recommendations - J Am Coll Radiol 2017;14:587-595. Electronically Signed   By: Miachel Roux M.D.   On: 08/05/2022 08:11   CT HEAD WO CONTRAST (5MM)  Result Date: 08/04/2022 CLINICAL DATA:  Neuro deficit, acute, stroke suspected ?NPH incontinence/wobbly EXAM: CT HEAD WITHOUT CONTRAST TECHNIQUE: Contiguous axial images were obtained from the base of the skull through the vertex without intravenous contrast. RADIATION DOSE REDUCTION: This exam was performed according to the departmental dose-optimization program which includes automated exposure control, adjustment of the mA and/or kV according to patient size and/or use of iterative reconstruction technique. COMPARISON:  08/30/2004 FINDINGS: Brain: 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 There is atrophy and chronic small vessel disease changes. No acute intracranial abnormality. Specifically, no hemorrhage, hydrocephalus, mass lesion, acute infarction, or significant intracranial injury. Vascular: No hyperdense vessel or unexpected calcification. Skull: No acute calvarial abnormality. 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 Atrophy, chronic microvascular disease. No acute intracranial abnormality. Electronically Signed   By: Rolm Baptise M.D.   On: 08/04/2022 00:54   CT L-SPINE NO CHARGE  Result Date: 08/03/2022 CLINICAL DATA:  Prostate cancer, urinary incontinence EXAM: CT LUMBAR SPINE WITHOUT CONTRAST TECHNIQUE: Multidetector CT imaging of the lumbar spine was performed without intravenous contrast administration. Multiplanar CT image reconstructions were also generated.  RADIATION DOSE REDUCTION: This exam was performed according to the departmental dose-optimization program which includes automated exposure control, adjustment of the mA and/or kV according to patient size and/or use of iterative reconstruction technique. COMPARISON:  None Available. FINDINGS: Segmentation: 5 lumbar type vertebrae. Alignment: Normal. Vertebrae: The osseous structures are diffusely osteopenic. No acute fracture of the lumbar spine. Vertebral body height is preserved. No lytic or blastic bone lesion. Paraspinal and other soft tissues: The paraspinal soft tissues are unremarkable. Additional soft tissue findings are better described on accompanying CT examination of the chest, abdomen, and pelvis. Disc levels: Intervertebral disc heights are preserved. No significant canal stenosis. No significant neuroforaminal narrowing. IMPRESSION: No acute fracture or malalignment of the lumbar spine. Diffuse osteopenia. Electronically Signed   By: Fidela Salisbury M.D.   On: 08/03/2022 23:31   CT CHEST ABDOMEN PELVIS W CONTRAST  Result Date: 08/03/2022 CLINICAL DATA:  Progressive weakness, urinary incontinence, prostate cancer EXAM: CT CHEST, ABDOMEN, AND PELVIS WITH CONTRAST TECHNIQUE: Multidetector CT imaging of the chest, abdomen and pelvis was performed following the standard protocol during bolus administration of intravenous contrast. RADIATION DOSE REDUCTION: This exam was performed according to the  departmental dose-optimization program which includes automated exposure control, adjustment of the mA and/or kV according to patient size and/or use of iterative reconstruction technique. CONTRAST:  147m OMNIPAQUE IOHEXOL 300 MG/ML  SOLN COMPARISON:  None Available. FINDINGS: CT CHEST FINDINGS Cardiovascular: No significant coronary artery calcification. Global cardiac size within normal limits. No pericardial effusion. Central pulmonary arteries are of normal caliber. Mild atherosclerotic calcification  within the thoracic aorta. No aortic aneurysm. Mediastinum/Nodes: 20 mm exophytic thyroid nodule versus parathyroid adenoma is seen adjacent to the a lower pole of the right thyroid gland., best seen on coronal image # 82/4. This would be better assessed with dedicated thyroid sonography, however, in the setting of significant comorbidities or limited life expectancy, no follow-up recommended (ref: J Am Coll Radiol. 2015 Feb;12(2): 143-50).Shotty right axillary lymphadenopathy is nonspecific and may be reactive in nature. No frankly pathologic thoracic adenopathy identified. The esophagus is unremarkable. Lungs/Pleura: Severe emphysema with right apical bulla formation. Mild elevation of the left hemidiaphragm with adjacent left lower lobe rounded atelectasis. No superimposed confluent pulmonary infiltrate. No pneumothorax or pleural effusion. Musculoskeletal: Osseous structures appear diffusely osteopenic. No focal lytic or blastic bone lesions are seen. No acute bone abnormality. CT ABDOMEN PELVIS FINDINGS Hepatobiliary: No focal liver abnormality is seen. No gallstones, gallbladder wall thickening, or biliary dilatation. Pancreas: Unremarkable Spleen: Unremarkable Adrenals/Urinary Tract: The adrenal glands are unremarkable. The kidneys are normal in size and position. Multiple simple cortical cysts are seen within the kidneys bilaterally. No follow-up imaging is recommended for these lesions. No enhancing intrarenal mass identified. No hydronephrosis. No intrarenal or ureteral calculi. A 2.7 cm rounded bladder calculus is seen dependently within the bladder lumen. There is superimposed circumferential bladder wall thickening, more severe along the posterior wall which may relate to chronic infectious or inflammatory cystitis. The bladder is not distended. Stomach/Bowel: Moderate stool within the rectosigmoid colon without evidence of obstruction. The stomach, small bowel, and large bowel are otherwise  unremarkable. No free intraperitoneal gas or fluid. Vascular/Lymphatic: Aortic atherosclerosis. No enlarged abdominal or pelvic lymph nodes. Reproductive: Mild prostatic enlargement. Other: No abdominal wall hernia Musculoskeletal: The osseous structures are diffusely osteopenic. No focal lytic or blastic bone lesions are identified. IMPRESSION: 1. No acute intrathoracic or intra-abdominal pathology identified. No definite radiographic explanation for the patient's reported symptoms. 2. Severe emphysema with right apical bulla formation. 3. 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 Dedicated thyroid sonography and correlation with serum parathyroid hormone level and thyroid function test may be helpful for further evaluation. 4. 2.7 cm bladder calculus. Superimposed circumferential bladder wall thickening may relate to superimposed chronic infectious or inflammatory cystitis. Correlation with urinalysis and urine culture may be helpful. 5. Moderate stool within the rectosigmoid colon without evidence of obstruction. 6. Mild prostatic enlargement. Emphysema (ICD10-J43.9). Electronically Signed   By: AFidela SalisburyM.D.   On: 08/03/2022 23:22    DOrson Eva DO  [facility]  If 7PM-7AM, please contact night-coverage [email or web address] Password [email or web address] 08/08/2022, 6:14 PM   LOS: 4 days

## 2022-08-08 NOTE — Progress Notes (Signed)
Speech Language Pathology Treatment: Dysphagia  Patient Details Name: [name] MRN: [id number] DOB: 1951-09-15 Today's Date: 08/08/2022 Time: 2536-6440 SLP Time Calculation (min) (ACUTE ONLY): 14 min  Assessment / Plan / Recommendation Clinical Impression  Pt seen at bedside for dysphagia treatment following MBSS completed yesterday. Pt acknowledged needing assistance with feeding due to visual deficits. He had both thin and NTL at bedside. Pt continues to primarily drink his calories (Ensure/Boost), however will eat foods if someone helps feed him. He presented with slight belching/gurgling post swallow followed by delayed cough, however vocal quality remained clear. MBSS completed yesterday indicated poor pyriform clearance and pooling after the swallow necessitating repeat swallows to clear (with thin, NTL, and honey-thick liquids). Pt also exhibited pharyngoesphageal reflux of nectars back into the pyriforms indicative of esophageal dysphagia. Will change diet to D2 and thin liquids with aspiration and reflux precautions and SLP to follow during acute stay. Can change Pt back to NTL clinically if seems indicated, however Pt felt to be at risk for aspirating thickened liquids after the swallow due to residuals. Above to RN.     HPI HPI: [name] is a 71 y.o. male with medical history significant of HIV/AIDS and tobacco use disorder presents the ED with a chief complaint of polyuria.  Patient reports that he had polyuria for 1 month.  He has no dysuria, no hematuria.  Patient reports he has no pain associated with this.  He has had no fevers.  Patient also denies chest pain, shortness of breath, palpitations, nausea, vomiting, diarrhea, constipation, pain anywhere.  He does report a decreased appetite.  Patient appears emaciated, but swears that he is eating regularly just a decreased amount.  Patient's speech is slurred and difficult to understand.  Girlfriend at bedside reports  that this started last week.  Patient thinks it may have started 2 or 3 months ago.  He has been confused per girlfriend.  Just saying things that do not make sense.  It is reported that they cannot get patient out of bed today because of generalized weakness, the patient reports that he has not felt any generalized or asymmetric weakness.  Again this could be due to the confusion.  Patient has no other complaints at this time. BSE requested.      SLP Plan  Continue with current plan of care      Recommendations for follow up therapy are one component of a multi-disciplinary discharge planning process, led by the attending physician.  Recommendations may be updated based on patient status, additional functional criteria and insurance authorization.    Recommendations  Diet recommendations: Dysphagia 2 (fine chop);Thin liquid Liquids provided via: Straw;Cup Medication Administration: Whole meds with puree Supervision: Full supervision/cueing for compensatory strategies;Staff to assist with self feeding Compensations: Clear throat intermittently;Multiple dry swallows after each bite/sip Postural Changes and/or Swallow Maneuvers: Upright 30-60 min after meal;Seated upright 90 degrees                Oral Care Recommendations: Staff/trained caregiver to provide oral care Follow Up Recommendations: Skilled nursing-short term rehab (<3 hours/day) Assistance recommended at discharge: Intermittent Supervision/Assistance SLP Visit Diagnosis: Dysphagia, oropharyngeal phase (R13.12) Plan: Continue with current plan of care         Thank you,  Genene Churn, [place]  [place]  08/08/2022, 4:04 PM

## 2022-08-08 NOTE — Assessment & Plan Note (Addendum)
Improved after IIVF

## 2022-08-09 LAB — COMPREHENSIVE METABOLIC PANEL
ALT: 18 U/L (ref 0–44)
ALT: 41 U/L (ref 0–44)
AST: 20 U/L (ref 15–41)
AST: 38 U/L (ref 15–41)
Albumin: 3.3 g/dL — ABNORMAL LOW (ref 3.5–5.0)
Albumin: 3 g/dL — ABNORMAL LOW (ref 3.5–5.0)
Alkaline Phosphatase: 51 U/L (ref 38–126)
Alkaline Phosphatase: 46 U/L (ref 38–126)
Anion gap: 2 — ABNORMAL LOW (ref 5–15)
Anion gap: 3 — ABNORMAL LOW (ref 5–15)
BUN: 17 mg/dL (ref 8–23)
BUN: 14 mg/dL (ref 8–23)
CO2: 29 mmol/L (ref 22–32)
CO2: 24 mmol/L (ref 22–32)
Calcium: 13.1 mg/dL (ref 8.9–10.3)
Calcium: 11.9 mg/dL — ABNORMAL HIGH (ref 8.9–10.3)
Chloride: 109 mmol/L (ref 98–111)
Chloride: 114 mmol/L — ABNORMAL HIGH (ref 98–111)
Creatinine, Ser: 1.59 mg/dL — ABNORMAL HIGH (ref 0.61–1.24)
Creatinine, Ser: 1.14 mg/dL (ref 0.61–1.24)
GFR, Estimated: 46 mL/min — ABNORMAL LOW (ref >60)
GFR, Estimated: >60 mL/min (ref >60)
Glucose, Bld: 100 mg/dL — ABNORMAL HIGH (ref 70–99)
Glucose, Bld: 100 mg/dL — ABNORMAL HIGH (ref 70–99)
Potassium: 3.5 mmol/L (ref 3.5–5.1)
Potassium: 3.9 mmol/L (ref 3.5–5.1)
Sodium: 140 mmol/L (ref 135–145)
Sodium: 141 mmol/L (ref 135–145)
Total Bilirubin: 0.7 mg/dL (ref 0.3–1.2)
Total Bilirubin: 0.4 mg/dL (ref 0.3–1.2)
Total Protein: 7.3 g/dL (ref 6.5–8.1)
Total Protein: 7.3 g/dL (ref 6.5–8.1)

## 2022-08-09 LAB — FOLATE: Folate: 4.5 ng/mL — ABNORMAL LOW (ref >5.9)

## 2022-08-09 LAB — VITAMIN B12: Vitamin B-12: 927 pg/mL — ABNORMAL HIGH (ref 180–914)

## 2022-08-09 MED ORDER — SODIUM CHLORIDE 0.9 % IV SOLN
1.0000 g | Freq: Once | INTRAVENOUS | Status: AC
  Administered 2022-08-09: 1 g via INTRAVENOUS

## 2022-08-09 NOTE — Progress Notes (Signed)
PROGRESS NOTE  [name] QMG:[id number] DOB: 03-Feb-1951 DOA: 08/03/2022 PCP: Pcp, No  Brief History:   71 y.o. male with medical history significant of HIV/AIDS and tobacco use disorder presents the ED with a chief complaint of polyuria.  Patient reports that he had polyuria for 1 month.  He has no dysuria, no hematuria.  Patient reports he has no pain associated with this.  He has had no fevers.  Patient also denies chest pain, shortness of breath, palpitations, nausea, vomiting, diarrhea, constipation, pain anywhere.  He does report a decreased appetite.  Patient appears emaciated, but swears that he is eating regularly just a decreased amount.  Patient's speech is slurred and difficult to understand.  Girlfriend at bedside reports that this started last week.  Patient thinks it may have started 2 or 3 months ago.  He has been confused per girlfriend.  Just saying things that do not make sense.  It is reported that they cannot get patient out of bed today because of generalized weakness, the patient reports that he has not felt any generalized or asymmetric weakness.  Again this could be due to the confusion.  Patient has no other complaints at this time.   Patient is a current smoker, but declines nicotine patch at this time.  He does not drink, does not use illicit drugs, is not vaccinated for COVID.  Patient is full code.   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 Armandina Gemma for 09/12/22 at 1:50PM  Hypernatremia Start hypotonic IVF  Urinary incontinence - treating UTI and he still has a large bladder stone that needs to be addressed outpatient per urology  Dysarthria -Dysarthria for one week -No associated difficulty swallowing, or asymmetric weakness -no numbness -likely secondary to hypercalcemia -ST consult appreciated>>dysphagia 2 diet with thin liquids -overall improved  UTI (urinary tract infection) - UA indicative of UTI - Urine culture = Proteus - Patient was started on Cipro in the ED, but appreciate pharmacy researching if patient can tolerate cephalosporins - continue ceftriaxone>>finished 6 days 8/11  Hypercalcemia - Calcium is 13.1 on admission - Parathyroid nodule noted on early imaging - PTH 71 (high) Ca 12.9 - suggests primary hyperparathyroidism - ordered IV normal saline, started calcitonin x 4 doses, given IV zometa 4 mg x 1 dose -- thyroid US suggesting parathyroid adenoma and suggested getting a parathyroid nuclear scan which was completed and 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    - calcium initially trended down after starting calcitonin and giving IV zometa (8/6) -calcium trending down again with increase IVF - work up suggests primary hyperparathyroidism - review of records shows he's been hypercalcemic since 12/16/2017 - appointment made with gen surgery, Dr. Harlow Asa for 09/12/22 at 3:26ZT  Acute metabolic encephalopathy -Likely secondary to hypercalcemia and UTI -CT head is without acute changes -improving slowly with supportive care and improving calcium  AIDS (acquired immune deficiency syndrome) ([place]) -pt reported that he has been out of meds x 4-5 months,  having difficulty with transportation  -CD4 count 238 and Viral load 38K -restarted home medication Symtuza -appointment made for [facility] for 08/14/22 at 2:30 pm   Family Communication:   no  Family at bedside   Consultants:  none   Code Status:  FULL    DVT Prophylaxis:  [place] Heparin     Procedures: As Listed in Progress Note Above   Antibiotics: Ceftriaxone 8/6>>        Subjective: Patient denies fevers, chills, headache, chest pain, dyspnea, nausea, vomiting, diarrhea, abdominal pain,    Objective: Vitals:   08/08/22 1339 08/08/22 2038 08/09/22 0341 08/09/22 1242  BP: 127/89 121/62 (!) 157/92 121/87  Pulse: 100 97 88 96  Resp: '20 18 18 20  '$ Temp: 98.7 F (37.1 C) 98.9 F (37.2 C) 97.8 F (36.6 C) 97.8 F (36.6 C)  TempSrc: Oral   Oral  SpO2: 98% 100% 100% 100%  Weight:      Height:        Intake/Output Summary (Last 24 hours) at 08/09/2022 1643 Last data filed at 08/09/2022 1534 Gross per 24 hour  Intake 3874.64 ml  Output 3450 ml  Net 424.64 ml   Weight change:  Exam:  General:  Pt is alert, follows commands appropriately, not in acute distress HEENT: No icterus, No thrush, No neck mass, [place]/AT Cardiovascular: RRR, S1/S2, no rubs, no gallops Respiratory: CTA bilaterally, no wheezing, no crackles, no rhonchi Abdomen: Soft/+BS, non tender, non distended, no guarding Extremities: No edema, No lymphangitis, No petechiae, No rashes, no synovitis   Data Reviewed: I have personally reviewed following labs and imaging studies Basic Metabolic Panel: Recent Labs  Lab 08/04/22 0546 08/05/22 0446 08/06/22 0808 08/07/22 0507 08/08/22 0431 08/09/22 0557  NA 139 141 143 143 147* 141  K 3.8 3.6 3.8 3.8 4.0 3.9  CL 107 114* 116* 114* 120* 114*  CO2 '29 24 22 26 27 24  '$ GLUCOSE 85 95 123* 92 105* 100*  BUN '14 12 23 20 17 14  '$ CREATININE 1.39* 1.14 1.40* 1.37* 1.21 1.14  CALCIUM 12.7*  12.9* 11.1* 13.8* 12.6* 12.7* 11.9*  MG 1.8  --  2.0 1.8  --   --   PHOS  --   --   --  2.0*  --   --    Liver Function Tests: Recent Labs  Lab 08/03/22 1945 08/04/22 0546 08/05/22 0446 08/07/22 0507 08/09/22 0557  AST '20 21 19  '$ --  38  ALT '18 17 17  '$ --  41   ALKPHOS 51 51 48  --  46  BILITOT 0.7 0.8 0.6  --  0.4  PROT 7.3 7.2 6.6  --  7.3  ALBUMIN 3.3* 3.2* 2.9* 2.9* 3.0*   No results for input(s): "LIPASE", "AMYLASE" in the last 168 hours. No results for input(s): "AMMONIA" in the last 168 hours. Coagulation Profile: No results for input(s): "INR", "PROTIME" in the last 168 hours. CBC: Recent Labs  Lab 08/03/22 1945 08/04/22 0546 08/07/22 0507  WBC 3.6* 2.9* 5.4  NEUTROABS 1.8 1.3*  --   HGB 14.9 14.0 13.4  HCT 46.8 44.1 41.9  MCV 91.1 91.9 91.3  PLT 85* 83* 103*   Cardiac Enzymes: No results for input(s): "CKTOTAL", "CKMB", "CKMBINDEX", "TROPONINI" in the last 168 hours. BNP: Invalid input(s): "POCBNP" CBG: Recent Labs  Lab 08/05/22 0739  GLUCAP 97   HbA1C: No results for input(s): "HGBA1C" in the last 72 hours. Urine analysis:    Component Value Date/Time   COLORURINE  AMBER (A) 08/03/2022 2129   APPEARANCEUR TURBID (A) 08/03/2022 2129   LABSPEC 1.013 08/03/2022 2129   PHURINE 8.0 08/03/2022 2129   GLUCOSEU NEGATIVE 08/03/2022 2129   GLUCOSEU NEG mg/dL 04/14/2009 1932   HGBUR MODERATE (A) 08/03/2022 2129   BILIRUBINUR NEGATIVE 08/03/2022 2129   KETONESUR NEGATIVE 08/03/2022 2129   PROTEINUR 100 (A) 08/03/2022 2129   UROBILINOGEN 1 02/13/2012 1456   NITRITE POSITIVE (A) 08/03/2022 2129   LEUKOCYTESUR LARGE (A) 08/03/2022 2129   Sepsis Labs: '@LABRCNTIP'$ (procalcitonin:4,lacticidven:4) ) Recent Results (from the past 240 hour(s))  Urine Culture     Status: Abnormal   Collection Time: 08/03/22  9:29 PM   Specimen: Urine, Clean Catch  Result Value Ref Range Status   Specimen Description   Final    URINE, CLEAN CATCH Performed at [facility], [address], [place], [place] [address]    Special Requests   Final    NONE Performed at [facility], [address], [place], [place] [address]    Culture >=100,000 COLONIES/mL PROTEUS MIRABILIS (A)  Final   Report Status 08/06/2022 FINAL  Final   Organism ID,  Bacteria PROTEUS MIRABILIS (A)  Final      Susceptibility   Proteus mirabilis - MIC*    AMPICILLIN <=2 SENSITIVE Sensitive     CEFAZOLIN <=4 SENSITIVE Sensitive     CEFEPIME <=0.12 SENSITIVE Sensitive     CEFTRIAXONE <=0.25 SENSITIVE Sensitive     CIPROFLOXACIN <=0.25 SENSITIVE Sensitive     GENTAMICIN <=1 SENSITIVE Sensitive     IMIPENEM 2 SENSITIVE Sensitive     NITROFURANTOIN RESISTANT Resistant     TRIMETH/SULFA <=20 SENSITIVE Sensitive     AMPICILLIN/SULBACTAM <=2 SENSITIVE Sensitive     PIP/TAZO <=4 SENSITIVE Sensitive     * >=100,000 COLONIES/mL PROTEUS MIRABILIS     Scheduled Meds:  Darunavir-Cobicistat-Emtricitabine-Tenofovir Alafenamide  1 tablet Oral Daily   feeding supplement  237 mL Oral TID BM   heparin  5,000 Units Subcutaneous Q8H   polyethylene glycol  17 g Oral QHS   Continuous Infusions:  cefTRIAXone (ROCEPHIN)  IV 1 g (08/08/22 1706)   dextrose 5 % and 0.45% NaCl 125 mL/hr at 08/09/22 1534    Procedures/Studies: DG Swallowing Func-Speech Pathology  Result Date: 08/08/2022 Table formatting from the original result was not included. Objective Swallowing Evaluation: Type of Study: MBS-Modified Barium Swallow Study  Patient Details Name: [name] MRN: [id number] Date of Birth: 11-24-1951 Today's Date: 08/07/2022 Time: SLP Start Time (ACUTE ONLY): 1210 -SLP Stop Time (ACUTE ONLY): 1245 SLP Time Calculation (min) (ACUTE ONLY): 35 min Past Medical History: Past Medical History: Diagnosis Date  HIV (human immunodeficiency virus infection) ([place])  Past Surgical History: Past Surgical History: Procedure Laterality Date  HAND SURGERY   HPI: KERIM STATZER is a 71 y.o. male with medical history significant of HIV/AIDS and tobacco use disorder presents the ED with a chief complaint of polyuria.  Patient reports that he had polyuria for 1 month.  He has no dysuria, no hematuria.  Patient reports he has no pain associated with this.  He has had no fevers.  Patient also  denies chest pain, shortness of breath, palpitations, nausea, vomiting, diarrhea, constipation, pain anywhere.  He does report a decreased appetite.  Patient appears emaciated, but swears that he is eating regularly just a decreased amount.  Patient's speech is slurred and difficult to understand.  Girlfriend at bedside reports that this started last week.  Patient thinks it may have started 2  or 3 months ago.  He has been confused per girlfriend.  Just saying things that do not make sense.  It is reported that they cannot get patient out of bed today because of generalized weakness, the patient reports that he has not felt any generalized or asymmetric weakness.  Again this could be due to the confusion.  Patient has no other complaints at this time. BSE requested.  Subjective: "I don't like honey."  Recommendations for follow up therapy are one component of a multi-disciplinary discharge planning process, led by the attending physician.  Recommendations may be updated based on patient status, additional functional criteria and insurance authorization. Assessment / Plan / Recommendation   08/07/2022   1:00 PM Clinical Impressions Clinical Impression Pt presents with moderate oropharyngeal dysphagia with suspected esophageal dysphagia. Oral phase is marked by edentulous status, reduced labial seal, and reduced oral control with reduced bolus cohesiveness with premature spillage of liquids. Pharyngeal swallow is characterized by premature spillage of liquids, adequate hyolaryngeal excursion, reduced tongue base retraction, reduced epiglottic deflection, and reduced pharyngeal pressure resulting in penetration of liquids before and during the swallow (aspiration in trace amounts of thins via tsp and when taking thins via straw with barium tablet), and significant residuals post swallow of liquids (valleculae, lateral channels, and pyriforms). Pt also exhibited pharyngoesophageal reflux of nectars back into the pyriforms  which was then penetrated. While Pt was not observed to aspirate nectars and honey-thick liquids, these resulted in greater pharyngeal residue post swallow, which are likely to increase risk for penetration/aspiration. Pt did not achieve epiglottic deflection with any liquids presented, except when presented with sequential straw sips. Pt with best performance (complete epiglottic deflection and no pharyngeal residue) with puree; min vallecular residue with regular textures and second swallow cleared. Pt primarily subsides on liquids only at this time (Ensure). He has poor vision and difficulty with self feeding. Ideally, Pt would be able to stay on thin liquids (given potential increased negative outcomes for aspirating thickened liquids and perhaps diminished intake) and have someone help feed him D3/mech soft textures, given that Pt has been afebrile and chest xray without acute changes. Pt aspirated trace amount of tsp presentation of thin which elicited strong coughing, but not removed. Aspiration after the swallow was observed but not witnessed (had completed esophageal sweep and returned to pharyngeal view and aspirate noted down anterior tracheal wall) when taking straw sips of thin with barium tablet. The barium tablet remained near LES briefly and eventually passed through to the stomach after puree wash. SLP will discuss with care team and Pt to review goals of care and Pt wishes. Of note, Pt with suspected parathyroid changes. SLP Visit Diagnosis Dysphagia, oropharyngeal phase (R13.12) Impact on safety and function Mild aspiration risk;Moderate aspiration risk;Risk for inadequate nutrition/hydration     08/07/2022   1:00 PM Treatment Recommendations Treatment Recommendations Therapy as outlined in treatment plan below     08/07/2022   1:00 PM Prognosis Prognosis for Safe Diet Advancement Fair Barriers to Reach Goals Severity of deficits   08/07/2022   1:00 PM Diet Recommendations SLP Diet Recommendations  Dysphagia 2 (Fine chop) solids;Thin liquid Liquid Administration via Straw Medication Administration Whole meds with puree Compensations Clear throat intermittently;Multiple dry swallows after each bite/sip Postural Changes Remain semi-upright after after feeds/meals (Comment);Seated upright at 90 degrees     08/07/2022   1:00 PM Other Recommendations Oral Care Recommendations Oral care BID;Staff/trained caregiver to provide oral care Other Recommendations Clarify dietary restrictions Follow Up  Recommendations Skilled nursing-short term rehab (<3 hours/day) Assistance recommended at discharge Intermittent Supervision/Assistance Functional Status Assessment Patient has had a recent decline in their functional status and demonstrates the ability to make significant improvements in function in a reasonable and predictable amount of time.   08/07/2022   1:00 PM Frequency and Duration  Speech Therapy Frequency (ACUTE ONLY) min 2x/week Treatment Duration 1 week     08/07/2022   1:00 PM Oral Phase Oral Phase Impaired Oral - Thin Teaspoon Premature spillage;Decreased bolus cohesion;Lingual/palatal residue Oral - Regular Impaired mastication;Delayed oral transit;Lingual/palatal residue Oral - Pill Delayed oral transit Oral Phase - Comment lingual residuals noted, repeat swallows clear    08/07/2022   1:00 PM Pharyngeal Phase Pharyngeal Phase Impaired Pharyngeal- Honey Cup Delayed swallow initiation-vallecula;Reduced epiglottic inversion;Reduced tongue base retraction;Pharyngeal residue - pyriform;Pharyngeal residue - valleculae;Lateral channel residue Pharyngeal- Nectar Straw Delayed swallow initiation-vallecula;Reduced epiglottic inversion;Reduced tongue base retraction;Penetration/Aspiration during swallow;Pharyngeal residue - valleculae;Pharyngeal residue - pyriform;Lateral channel residue Pharyngeal Material enters airway, remains ABOVE vocal cords then ejected out Pharyngeal- Thin Teaspoon Penetration/Aspiration before  swallow;Penetration/Apiration after swallow;Trace aspiration;Pharyngeal residue - valleculae;Reduced airway/laryngeal closure;Pharyngeal residue - pyriform;Lateral channel residue Pharyngeal Material enters airway, passes BELOW cords and not ejected out despite cough attempt by patient Pharyngeal- Thin Straw Delayed swallow initiation-vallecula;Reduced epiglottic inversion;Reduced pharyngeal peristalsis;Reduced airway/laryngeal closure;Pharyngeal residue - valleculae;Pharyngeal residue - pyriform;Lateral channel residue;Penetration/Apiration after swallow;Penetration/Aspiration during swallow Pharyngeal Material enters airway, remains ABOVE vocal cords then ejected out Pharyngeal- Puree WFL Pharyngeal- Regular Pharyngeal residue - valleculae Pharyngeal- Pill --    08/07/2022   1:00 PM Cervical Esophageal Phase  Cervical Esophageal Phase Impaired Nectar Straw Prominent cricopharyngeal segment;Esophageal backflow into the pharynx Pill Reduced cricopharyngeal relaxation Cervical Esophageal Comment brief stasis of barium tablet near UES, PER of NTL Thank you, Genene Churn, McGrew PORTER,DABNEY 08/07/2022, 1:01 PM                     DG CHEST PORT 1 VIEW  Result Date: 08/06/2022 CLINICAL DATA:  Cough, tachycardia EXAM: PORTABLE CHEST 1 VIEW COMPARISON:  06/12/2007 chest radiograph. FINDINGS: Stable cardiomediastinal silhouette with normal heart size. No pneumothorax. No pleural effusion. A emphysema with bullous emphysema at the right lung apex. No pulmonary edema. No acute consolidative airspace disease. Mild curvilinear left lung base atelectasis. IMPRESSION: 1. Emphysema with bullous emphysema at the right lung apex. 2. Mild curvilinear left lung base atelectasis. Electronically Signed   By: Ilona Sorrel M.D.   On: 08/06/2022 14:33   NM Parathyroid W/Spect  Result Date: 08/06/2022 CLINICAL DATA:  Hypercalcemia. Posterior right paratracheal nodule on CT. EXAM: NM PARATHYROID SCINTIGRAPHY AND SPECT  IMAGING TECHNIQUE: Following intravenous administration of radiopharmaceutical, early and 2-hour delayed planar images were obtained in the anterior projection. Delayed triplanar SPECT images were also obtained at 2 hours. RADIOPHARMACEUTICALS:  27.0 mCi Tc-107mSestamibi IV COMPARISON:  Thyroid ultrasound 08/04/2022.  Chest CT 08/03/2022. FINDINGS: Initial planar images demonstrate asymmetric increased uptake in the right paratracheal region. This persists on delayed plantar images. Delayed SPECT was performed through the neck and superior mediastinum in 3 planes. There is focal hypermetabolic activity in the inferior right paratracheal region, corresponding with the location of the nodule on CT and consistent with an inferior right parathyroid adenoma. IMPRESSION: Previously demonstrated right paratracheal nodule demonstrates persistent delayed sestamibi uptake and is consistent with a right parathyroid adenoma. Electronically Signed   By: WRichardean SaleM.D.   On: 08/06/2022 12:09   [place]YROID  Result Date: 08/05/2022 CLINICAL DATA:  Thyroid nodule seen on CT EXAM: THYROID ULTRASOUND TECHNIQUE: Ultrasound examination of the thyroid gland and adjacent soft tissues was performed. COMPARISON:  CT chest abdomen pelvis 08/03/2022 FINDINGS: Parenchymal Echotexture: Mildly heterogenous Isthmus: 0.9 cm Right lobe: 4.8 x 4.4 x 1.7 cm Left lobe: 3.6 x 3.4 x 1.9 cm _________________________________________________________ Estimated total number of nodules >/= 1 cm: 1 Number of spongiform nodules >/=  2 cm not described below (TR1): 0 Number of mixed cystic and solid nodules >/= 1.5 cm not described below (TR2): 0 _________________________________________________________ Nodule # 1: Location: Right; inferior Maximum size: 2.1 cm; Other 2 dimensions: 1.8 x 1.4 cm Composition: solid/almost completely solid (2) Echogenicity: hypoechoic (2) Shape: not taller-than-wide (0) Margins: ill-defined (0) Echogenic foci: none (0) ACR  TI-RADS total points: 4. ACR TI-RADS risk category: TR4 (4-6 points). ACR TI-RADS recommendations: **Given size (>/= 1.5 cm) and appearance, fine needle aspiration of this moderately suspicious nodule should be considered based on TI-RADS criteria. IMPRESSION: Nodule 1 (TI-RADS 4), measuring 2.1 cm, located in the inferior right thyroid lobe, meets criteria for FNA. Alternatively this nodule could be a parathyroid adenoma given its posterior location. Please correlate with patient symptoms and laboratory workup for hyperparathyroidism. Nuclear medicine parathyroid scan may be obtained if clinically appropriate. The above is in keeping with the ACR TI-RADS recommendations - J Am Coll Radiol 2017;14:587-595. Electronically Signed   By: Miachel Roux M.D.   On: 08/05/2022 08:11   CT HEAD WO CONTRAST (5MM)  Result Date: 08/04/2022 CLINICAL DATA:  Neuro deficit, acute, stroke suspected ?NPH incontinence/wobbly EXAM: CT HEAD WITHOUT CONTRAST TECHNIQUE: Contiguous axial images were obtained from the base of the skull through the vertex without intravenous contrast. RADIATION DOSE REDUCTION: This exam was performed according to the departmental dose-optimization program which includes automated exposure control, adjustment of the mA and/or kV according to patient size and/or use of iterative reconstruction technique. COMPARISON:  08/30/2004 FINDINGS: Brain: 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 There is atrophy and chronic small vessel disease changes. No acute intracranial abnormality. Specifically, no hemorrhage, hydrocephalus, mass lesion, acute infarction, or significant intracranial injury. Vascular: No hyperdense vessel or unexpected calcification. Skull: No acute calvarial abnormality. 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 Atrophy, chronic microvascular disease. No acute intracranial abnormality. Electronically Signed    By: Rolm Baptise M.D.   On: 08/04/2022 00:54   CT L-SPINE NO CHARGE  Result Date: 08/03/2022 CLINICAL DATA:  Prostate cancer, urinary incontinence EXAM: CT LUMBAR SPINE WITHOUT CONTRAST TECHNIQUE: Multidetector CT imaging of the lumbar spine was performed without intravenous contrast administration. Multiplanar CT image reconstructions were also generated. RADIATION DOSE REDUCTION: This exam was performed according to the departmental dose-optimization program which includes automated exposure control, adjustment of the mA and/or kV according to patient size and/or use of iterative reconstruction technique. COMPARISON:  None Available. FINDINGS: Segmentation: 5 lumbar type vertebrae. Alignment: Normal. Vertebrae: The osseous structures are diffusely osteopenic. No acute fracture of the lumbar spine. Vertebral body height is preserved. No lytic or blastic bone lesion. Paraspinal and other soft tissues: The paraspinal soft tissues are unremarkable. Additional soft tissue findings are better described on accompanying CT examination of the chest, abdomen, and pelvis. Disc levels: Intervertebral disc heights are preserved. No significant canal stenosis. No significant neuroforaminal narrowing. IMPRESSION: No acute fracture or malalignment of the lumbar spine. Diffuse osteopenia. Electronically Signed   By: Fidela Salisbury M.D.   On: 08/03/2022 23:31   CT CHEST ABDOMEN  PELVIS W CONTRAST  Result Date: 08/03/2022 CLINICAL DATA:  Progressive weakness, urinary incontinence, prostate cancer EXAM: CT CHEST, ABDOMEN, AND PELVIS WITH CONTRAST TECHNIQUE: Multidetector CT imaging of the chest, abdomen and pelvis was performed following the standard protocol during bolus administration of intravenous contrast. RADIATION DOSE REDUCTION: This exam was performed according to the departmental dose-optimization program which includes automated exposure control, adjustment of the mA and/or kV according to patient size and/or use of  iterative reconstruction technique. CONTRAST:  128m OMNIPAQUE IOHEXOL 300 MG/ML  SOLN COMPARISON:  None Available. FINDINGS: CT CHEST FINDINGS Cardiovascular: No significant coronary artery calcification. Global cardiac size within normal limits. No pericardial effusion. Central pulmonary arteries are of normal caliber. Mild atherosclerotic calcification within the thoracic aorta. No aortic aneurysm. Mediastinum/Nodes: 20 mm exophytic thyroid nodule versus parathyroid adenoma is seen adjacent to the a lower pole of the right thyroid gland., best seen on coronal image # 82/4. This would be better assessed with dedicated thyroid sonography, however, in the setting of significant comorbidities or limited life expectancy, no follow-up recommended (ref: J Am Coll Radiol. 2015 Feb;12(2): 143-50).Shotty right axillary lymphadenopathy is nonspecific and may be reactive in nature. No frankly pathologic thoracic adenopathy identified. The esophagus is unremarkable. Lungs/Pleura: Severe emphysema with right apical bulla formation. Mild elevation of the left hemidiaphragm with adjacent left lower lobe rounded atelectasis. No superimposed confluent pulmonary infiltrate. No pneumothorax or pleural effusion. Musculoskeletal: Osseous structures appear diffusely osteopenic. No focal lytic or blastic bone lesions are seen. No acute bone abnormality. CT ABDOMEN PELVIS FINDINGS Hepatobiliary: No focal liver abnormality is seen. No gallstones, gallbladder wall thickening, or biliary dilatation. Pancreas: Unremarkable Spleen: Unremarkable Adrenals/Urinary Tract: The adrenal glands are unremarkable. The kidneys are normal in size and position. Multiple simple cortical cysts are seen within the kidneys bilaterally. No follow-up imaging is recommended for these lesions. No enhancing intrarenal mass identified. No hydronephrosis. No intrarenal or ureteral calculi. A 2.7 cm rounded bladder calculus is seen dependently within the bladder  lumen. There is superimposed circumferential bladder wall thickening, more severe along the posterior wall which may relate to chronic infectious or inflammatory cystitis. The bladder is not distended. Stomach/Bowel: Moderate stool within the rectosigmoid colon without evidence of obstruction. The stomach, small bowel, and large bowel are otherwise unremarkable. No free intraperitoneal gas or fluid. Vascular/Lymphatic: Aortic atherosclerosis. No enlarged abdominal or pelvic lymph nodes. Reproductive: Mild prostatic enlargement. Other: No abdominal wall hernia Musculoskeletal: The osseous structures are diffusely osteopenic. No focal lytic or blastic bone lesions are identified. IMPRESSION: 1. No acute intrathoracic or intra-abdominal pathology identified. No definite radiographic explanation for the patient's reported symptoms. 2. Severe emphysema with right apical bulla formation. 3. 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 Dedicated thyroid sonography and correlation with serum parathyroid hormone level and thyroid function test may be helpful for further evaluation. 4. 2.7 cm bladder calculus. Superimposed circumferential bladder wall thickening may relate to superimposed chronic infectious or inflammatory cystitis. Correlation with urinalysis and urine culture may be helpful. 5. Moderate stool within the rectosigmoid colon without evidence of obstruction. 6. Mild prostatic enlargement. Emphysema (ICD10-J43.9). Electronically Signed   By: AFidela SalisburyM.D.   On: 08/03/2022 23:22    DOrson Eva DO  [facility]  If 7PM-7AM, please contact night-coverage [email or web address] Password [email or web address]8/10/2022, 4:43 PM   LOS: 5 days

## 2022-08-09 NOTE — Progress Notes (Signed)
Physical Therapy Treatment Patient Details Name: [name] MRN: [id number] DOB: 01-08-1951 Today's Date: 08/09/2022   History of Present Illness [name] is a 71 y.o. male with medical history significant of HIV/AIDS and tobacco use disorder presents the ED with a chief complaint of polyuria.  Patient reports that he had polyuria for 1 month.  He has no dysuria, no hematuria.  Patient reports he has no pain associated with this.  He has had no fevers.  Patient also denies chest pain, shortness of breath, palpitations, nausea, vomiting, diarrhea, constipation, pain anywhere.  He does report a decreased appetite.  Patient appears emaciated, but swears that he is eating regularly just a decreased amount.  Patient's speech is slurred and difficult to understand.  Girlfriend at bedside reports that this started last week.  Patient thinks it may have started 2 or 3 months ago.  He has been confused per girlfriend.  Just saying things that do not make sense.  It is reported that they cannot get patient out of bed today because of generalized weakness, the patient reports that he has not felt any generalized or asymmetric weakness.  Again this could be due to the confusion.  Patient has no other complaints at this time.    PT Comments    Patient requires assist and cueing to transition to seated EOB. He demonstrates fair sitting tolerance and good sitting balance at EOB while completing exercises. Frequent cueing required for utilizing full ROM with exercises with good/fair carry over. Patient not wishing to perform any further mobility and is assisted back to supine. Patient will benefit from continued skilled physical therapy in hospital and recommended venue below to increase strength, balance, endurance for safe ADLs and gait.    Recommendations for follow up therapy are one component of a multi-disciplinary discharge planning process, led by the attending physician.  Recommendations may  be updated based on patient status, additional functional criteria and insurance authorization.  Follow Up Recommendations  Skilled nursing-short term rehab (<3 hours/day) Can patient physically be transported by private vehicle: Yes   Assistance Recommended at Discharge Intermittent Supervision/Assistance  Patient can return home with the following A lot of help with bathing/dressing/bathroom;A lot of help with walking and/or transfers;Help with stairs or ramp for entrance;Assistance with cooking/housework   Equipment Recommendations       Recommendations for Other Services       Precautions / Restrictions Precautions Precautions: Fall Restrictions Weight Bearing Restrictions: No     Mobility  Bed Mobility Overal bed mobility: Needs Assistance Bed Mobility: Supine to Sit, Sit to Supine     Supine to sit: Min assist Sit to supine: Min assist   General bed mobility comments: slow labored movements; assist to upright trunk and LE movement, cueing for sequencing    Transfers                        Ambulation/Gait                   Stairs             Wheelchair Mobility    Modified Rankin (Stroke Patients Only)       Balance Overall balance assessment: Needs assistance Sitting-balance support: Feet supported, No upper extremity supported Sitting balance-Leahy Scale: Fair Sitting balance - Comments: fair/good seated at EOB  Cognition Arousal/Alertness: Awake/alert Behavior During Therapy: WFL for tasks assessed/performed Overall Cognitive Status: Within Functional Limits for tasks assessed                                 General Comments: requires repeated verbal/tactile cueing for completing exercise        Exercises General Exercises - Lower Extremity Ankle Circles/Pumps: AROM, Both, 10 reps, Seated Long Arc Quad: AROM, Both, 15 reps, Seated Hip Flexion/Marching:  AROM, Both, 10 reps, Seated    General Comments        Pertinent Vitals/Pain Pain Assessment Pain Assessment: No/denies pain    Home Living                          Prior Function            PT Goals (current goals can now be found in the care plan section) Acute Rehab PT Goals Patient Stated Goal: return home with friends to assist PT Goal Formulation: With patient Time For Goal Achievement: 08/18/22 Potential to Achieve Goals: Fair Progress towards PT goals: Progressing toward goals    Frequency    Min 3X/week      PT Plan Current plan remains appropriate    Co-evaluation              AM-PAC PT "6 Clicks" Mobility   Outcome Measure  Help needed turning from your back to your side while in a flat bed without using bedrails?: A Little Help needed moving from lying on your back to sitting on the side of a flat bed without using bedrails?: A Little Help needed moving to and from a bed to a chair (including a wheelchair)?: A Little Help needed standing up from a chair using your arms (e.g., wheelchair or bedside chair)?: A Lot Help needed to walk in hospital room?: A Lot Help needed climbing 3-5 steps with a railing? : Total 6 Click Score: 14    End of Session   Activity Tolerance: Patient tolerated treatment well;Patient limited by fatigue Patient left: in bed;with bed alarm set;with call bell/phone within reach Nurse Communication: Mobility status PT Visit Diagnosis: Unsteadiness on feet (R26.81);Other abnormalities of gait and mobility (R26.89);Muscle weakness (generalized) (M62.81)     Time: 2595-6387 PT Time Calculation (min) (ACUTE ONLY): 15 min  Charges:  $Therapeutic Exercise: 8-22 mins                     10:40 AM, 08/09/22 [name] PT, DPT Physical Therapist at [facility]

## 2022-08-09 NOTE — Care Management Important Message (Signed)
Important Message  Patient Details  Name: [name] MRN: [id number] Date of Birth: [birth year]   Medicare Important Message Given:  Yes     Tommy Medal 08/09/2022, 1:04 PM

## 2022-08-09 NOTE — TOC Progression Note (Addendum)
Transition of Care [facility]) - Progression Note    Patient Details  Name: [name] MRN: [id number] Date of Birth: 1951-12-14  [facility] CM/SW Contact  Shade Flood, LCSW Phone Number: 08/09/2022, 12:06 PM  Clinical Narrative:     TOC following. MD anticipating pt may be stable for dc to SNF tomorrow. Josem Kaufmann is currently still pending. TOC hopeful decision will be made yet today.   Referral made for outpatient palliative with [facility] after dc.  Updated Debbie at [facility]. Weekend TOC will follow up in AM.  Expected Discharge Plan: [place] Barriers to Discharge: Continued Medical Work up  Expected Discharge Plan and Services Expected Discharge Plan: [place] In-house Referral: Clinical Social Work Discharge Planning Services: CM Consult Post Acute Care Choice: [place] Living arrangements for the past 2 months: Single Family Home                                       Social Determinants of Health (SDOH) Interventions    Readmission Risk Interventions     No data to display

## 2022-08-09 NOTE — Progress Notes (Signed)
Speech Language Pathology Treatment: Dysphagia  Patient Details Name: RITHVIK ORCUTT MRN: [id number] DOB: 1951-12-04 Today's Date: 08/09/2022 Time: 9924-2683 SLP Time Calculation (min) (ACUTE ONLY): 21 min  Assessment / Plan / Recommendation Clinical Impression  Ongoing diagnostic dysphagia therapy provided today. Pt was provided oral care and then sips of thin liquids; note delayed gurgle, belch and wet coughing following large sips of thin trials. Note decreased wet vocal quality with smaller sips of thin liquid and with verbal cues and even pinching the straw to control bolus size, decreased coughing was observed. SLP provided some NTL trials with almost identical presentation. Based on MBSS and risk of postprandial aspiration, agree and recommend continue with thin liquids at this time. Recommend continue with D2/fine chop diet also. Also agree Pt will benefit from 1:1 feeder to facilitate slow rate and small sips recommendation. Reviewed recommendations with nursing. SLP will continue to follow and recommend ST f/u at d/c location.    HPI HPI: ERIQ HUFFORD is a 71 y.o. male with medical history significant of HIV/AIDS and tobacco use disorder presents the ED with a chief complaint of polyuria.  Patient reports that he had polyuria for 1 month.  He has no dysuria, no hematuria.  Patient reports he has no pain associated with this.  He has had no fevers.  Patient also denies chest pain, shortness of breath, palpitations, nausea, vomiting, diarrhea, constipation, pain anywhere.  He does report a decreased appetite.  Patient appears emaciated, but swears that he is eating regularly just a decreased amount.  Patient's speech is slurred and difficult to understand.  Girlfriend at bedside reports that this started last week.  Patient thinks it may have started 2 or 3 months ago.  He has been confused per girlfriend.  Just saying things that do not make sense.  It is reported that they cannot get  patient out of bed today because of generalized weakness, the patient reports that he has not felt any generalized or asymmetric weakness.  Again this could be due to the confusion.  Patient has no other complaints at this time. BSE requested.      SLP Plan  Continue with current plan of care      Recommendations for follow up therapy are one component of a multi-disciplinary discharge planning process, led by the attending physician.  Recommendations may be updated based on patient status, additional functional criteria and insurance authorization.    Recommendations  Diet recommendations: Dysphagia 2 (fine chop);Thin liquid Liquids provided via: Straw;Cup Medication Administration: Whole meds with puree Supervision: Full supervision/cueing for compensatory strategies;Staff to assist with self feeding Compensations: Clear throat intermittently;Multiple dry swallows after each bite/sip Postural Changes and/or Swallow Maneuvers: Upright 30-60 min after meal;Seated upright 90 degrees                Oral Care Recommendations: Staff/trained caregiver to provide oral care Follow Up Recommendations: Skilled nursing-short term rehab (<3 hours/day) Assistance recommended at discharge: Intermittent Supervision/Assistance SLP Visit Diagnosis: Dysphagia, oropharyngeal phase (R13.12) Plan: Continue with current plan of care         Eris Breck H. Roddie Mc, CCC-SLP Speech Language Pathologist   Wende Bushy  08/09/2022, 2:58 PM

## 2022-08-09 NOTE — Progress Notes (Signed)
Patient incontinent this shift. He will eat as long as he is being fed. I encouraged water due to his tongue being dry on my initial assessment. He later was able to ask for water when I was in the room. Thickener used as head of bed elevated when he was drinking and afterwards.

## 2022-08-10 DIAGNOSIS — E870 Hyperosmolality and hypernatremia: Secondary | ICD-10-CM

## 2022-08-10 DIAGNOSIS — E559 Vitamin D deficiency, unspecified: Secondary | ICD-10-CM | POA: Diagnosis present

## 2022-08-10 DIAGNOSIS — N3001 Acute cystitis with hematuria: Secondary | ICD-10-CM

## 2022-08-10 LAB — COMPREHENSIVE METABOLIC PANEL
ALT: 37 U/L (ref 0–44)
AST: 36 U/L (ref 15–41)
Albumin: 2.8 g/dL — ABNORMAL LOW (ref 3.5–5.0)
Alkaline Phosphatase: 43 U/L (ref 38–126)
Anion gap: 2 — ABNORMAL LOW (ref 5–15)
BUN: 13 mg/dL (ref 8–23)
CO2: 25 mmol/L (ref 22–32)
Calcium: 11.2 mg/dL — ABNORMAL HIGH (ref 8.9–10.3)
Chloride: 111 mmol/L (ref 98–111)
Creatinine, Ser: 1.14 mg/dL (ref 0.61–1.24)
GFR, Estimated: >60 mL/min (ref >60)
Glucose, Bld: 87 mg/dL (ref 70–99)
Potassium: 4 mmol/L (ref 3.5–5.1)
Sodium: 138 mmol/L (ref 135–145)
Total Bilirubin: 0.5 mg/dL (ref 0.3–1.2)
Total Protein: 7 g/dL (ref 6.5–8.1)

## 2022-08-10 MED ORDER — ACETAMINOPHEN 325 MG PO TABS: 650.0000 mg | ORAL_TABLET | Freq: Four times a day (QID) | ORAL | 0 refills | Status: DC | PRN

## 2022-08-10 MED ORDER — ENSURE ENLIVE PO LIQD: 237.0000 mL | Freq: Three times a day (TID) | ORAL | 12 refills | Status: DC

## 2022-08-10 MED ORDER — ONDANSETRON HCL 4 MG PO TABS: 4.0000 mg | ORAL_TABLET | Freq: Four times a day (QID) | ORAL | 0 refills | Status: DC | PRN

## 2022-08-10 NOTE — Assessment & Plan Note (Signed)
(  mild), Consider starting VItamin D supplementation once hypercalcemia resolved

## 2022-08-10 NOTE — Progress Notes (Addendum)
CSW spoke with Jackelyn Poling at [facility] who states the facility can accept the patient today.  Patient will go to room B16-2. The number to call for report is [phone]. [facility] called for transport.  Madilyn Fireman, MSW, LCSW Transitions of Care  Clinical Social Worker II [phone]

## 2022-08-10 NOTE — Discharge Summary (Addendum)
Physician Discharge Summary   Patient: [name] MRN: [id number] DOB: 1951/05/29  Admit date:     08/03/2022  Discharge date: 08/10/22  Discharge Physician: Jani Gravel   PCP:  [facility] Mauricio Po)  Recommendations at discharge:    Parathyroid adenoma --Work up with elevated PTH and positive NM  Parathyroid scan consistent with primary hyperparathyroidism --appointment made with General Surgery Dr. Armandina Gemma for 09/12/22 at 1:50PM  Hypercalcemia - Calcium is 13.1 on admission - Parathyroid nodule noted on early imaging - PTH 71 (high) Ca 12.9 - suggests primary hyperparathyroidism - ordered IV normal saline, started calcitonin x 4 doses, given IV zometa 4 mg x 1 dose -- thyroid US suggesting parathyroid adenoma and suggested getting a parathyroid nuclear scan which was completed and 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    - calcium initially trended down after starting calcitonin and giving IV zometa (8/6) -calcium trending down again with increase IVF - work up suggests primary hyperparathyroidism - review of records shows he's been hypercalcemic since 12/16/2017 - appointment made with gen surgery, Dr. Harlow Asa for 09/12/22 at 1:50PM  -please check cmp in 3-5 days.   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, please continue with ensure or other protein supplementation   Hypernatremia Resolved, likely secondary to dehydration   Urinary incontinence/ UTI - finished treating UTI and he still has a large bladder stone that needs to be addressed outpatient per urology SNF physician to please refer to Urology for evaluation  UTI (urinary tract infection) - UA indicative of UTI - Urine culture = Proteus - Patient was started on Cipro in the ED, but appreciate pharmacy researching if  patient can tolerate cephalosporins -Ceftriaxone>>finished 7 days 8/12  Dysarthria -Dysarthria for one week -No associated difficulty swallowing, or asymmetric weakness -no numbness -likely secondary to hypercalcemia -ST consult appreciated>>dysphagia 2 diet with thin liquids -overall improved     Acute metabolic encephalopathy -Likely secondary to hypercalcemia and UTI -CT head is without acute changes -improving slowly with supportive care and improving calcium   AIDS (acquired immune deficiency syndrome) ([place]) -pt reported that he has been out of meds x 4-5 months, having difficulty with transportation  -CD4 count 238 and Viral load 38K -restarted home medication Symtuza -appointment made for [facility] for 08/14/22 at 2:30 pm   Hypovitaminosis D Consider starting vitamin D once hypercalcemia resolved    Procedures: As Listed Above     Discharge Diagnoses: Principal Problem:   Failure to thrive in adult Active Problems:   Parathyroid adenoma   AIDS (acquired immune deficiency syndrome) ([place])   Acute metabolic encephalopathy   Hypercalcemia   UTI (urinary tract infection)   Dysarthria   Urinary incontinence   Hypernatremia   Hypovitaminosis D  Resolved Problems:   * No resolved hospital problems. * UTI resolved  Hospital Course:  71 y.o. male with medical history significant of HIV/AIDS and tobacco use disorder presents the ED with a chief complaint of polyuria.  Patient reports that he had polyuria for 1 month.  He has no dysuria, no hematuria.  Patient reports he has no pain associated with this.  He has had no fevers.  Patient also denies chest pain, shortness of breath, palpitations, nausea, vomiting, diarrhea, constipation, pain anywhere.  He does report a decreased appetite.  Patient appears emaciated, but swears that  he is eating regularly just a decreased amount.  Patient's speech is slurred and difficult to understand.  Girlfriend at bedside reports that this  started last week.  Patient thinks it may have started 2 or 3 months ago.  He has been confused per girlfriend.  Just saying things that do not make sense.  It is reported that they cannot get patient out of bed today because of generalized weakness, the patient reports that he has not felt any generalized or asymmetric weakness.  Again this could be due to the confusion.  Patient has no other complaints at this time.   Patient is a current smoker, but declines nicotine patch at this time.  He does not drink, does not use illicit drugs, is not vaccinated for COVID.  Patient is full code.  In ED, pt treated with cipro.  This was discontinued and pt was started on Ceftriaxone for proteus,  pt completed 7d of abx.  Pt iv Zometa.  W/up of hypercalcemia, revealed a parathyroid adenoma. Pt was made an appointment for Dr. Harlow Asa as stated above. Pt started on ensure for severe protein calorie malnutrition. Pt will also need to follow up with urology as outpatient for bladder stone as well as ID for HIV.   Consultants: none Procedures performed: see above  Disposition: [facility]  Diet recommendation:  Dysphagia 2 w thin liquid DISCHARGE MEDICATION: Allergies as of 08/10/2022       Reactions   Penicillins Hives   Has patient had a PCN reaction causing immediate rash, facial/tongue/throat swelling, SOB or lightheadedness with hypotension: No Has patient had a PCN reaction causing severe rash involving mucus membranes or skin necrosis: No Has patient had a PCN reaction that required hospitalization: No Has patient had a PCN reaction occurring within the last 10 years: No If all of the above answers are "NO", then may proceed with Cephalosporin use.        Medication List     TAKE these medications    acetaminophen 325 MG tablet Commonly known as: TYLENOL Take 2 tablets (650 mg total) by mouth every 6 (six) hours as needed for mild pain (or Fever >/= 101).   feeding supplement  Liqd Take 237 mLs by mouth 3 (three) times daily between meals.   ondansetron 4 MG tablet Commonly known as: ZOFRAN Take 1 tablet (4 mg total) by mouth every 6 (six) hours as needed for nausea.   Symtuza 800-150-200-10 MG Tabs Generic drug: Darunavir-Cobicistat-Emtricitabine-Tenofovir Alafenamide TAKE 1 TABLET BY MOUTH DAILY.               Durable Medical Equipment  (From admission, onward)           Start     Ordered   08/04/22 1208  For home use only DME Walker rolling  Once       Comments: Patient unable to maintain standing balance without AD and at high risk for falls.  Patient safer using RW with assist.  Question Answer Comment  Walker: With 5 Inch Wheels   Patient needs a walker to treat with the following condition Gait difficulty      08/04/22 1208            Contact information for follow-up providers     Armandina Gemma, MD. Go on 09/12/2022.   Specialty: General Surgery Why: Please arrive at 1:50pm for an appointment time of 2:15. Contact information: [address] [place] [address] [phone]  Contact information for after-discharge care     [facility] .   Service: Skilled Nursing Contact information: [address] [place] [phone]                    Discharge Exam: Danley Danker Weights   08/04/22 0500  Weight: 59.5 kg   Gen: cachectic Heent: anicteric, slight arcus senilis Neck: no jvd Heart: rrr s1, s2, no m/g/r Lung: ctab Abd: soft, flat, nt, nd, +bs Ext: no c/c/e Skin: no rash Lymph: no cervical adenopathy Neuro: nonfocal, general weakness  Condition at discharge: Stable   The results of significant diagnostics from this hospitalization (including imaging, microbiology, ancillary and laboratory) are listed below for reference.   Imaging Studies: DG Swallowing Func-Speech Pathology  Result Date:  08/08/2022 Table formatting from the original result was not included. Objective Swallowing Evaluation: Type of Study: MBS-Modified Barium Swallow Study  Patient Details Name: NIZAR CUTLER MRN: [id number] Date of Birth: 05/13/1951 Today's Date: 08/07/2022 Time: SLP Start Time (ACUTE ONLY): 1210 -SLP Stop Time (ACUTE ONLY): 1245 SLP Time Calculation (min) (ACUTE ONLY): 35 min Past Medical History: Past Medical History: Diagnosis Date  HIV (human immunodeficiency virus infection) ([place])  Past Surgical History: Past Surgical History: Procedure Laterality Date  HAND SURGERY   HPI: RICKARDO BRINEGAR is a 71 y.o. male with medical history significant of HIV/AIDS and tobacco use disorder presents the ED with a chief complaint of polyuria.  Patient reports that he had polyuria for 1 month.  He has no dysuria, no hematuria.  Patient reports he has no pain associated with this.  He has had no fevers.  Patient also denies chest pain, shortness of breath, palpitations, nausea, vomiting, diarrhea, constipation, pain anywhere.  He does report a decreased appetite.  Patient appears emaciated, but swears that he is eating regularly just a decreased amount.  Patient's speech is slurred and difficult to understand.  Girlfriend at bedside reports that this started last week.  Patient thinks it may have started 2 or 3 months ago.  He has been confused per girlfriend.  Just saying things that do not make sense.  It is reported that they cannot get patient out of bed today because of generalized weakness, the patient reports that he has not felt any generalized or asymmetric weakness.  Again this could be due to the confusion.  Patient has no other complaints at this time. BSE requested.  Subjective: "I don't like honey."  Recommendations for follow up therapy are one component of a multi-disciplinary discharge planning process, led by the attending physician.  Recommendations may be updated based on patient status, additional  functional criteria and insurance authorization. Assessment / Plan / Recommendation   08/07/2022   1:00 PM Clinical Impressions Clinical Impression Pt presents with moderate oropharyngeal dysphagia with suspected esophageal dysphagia. Oral phase is marked by edentulous status, reduced labial seal, and reduced oral control with reduced bolus cohesiveness with premature spillage of liquids. Pharyngeal swallow is characterized by premature spillage of liquids, adequate hyolaryngeal excursion, reduced tongue base retraction, reduced epiglottic deflection, and reduced pharyngeal pressure resulting in penetration of liquids before and during the swallow (aspiration in trace amounts of thins via tsp and when taking thins via straw with barium tablet), and significant residuals post swallow of liquids (valleculae, lateral channels, and pyriforms). Pt also exhibited pharyngoesophageal reflux of nectars back into the pyriforms which was then penetrated. While Pt was not observed  to aspirate nectars and honey-thick liquids, these resulted in greater pharyngeal residue post swallow, which are likely to increase risk for penetration/aspiration. Pt did not achieve epiglottic deflection with any liquids presented, except when presented with sequential straw sips. Pt with best performance (complete epiglottic deflection and no pharyngeal residue) with puree; min vallecular residue with regular textures and second swallow cleared. Pt primarily subsides on liquids only at this time (Ensure). He has poor vision and difficulty with self feeding. Ideally, Pt would be able to stay on thin liquids (given potential increased negative outcomes for aspirating thickened liquids and perhaps diminished intake) and have someone help feed him D3/mech soft textures, given that Pt has been afebrile and chest xray without acute changes. Pt aspirated trace amount of tsp presentation of thin which elicited strong coughing, but not removed. Aspiration  after the swallow was observed but not witnessed (had completed esophageal sweep and returned to pharyngeal view and aspirate noted down anterior tracheal wall) when taking straw sips of thin with barium tablet. The barium tablet remained near LES briefly and eventually passed through to the stomach after puree wash. SLP will discuss with care team and Pt to review goals of care and Pt wishes. Of note, Pt with suspected parathyroid changes. SLP Visit Diagnosis Dysphagia, oropharyngeal phase (R13.12) Impact on safety and function Mild aspiration risk;Moderate aspiration risk;Risk for inadequate nutrition/hydration     08/07/2022   1:00 PM Treatment Recommendations Treatment Recommendations Therapy as outlined in treatment plan below     08/07/2022   1:00 PM Prognosis Prognosis for Safe Diet Advancement Fair Barriers to Reach Goals Severity of deficits   08/07/2022   1:00 PM Diet Recommendations SLP Diet Recommendations Dysphagia 2 (Fine chop) solids;Thin liquid Liquid Administration via Straw Medication Administration Whole meds with puree Compensations Clear throat intermittently;Multiple dry swallows after each bite/sip Postural Changes Remain semi-upright after after feeds/meals (Comment);Seated upright at 90 degrees     08/07/2022   1:00 PM Other Recommendations Oral Care Recommendations Oral care BID;Staff/trained caregiver to provide oral care Other Recommendations Clarify dietary restrictions Follow Up Recommendations Skilled nursing-short term rehab (<3 hours/day) Assistance recommended at discharge Intermittent Supervision/Assistance Functional Status Assessment Patient has had a recent decline in their functional status and demonstrates the ability to make significant improvements in function in a reasonable and predictable amount of time.   08/07/2022   1:00 PM Frequency and Duration  Speech Therapy Frequency (ACUTE ONLY) min 2x/week Treatment Duration 1 week     08/07/2022   1:00 PM Oral Phase Oral Phase Impaired  Oral - Thin Teaspoon Premature spillage;Decreased bolus cohesion;Lingual/palatal residue Oral - Regular Impaired mastication;Delayed oral transit;Lingual/palatal residue Oral - Pill Delayed oral transit Oral Phase - Comment lingual residuals noted, repeat swallows clear    08/07/2022   1:00 PM Pharyngeal Phase Pharyngeal Phase Impaired Pharyngeal- Honey Cup Delayed swallow initiation-vallecula;Reduced epiglottic inversion;Reduced tongue base retraction;Pharyngeal residue - pyriform;Pharyngeal residue - valleculae;Lateral channel residue Pharyngeal- Nectar Straw Delayed swallow initiation-vallecula;Reduced epiglottic inversion;Reduced tongue base retraction;Penetration/Aspiration during swallow;Pharyngeal residue - valleculae;Pharyngeal residue - pyriform;Lateral channel residue Pharyngeal Material enters airway, remains ABOVE vocal cords then ejected out Pharyngeal- Thin Teaspoon Penetration/Aspiration before swallow;Penetration/Apiration after swallow;Trace aspiration;Pharyngeal residue - valleculae;Reduced airway/laryngeal closure;Pharyngeal residue - pyriform;Lateral channel residue Pharyngeal Material enters airway, passes BELOW cords and not ejected out despite cough attempt by patient Pharyngeal- Thin Straw Delayed swallow initiation-vallecula;Reduced epiglottic inversion;Reduced pharyngeal peristalsis;Reduced airway/laryngeal closure;Pharyngeal residue - valleculae;Pharyngeal residue - pyriform;Lateral channel residue;Penetration/Apiration after swallow;Penetration/Aspiration during swallow Pharyngeal Material enters airway, remains ABOVE  vocal cords then ejected out Pharyngeal- Puree WFL Pharyngeal- Regular Pharyngeal residue - valleculae Pharyngeal- Pill --    08/07/2022   1:00 PM Cervical Esophageal Phase  Cervical Esophageal Phase Impaired Nectar Straw Prominent cricopharyngeal segment;Esophageal backflow into the pharynx Pill Reduced cricopharyngeal relaxation Cervical Esophageal Comment brief stasis of  barium tablet near UES, PER of NTL Thank you, Genene Churn, [place] PORTER,DABNEY 08/07/2022, 1:01 PM                     DG CHEST PORT 1 VIEW  Result Date: 08/06/2022 CLINICAL DATA:  Cough, tachycardia EXAM: PORTABLE CHEST 1 VIEW COMPARISON:  06/12/2007 chest radiograph. FINDINGS: Stable cardiomediastinal silhouette with normal heart size. No pneumothorax. No pleural effusion. A emphysema with bullous emphysema at the right lung apex. No pulmonary edema. No acute consolidative airspace disease. Mild curvilinear left lung base atelectasis. IMPRESSION: 1. Emphysema with bullous emphysema at the right lung apex. 2. Mild curvilinear left lung base atelectasis. Electronically Signed   By: Ilona Sorrel M.D.   On: 08/06/2022 14:33   NM Parathyroid W/Spect  Result Date: 08/06/2022 CLINICAL DATA:  Hypercalcemia. Posterior right paratracheal nodule on CT. EXAM: NM PARATHYROID SCINTIGRAPHY AND SPECT IMAGING TECHNIQUE: Following intravenous administration of radiopharmaceutical, early and 2-hour delayed planar images were obtained in the anterior projection. Delayed triplanar SPECT images were also obtained at 2 hours. RADIOPHARMACEUTICALS:  27.0 mCi Tc-32mSestamibi IV COMPARISON:  Thyroid ultrasound 08/04/2022.  Chest CT 08/03/2022. FINDINGS: Initial planar images demonstrate asymmetric increased uptake in the right paratracheal region. This persists on delayed plantar images. Delayed SPECT was performed through the neck and superior mediastinum in 3 planes. There is focal hypermetabolic activity in the inferior right paratracheal region, corresponding with the location of the nodule on CT and consistent with an inferior right parathyroid adenoma. IMPRESSION: Previously demonstrated right paratracheal nodule demonstrates persistent delayed sestamibi uptake and is consistent with a right parathyroid adenoma. Electronically Signed   By: WRichardean SaleM.D.   On: 08/06/2022 12:09   [place]YROID  Result  Date: 08/05/2022 CLINICAL DATA:  Thyroid nodule seen on CT EXAM: THYROID ULTRASOUND TECHNIQUE: Ultrasound examination of the thyroid gland and adjacent soft tissues was performed. COMPARISON:  CT chest abdomen pelvis 08/03/2022 FINDINGS: Parenchymal Echotexture: Mildly heterogenous Isthmus: 0.9 cm Right lobe: 4.8 x 4.4 x 1.7 cm Left lobe: 3.6 x 3.4 x 1.9 cm _________________________________________________________ Estimated total number of nodules >/= 1 cm: 1 Number of spongiform nodules >/=  2 cm not described below (TR1): 0 Number of mixed cystic and solid nodules >/= 1.5 cm not described below (TR2): 0 _________________________________________________________ Nodule # 1: Location: Right; inferior Maximum size: 2.1 cm; Other 2 dimensions: 1.8 x 1.4 cm Composition: solid/almost completely solid (2) Echogenicity: hypoechoic (2) Shape: not taller-than-wide (0) Margins: ill-defined (0) Echogenic foci: none (0) ACR TI-RADS total points: 4. ACR TI-RADS risk category: TR4 (4-6 points). ACR TI-RADS recommendations: **Given size (>/= 1.5 cm) and appearance, fine needle aspiration of this moderately suspicious nodule should be considered based on TI-RADS criteria. IMPRESSION: Nodule 1 (TI-RADS 4), measuring 2.1 cm, located in the inferior right thyroid lobe, meets criteria for FNA. Alternatively this nodule could be a parathyroid adenoma given its posterior location. Please correlate with patient symptoms and laboratory workup for hyperparathyroidism. Nuclear medicine parathyroid scan may be obtained if clinically appropriate. The above is in keeping with the ACR TI-RADS recommendations - J Am Coll Radiol 2017;14:587-595. Electronically Signed   By: FDanie BinderD.  On: 08/05/2022 08:11   CT HEAD WO CONTRAST (5MM)  Result Date: 08/04/2022 CLINICAL DATA:  Neuro deficit, acute, stroke suspected ?NPH incontinence/wobbly EXAM: CT HEAD WITHOUT CONTRAST TECHNIQUE: Contiguous axial images were obtained from the base of the  skull through the vertex without intravenous contrast. RADIATION DOSE REDUCTION: This exam was performed according to the departmental dose-optimization program which includes automated exposure control, adjustment of the mA and/or kV according to patient size and/or use of iterative reconstruction technique. COMPARISON:  08/30/2004 FINDINGS: Brain: 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 There is atrophy and chronic small vessel disease changes. No acute intracranial abnormality. Specifically, no hemorrhage, hydrocephalus, mass lesion, acute infarction, or significant intracranial injury. Vascular: No hyperdense vessel or unexpected calcification. Skull: No acute calvarial abnormality. 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 Atrophy, chronic microvascular disease. No acute intracranial abnormality. Electronically Signed   By: Rolm Baptise M.D.   On: 08/04/2022 00:54   CT L-SPINE NO CHARGE  Result Date: 08/03/2022 CLINICAL DATA:  Prostate cancer, urinary incontinence EXAM: CT LUMBAR SPINE WITHOUT CONTRAST TECHNIQUE: Multidetector CT imaging of the lumbar spine was performed without intravenous contrast administration. Multiplanar CT image reconstructions were also generated. RADIATION DOSE REDUCTION: This exam was performed according to the departmental dose-optimization program which includes automated exposure control, adjustment of the mA and/or kV according to patient size and/or use of iterative reconstruction technique. COMPARISON:  None Available. FINDINGS: Segmentation: 5 lumbar type vertebrae. Alignment: Normal. Vertebrae: The osseous structures are diffusely osteopenic. No acute fracture of the lumbar spine. Vertebral body height is preserved. No lytic or blastic bone lesion. Paraspinal and other soft tissues: The paraspinal soft tissues are unremarkable. Additional soft tissue findings are better described on  accompanying CT examination of the chest, abdomen, and pelvis. Disc levels: Intervertebral disc heights are preserved. No significant canal stenosis. No significant neuroforaminal narrowing. IMPRESSION: No acute fracture or malalignment of the lumbar spine. Diffuse osteopenia. Electronically Signed   By: Fidela Salisbury M.D.   On: 08/03/2022 23:31   CT CHEST ABDOMEN PELVIS W CONTRAST  Result Date: 08/03/2022 CLINICAL DATA:  Progressive weakness, urinary incontinence, prostate cancer EXAM: CT CHEST, ABDOMEN, AND PELVIS WITH CONTRAST TECHNIQUE: Multidetector CT imaging of the chest, abdomen and pelvis was performed following the standard protocol during bolus administration of intravenous contrast. RADIATION DOSE REDUCTION: This exam was performed according to the departmental dose-optimization program which includes automated exposure control, adjustment of the mA and/or kV according to patient size and/or use of iterative reconstruction technique. CONTRAST:  119m OMNIPAQUE IOHEXOL 300 MG/ML  SOLN COMPARISON:  None Available. FINDINGS: CT CHEST FINDINGS Cardiovascular: No significant coronary artery calcification. Global cardiac size within normal limits. No pericardial effusion. Central pulmonary arteries are of normal caliber. Mild atherosclerotic calcification within the thoracic aorta. No aortic aneurysm. Mediastinum/Nodes: 20 mm exophytic thyroid nodule versus parathyroid adenoma is seen adjacent to the a lower pole of the right thyroid gland., best seen on coronal image # 82/4. This would be better assessed with dedicated thyroid sonography, however, in the setting of significant comorbidities or limited life expectancy, no follow-up recommended (ref: J Am Coll Radiol. 2015 Feb;12(2): 143-50).Shotty right axillary lymphadenopathy is nonspecific and may be reactive in nature. No frankly pathologic thoracic adenopathy identified. The esophagus is unremarkable. Lungs/Pleura: Severe emphysema with right apical  bulla formation. Mild elevation of the left hemidiaphragm with adjacent left lower lobe rounded atelectasis. No superimposed confluent pulmonary infiltrate. No pneumothorax or pleural effusion.  Musculoskeletal: Osseous structures appear diffusely osteopenic. No focal lytic or blastic bone lesions are seen. No acute bone abnormality. CT ABDOMEN PELVIS FINDINGS Hepatobiliary: No focal liver abnormality is seen. No gallstones, gallbladder wall thickening, or biliary dilatation. Pancreas: Unremarkable Spleen: Unremarkable Adrenals/Urinary Tract: The adrenal glands are unremarkable. The kidneys are normal in size and position. Multiple simple cortical cysts are seen within the kidneys bilaterally. No follow-up imaging is recommended for these lesions. No enhancing intrarenal mass identified. No hydronephrosis. No intrarenal or ureteral calculi. A 2.7 cm rounded bladder calculus is seen dependently within the bladder lumen. There is superimposed circumferential bladder wall thickening, more severe along the posterior wall which may relate to chronic infectious or inflammatory cystitis. The bladder is not distended. Stomach/Bowel: Moderate stool within the rectosigmoid colon without evidence of obstruction. The stomach, small bowel, and large bowel are otherwise unremarkable. No free intraperitoneal gas or fluid. Vascular/Lymphatic: Aortic atherosclerosis. No enlarged abdominal or pelvic lymph nodes. Reproductive: Mild prostatic enlargement. Other: No abdominal wall hernia Musculoskeletal: The osseous structures are diffusely osteopenic. No focal lytic or blastic bone lesions are identified. IMPRESSION: 1. No acute intrathoracic or intra-abdominal pathology identified. No definite radiographic explanation for the patient's reported symptoms. 2. Severe emphysema with right apical bulla formation. 3. 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 Dedicated  thyroid sonography and correlation with serum parathyroid hormone level and thyroid function test may be helpful for further evaluation. 4. 2.7 cm bladder calculus. Superimposed circumferential bladder wall thickening may relate to superimposed chronic infectious or inflammatory cystitis. Correlation with urinalysis and urine culture may be helpful. 5. Moderate stool within the rectosigmoid colon without evidence of obstruction. 6. Mild prostatic enlargement. Emphysema (ICD10-J43.9). Electronically Signed   By: Fidela Salisbury M.D.   On: 08/03/2022 23:22    Microbiology: Results for orders placed or performed during the hospital encounter of 08/03/22  Urine Culture     Status: Abnormal   Collection Time: 08/03/22  9:29 PM   Specimen: Urine, Clean Catch  Result Value Ref Range Status   Specimen Description   Final    URINE, CLEAN CATCH Performed at [facility], [address] [place], [place] [address]    Special Requests   Final    NONE Performed at [facility], [address], [place], [place] [address]    Culture >=100,000 COLONIES/mL PROTEUS MIRABILIS (A)  Final   Report Status 08/06/2022 FINAL  Final   Organism ID, Bacteria PROTEUS MIRABILIS (A)  Final      Susceptibility   Proteus mirabilis - MIC*    AMPICILLIN <=2 SENSITIVE Sensitive     CEFAZOLIN <=4 SENSITIVE Sensitive     CEFEPIME <=0.12 SENSITIVE Sensitive     CEFTRIAXONE <=0.25 SENSITIVE Sensitive     CIPROFLOXACIN <=0.25 SENSITIVE Sensitive     GENTAMICIN <=1 SENSITIVE Sensitive     IMIPENEM 2 SENSITIVE Sensitive     NITROFURANTOIN RESISTANT Resistant     TRIMETH/SULFA <=20 SENSITIVE Sensitive     AMPICILLIN/SULBACTAM <=2 SENSITIVE Sensitive     PIP/TAZO <=4 SENSITIVE Sensitive     * >=100,000 COLONIES/mL PROTEUS MIRABILIS    Labs: CBC: Recent Labs  Lab 08/03/22 1945 08/04/22 0546 08/07/22 0507  WBC 3.6* 2.9* 5.4  NEUTROABS 1.8 1.3*  --   HGB 14.9 14.0 13.4  HCT 46.8 44.1 41.9  MCV 91.1 91.9 91.3  PLT 85*  83* 503*   Basic Metabolic Panel: Recent Labs  Lab 08/04/22  0240 08/05/22 0446 08/06/22 9735 08/07/22 0507 08/08/22 0431 08/09/22 0557 08/10/22 0544  NA 139   < > 143 143 147* 141 138  K 3.8   < > 3.8 3.8 4.0 3.9 4.0  CL 107   < > 116* 114* 120* 114* 111  CO2 29   < > '22 26 27 24 25  '$ GLUCOSE 85   < > 123* 92 105* 100* 87  BUN 14   < > '23 20 17 14 13  '$ CREATININE 1.39*   < > 1.40* 1.37* 1.21 1.14 1.14  CALCIUM 12.7*  12.9*   < > 13.8* 12.6* 12.7* 11.9* 11.2*  MG 1.8  --  2.0 1.8  --   --   --   PHOS  --   --   --  2.0*  --   --   --    < > = values in this interval not displayed.   Liver Function Tests: Recent Labs  Lab 08/03/22 1945 08/04/22 0546 08/05/22 0446 08/07/22 0507 08/09/22 0557 08/10/22 0544  AST '20 21 19  '$ --  38 36  ALT '18 17 17  '$ --  41 37  ALKPHOS 51 51 48  --  46 43  BILITOT 0.7 0.8 0.6  --  0.4 0.5  PROT 7.3 7.2 6.6  --  7.3 7.0  ALBUMIN 3.3* 3.2* 2.9* 2.9* 3.0* 2.8*   CBG: Recent Labs  Lab 08/05/22 0739  GLUCAP 97    Discharge time spent: 50  minutes.  Signed: Jani Gravel, MD [facility] 08/10/2022

## 2022-08-10 NOTE — Discharge Instructions (Addendum)
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

## 2022-08-10 NOTE — Plan of Care (Signed)

## 2022-08-14 ENCOUNTER — Ambulatory Visit: Payer: Medicare Other | Admitting: Family

## 2022-08-15 ENCOUNTER — Ambulatory Visit: Payer: Medicare Other | Admitting: Family

## 2022-08-19 ENCOUNTER — Other Ambulatory Visit (HOSPITAL_COMMUNITY): Payer: Self-pay

## 2022-08-22 ENCOUNTER — Ambulatory Visit (INDEPENDENT_AMBULATORY_CARE_PROVIDER_SITE_OTHER): Payer: Medicare Other | Admitting: Family

## 2022-08-22 ENCOUNTER — Encounter: Payer: Self-pay | Admitting: Family

## 2022-08-22 ENCOUNTER — Other Ambulatory Visit: Payer: Self-pay

## 2022-08-22 VITALS — BP 107/79 | HR 112 | Temp 98.2°F

## 2022-08-22 DIAGNOSIS — R627 Adult failure to thrive: Secondary | ICD-10-CM

## 2022-08-22 DIAGNOSIS — B20 Human immunodeficiency virus [HIV] disease: Secondary | ICD-10-CM

## 2022-08-22 DIAGNOSIS — Z0000 Encounter for general adult medical examination without abnormal findings: Secondary | ICD-10-CM | POA: Diagnosis not present

## 2022-08-22 MED ORDER — SYMTUZA 800-150-200-10 MG PO TABS: 1.0000 | ORAL_TABLET | Freq: Every day | ORAL | 2 refills | Status: DC

## 2022-08-22 NOTE — Assessment & Plan Note (Signed)
   Discussed importance of safe sexual practice and condom use.   Due for Shingrix and influenza (when available) and would benefit from RSV vaccine once able to receive it.

## 2022-08-22 NOTE — Assessment & Plan Note (Addendum)
Has failure to thrive which appears to be improving now at the skilled facility. Restarting HIV medication should help. Have encouraged protein intake. Says he has good access to food at home. Continue to monitor.

## 2022-08-22 NOTE — Patient Instructions (Addendum)
Nice to see you.  We have requested to restart taking your Symtuza.  Medication has been sent to [facility].   No lab work today.  Make sure to eat plenty of protein.   Plan follow up in 1 month or sooner if needed.   Have a great day and stay safe!

## 2022-08-22 NOTE — Assessment & Plan Note (Addendum)
[name] likely has poor controlled virus secondary to his medication being on hold at his skilled facility since leaving the hospital. Reviewed previous lab work and the plan of care. Restart taking Symtuza. Refills sent to [facility] which was listed on paperwork. Reminded to contact clinic if having transportation difficulties as we have resources that may be able to assist. No lab work today. Follow up in 1 month or sooner if needed with lab work on the same day.

## 2022-08-22 NOTE — Progress Notes (Signed)
Brief Narrative   [name], male    DOB: 07/04/1951, 71 y.o.   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 Initial viral load, CD4 and Genosure are unavailable. Initial clinic viral load was 161,000 and CD4 count 9 on 06/21/04. History of PCP. ART experienced with Kaletra, Combivir, Prezista/ritonavir, Truvada and now Symtuza.   Subjective:    Chief Complaint  Patient presents with   Follow-up   HIV Positive/AIDS    HPI:  [name] is a 71 y.o. male with AIDS/HIV disease last seen on 03/20/21 with well controlled virus and good adherence and tolerance to his ART regimen of Symtuza. Viral load was undetectable and CD4 count 280. Recently admitted to the hospital and found to have multiple medical issues and was discharged to a skilled facility. Here today for follow up.  [name] had been off his medication for about 4-5 months secondary to having difficulty with transportation. Viral load was 38,000 with CD4 count of 238. Was restarted on Symtuza. Suspected Proteus UTI was treated with ceftriaxone. Was also found to have failure to thrive with generalized weakness.  Per records sent from [facility] it does not appear that he has been receiving his medication and it has been on hold for the past 10+ days. Feeling okay today and frustrated that he has lost so much weight as he was doing good. Continued to live independently prior to this. Had issues with transportation which did not allow him to have follow up. Stable housing and good access to food. Denies fevers, chills, night sweats, headaches, changes in vision, neck pain/stiffness, nausea, diarrhea, vomiting, lesions or rashes.    Allergies  Allergen Reactions   Penicillins Hives    Has patient had a PCN reaction causing immediate rash, facial/tongue/throat swelling, SOB or lightheadedness with hypotension: No Has patient had a PCN  reaction causing severe rash involving mucus membranes or skin necrosis: No Has patient had a PCN reaction that required hospitalization: No Has patient had a PCN reaction occurring within the last 10 years: No If all of the above answers are "NO", then may proceed with Cephalosporin use.       Outpatient Medications Prior to Visit  Medication Sig Dispense Refill   acetaminophen (TYLENOL) 325 MG tablet Take 2 tablets (650 mg total) by mouth every 6 (six) hours as needed for mild pain (or Fever >/= 101). 30 tablet 0   feeding supplement (ENSURE ENLIVE / ENSURE PLUS) LIQD Take 237 mLs by mouth 3 (three) times daily between meals. 237 mL 12   ondansetron (ZOFRAN) 4 MG tablet Take 1 tablet (4 mg total) by mouth every 6 (six) hours as needed for nausea. 20 tablet 0   Darunavir-Cobicistat-Emtricitabine-Tenofovir Alafenamide (SYMTUZA) 800-150-200-10 MG TABS TAKE 1 TABLET BY MOUTH DAILY. 30 tablet 0   No facility-administered medications prior to visit.     Past Medical History:  Diagnosis Date   HIV (human immunodeficiency virus infection) ([place])      Past Surgical History:  Procedure Laterality Date   HAND SURGERY        Review of Systems  Constitutional:  Negative for appetite change, chills, fatigue, fever and unexpected weight change.  Eyes:  Negative for visual disturbance.  Respiratory:  Negative for cough, chest tightness, shortness of breath and wheezing.   Cardiovascular:  Negative for chest pain and leg swelling.  Gastrointestinal:  Negative for abdominal  pain, constipation, diarrhea, nausea and vomiting.  Genitourinary:  Negative for dysuria, flank pain, frequency, genital sores, hematuria and urgency.  Skin:  Negative for rash.  Allergic/Immunologic: Negative for immunocompromised state.  Neurological:  Negative for dizziness and headaches.      Objective:    BP 107/79   Pulse (!) 112   Temp 98.2 F (36.8 C) (Temporal)  Nursing note and vital signs  reviewed.  Physical Exam Constitutional:      General: He is not in acute distress.    Appearance: He is well-developed and underweight.  Eyes:     Conjunctiva/sclera: Conjunctivae normal.  Cardiovascular:     Rate and Rhythm: Normal rate and regular rhythm.     Heart sounds: Normal heart sounds. No murmur heard.    No friction rub. No gallop.  Pulmonary:     Effort: Pulmonary effort is normal. No respiratory distress.     Breath sounds: Normal breath sounds. No wheezing or rales.  Chest:     Chest wall: No tenderness.  Abdominal:     General: Bowel sounds are normal.     Palpations: Abdomen is soft.     Tenderness: There is no abdominal tenderness.  Musculoskeletal:     Cervical back: Neck supple.  Lymphadenopathy:     Cervical: No cervical adenopathy.  Skin:    General: Skin is warm and dry.     Findings: No rash.  Neurological:     Mental Status: He is alert and oriented to person, place, and time.  Psychiatric:        Behavior: Behavior normal.        Thought Content: Thought content normal.        Judgment: Judgment normal.         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   Failure to thrive in adult 03/00/9233   Acute metabolic encephalopathy 00/76/2263   Hypercalcemia 08/04/2022   UTI (urinary tract infection) 08/04/2022   Dysarthria 08/04/2022   Urinary incontinence    Fracture, Colles, right, closed 01/20/2020   Healthcare maintenance 06/17/2019   Tobacco use 06/17/2019   Infestation by bed bug 11/02/2018   Underweight 11/02/2018   ERECTILE DYSFUNCTION 04/13/2008   Unspecified glaucoma  08/19/2007   DENTAL CARIES 08/19/2007   AIDS (acquired immune deficiency syndrome) ([place]) 10/22/2006   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   PNEUMOCYSTIS PNEUMONIA 10/22/2006   COPD 10/22/2006   SEIZURE DISORDER 10/22/2006     Problem List Items Addressed This Visit       Other   AIDS (acquired immune deficiency syndrome) [facility])    Mr. Lamp likely has poor controlled virus secondary to his medication being on hold at his skilled facility since leaving the hospital. Reviewed previous lab work and the plan of care. Restart taking Symtuza. Refills sent to [facility] which was listed on paperwork. Reminded to contact clinic if having transportation difficulties as we have resources that may be able to assist. No lab work today. Follow  up in 1 month or sooner if needed with lab work on the same day.       Relevant Medications   Darunavir-Cobicistat-Emtricitabine-Tenofovir Alafenamide (SYMTUZA) 800-150-200-10 MG TABS   Healthcare maintenance    Discussed importance of safe sexual practice and condom use.  Due for Shingrix and influenza (when available) and would benefit from RSV vaccine once able to receive it.        Failure to thrive in adult    Has failure to thrive which appears to be improving now at the skilled facility. Restarting HIV medication should help. Have encouraged protein intake. Says he has good access to food at home. Continue to monitor.         I am having Greene Weinfeld maintain his ondansetron, feeding supplement, acetaminophen, and Symtuza.   Meds ordered this encounter  Medications   Darunavir-Cobicistat-Emtricitabine-Tenofovir Alafenamide (SYMTUZA) 800-150-200-10 MG TABS    Sig: Take 1 tablet by mouth daily.    Dispense:  30 tablet    Refill:  2    Order Specific Question:   Supervising Provider    Answer:   Carlyle Basques [[id number]]     Follow-up: Return in about 1 month (around 09/22/2022), or if symptoms worsen or fail to improve.   Terri Piedra, MSN, FNP-C Nurse Practitioner [facility] [place] number: [phone]

## 2022-08-26 ENCOUNTER — Other Ambulatory Visit (HOSPITAL_COMMUNITY): Payer: Self-pay

## 2022-09-12 ENCOUNTER — Ambulatory Visit: Payer: Self-pay | Admitting: Surgery

## 2022-09-13 ENCOUNTER — Encounter: Payer: Self-pay | Admitting: Family

## 2022-09-24 NOTE — Progress Notes (Unsigned)
Brief Narrative   [name], male    DOB: 03-15-1951, 71 y.o.   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 Initial viral load, CD4 and Genosure are unavailable. Initial clinic viral load was 161,000 and CD4 count 9 on 06/21/04. History of PCP. ART experienced with Kaletra, Combivir, Prezista/ritonavir, Truvada and now Symtuza.    Subjective:    Chief Complaint  Patient presents with   Follow-up    B20     HPI:  [name] is a 71 y.o. male with HIV/AIDS last seen on 08/22/22 having been off medication for about 4-5 months. Viral load was 38,000 with CD4 count of 238. Restarted on Symtuza. Here today for routine follow up.   [name] has been doing well since his last office visit although been having some problems with his vision that is effecting his ability to complete his activities of daily living including eating. Feels like he is slowly getting stronger. Continues to receive his medication on a daily basis with no adverse side effects. Denies fevers, chills, night sweats, headaches, neck pain/stiffness, nausea, diarrhea, vomiting, lesions or rashes. Condoms offered. Scheduled to have parathyroid surgery with request for clearance from General Surgery.     Allergies  Allergen Reactions   Penicillins Hives    Has patient had a PCN reaction causing immediate rash, facial/tongue/throat swelling, SOB or lightheadedness with hypotension: No Has patient had a PCN reaction causing severe rash involving mucus membranes or skin necrosis: No Has patient had a PCN reaction that required hospitalization: No Has patient had a PCN reaction occurring within the last 10 years: No If all of the above answers are "NO", then may proceed with Cephalosporin use.       Outpatient Medications Prior to Visit  Medication Sig Dispense Refill   acetaminophen (TYLENOL) 325 MG tablet Take 2 tablets (650 mg total) by  mouth every 6 (six) hours as needed for mild pain (or Fever >/= 101). 30 tablet 0   feeding supplement (ENSURE ENLIVE / ENSURE PLUS) LIQD Take 237 mLs by mouth 3 (three) times daily between meals. 237 mL 12   ondansetron (ZOFRAN) 4 MG tablet Take 1 tablet (4 mg total) by mouth every 6 (six) hours as needed for nausea. 20 tablet 0   Darunavir-Cobicistat-Emtricitabine-Tenofovir Alafenamide (SYMTUZA) 800-150-200-10 MG TABS Take 1 tablet by mouth daily. 30 tablet 2   No facility-administered medications prior to visit.     Past Medical History:  Diagnosis Date   HIV (human immunodeficiency virus infection) ([place])      Past Surgical History:  Procedure Laterality Date   HAND SURGERY        Review of Systems  Constitutional:  Negative for appetite change, chills, fatigue, fever and unexpected weight change.  Eyes:  Negative for visual disturbance.  Respiratory:  Negative for cough, chest tightness, shortness of breath and wheezing.   Cardiovascular:  Negative for chest pain and leg swelling.  Gastrointestinal:  Negative for abdominal pain, constipation, diarrhea, nausea and vomiting.  Genitourinary:  Negative for dysuria, flank pain, frequency, genital sores, hematuria and urgency.  Skin:  Negative for rash.  Allergic/Immunologic: Negative for immunocompromised state.  Neurological:  Negative for dizziness and headaches.      Objective:    BP 122/89   Pulse 93   Temp 97.9 F (36.6 C) (Oral)   Resp 16  Nursing note and vital signs reviewed.  Physical Exam Constitutional:      General: He is not in acute distress.    Appearance: He is well-developed.  Eyes:     Conjunctiva/sclera: Conjunctivae normal.  Cardiovascular:     Rate and Rhythm: Normal rate and regular rhythm.     Heart sounds: Normal heart sounds. No murmur heard.    No friction rub. No gallop.  Pulmonary:     Effort: Pulmonary effort is normal. No respiratory distress.     Breath sounds: Normal breath sounds.  No wheezing or rales.  Chest:     Chest wall: No tenderness.  Abdominal:     General: Bowel sounds are normal.     Palpations: Abdomen is soft.     Tenderness: There is no abdominal tenderness.  Musculoskeletal:     Cervical back: Neck supple.  Lymphadenopathy:     Cervical: No cervical adenopathy.  Skin:    General: Skin is warm and dry.     Findings: No rash.  Neurological:     Mental Status: He is alert and oriented to person, place, and time.  Psychiatric:        Behavior: Behavior normal.        Thought Content: Thought content normal.        Judgment: Judgment normal.         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   Failure to thrive in adult 82/99/3716   Acute metabolic encephalopathy 96/78/9381   Hypercalcemia 08/04/2022   UTI (urinary tract infection) 08/04/2022   Dysarthria 08/04/2022   Urinary incontinence    Fracture, Colles, right, closed 01/20/2020   Healthcare maintenance 06/17/2019   Tobacco use 06/17/2019   Infestation by bed bug 11/02/2018   Underweight 11/02/2018   ERECTILE DYSFUNCTION 04/13/2008   Unspecified glaucoma 08/19/2007   DENTAL CARIES 08/19/2007   AIDS (acquired immune deficiency syndrome) ([place]) 10/22/2006   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 Reviewed previous lab work and discussed plan of  care. Check lab work today to ensure viral suppression. Continue current dose of Symtuza. Have faxed clearance for surgery to General Surgery. Plan for follow up 3 months or sooner if needed with lab work on the same day.       Relevant Medications   Darunavir-Cobicistat-Emtricitabine-Tenofovir Alafenamide (SYMTUZA) 800-150-200-10 MG TABS   Other Relevant Orders   T-helper cell (CD4)- ([facility] clinic only)   HIV-1 RNA quant-no reflex-bld   Comprehensive metabolic panel   RPR   Healthcare maintenance    Discussed importance of safe sexual practice and condom use. Condoms and STD testing offered.  Declines influenza vaccination.       Vision changes  History of glaucoma and have recommended follow up with ophthalmologist.         I am having Shelly Coss. Albea maintain his ondansetron, feeding supplement, acetaminophen, and Symtuza.   Meds ordered this encounter  Medications   Darunavir-Cobicistat-Emtricitabine-Tenofovir Alafenamide (SYMTUZA) 800-150-200-10 MG TABS    Sig: Take 1 tablet by mouth daily.    Dispense:  30 tablet    Refill:  5    Order Specific Question:   Supervising Provider    Answer:   Carlyle Basques [[id number]]     Follow-up: Return in about 3 months (around 12/25/2022), or if symptoms worsen or fail to improve.   Terri Piedra, MSN, FNP-C Nurse Practitioner [facility] [place] number: [phone]

## 2022-09-25 ENCOUNTER — Encounter: Payer: Self-pay | Admitting: Family

## 2022-09-25 ENCOUNTER — Ambulatory Visit (INDEPENDENT_AMBULATORY_CARE_PROVIDER_SITE_OTHER): Payer: Medicare Other | Admitting: Family

## 2022-09-25 ENCOUNTER — Other Ambulatory Visit: Payer: Self-pay

## 2022-09-25 VITALS — BP 122/89 | HR 93 | Temp 97.9°F | Resp 16

## 2022-09-25 DIAGNOSIS — B20 Human immunodeficiency virus [HIV] disease: Secondary | ICD-10-CM

## 2022-09-25 DIAGNOSIS — Z0000 Encounter for general adult medical examination without abnormal findings: Secondary | ICD-10-CM | POA: Diagnosis not present

## 2022-09-25 DIAGNOSIS — H539 Unspecified visual disturbance: Secondary | ICD-10-CM | POA: Diagnosis not present

## 2022-09-25 MED ORDER — SYMTUZA 800-150-200-10 MG PO TABS: 1.0000 | ORAL_TABLET | Freq: Every day | ORAL | 5 refills | Status: DC

## 2022-09-25 NOTE — Patient Instructions (Signed)
Nice to see you.  We will check your lab work today.  Continue to take your medication daily as prescribed.  Refills have been sent to the pharmacy.  Plan for follow up in 3 months or sooner if needed with lab work on the same day.  Have a great day and stay safe!  

## 2022-09-25 NOTE — Assessment & Plan Note (Signed)
   Discussed importance of safe sexual practice and condom use. Condoms and STD testing offered.   Declines influenza vaccination.

## 2022-09-25 NOTE — Assessment & Plan Note (Signed)
History of glaucoma and have recommended follow up with ophthalmologist.

## 2022-09-25 NOTE — Assessment & Plan Note (Signed)
Mr. Teale has good adherence and tolerance to Symtuza. Reviewed previous lab work and discussed plan of care. Check lab work today to ensure viral suppression. Continue current dose of Symtuza. Have faxed clearance for surgery to General Surgery. Plan for follow up 3 months or sooner if needed with lab work on the same day.

## 2022-09-26 LAB — T-HELPER CELL (CD4) - (RCID CLINIC ONLY)
CD4 % Helper T Cell: 21 % — ABNORMAL LOW (ref 33–65)
CD4 T Cell Abs: 359 /uL — ABNORMAL LOW (ref 400–1790)

## 2022-09-28 LAB — COMPREHENSIVE METABOLIC PANEL
AG Ratio: 1.1 (calc) (ref 1.0–2.5)
ALT: 14 U/L (ref 9–46)
AST: 14 U/L (ref 10–35)
Albumin: 4 g/dL (ref 3.6–5.1)
Alkaline phosphatase (APISO): 66 U/L (ref 35–144)
BUN: 15 mg/dL (ref 7–25)
CO2: 22 mmol/L (ref 20–32)
Calcium: 12.8 mg/dL — ABNORMAL HIGH (ref 8.6–10.3)
Chloride: 105 mmol/L (ref 98–110)
Creat: 1.06 mg/dL (ref 0.70–1.28)
Globulin: 3.8 g/dL (calc) — ABNORMAL HIGH (ref 1.9–3.7)
Glucose, Bld: 80 mg/dL (ref 65–99)
Potassium: 5.1 mmol/L (ref 3.5–5.3)
Sodium: 137 mmol/L (ref 135–146)
Total Bilirubin: 0.5 mg/dL (ref 0.2–1.2)
Total Protein: 7.8 g/dL (ref 6.1–8.1)

## 2022-09-28 LAB — RPR: RPR Ser Ql: NONREACTIVE

## 2022-09-28 LAB — HIV-1 RNA QUANT-NO REFLEX-BLD
HIV 1 RNA Quant: 59 Copies/mL — ABNORMAL HIGH
HIV-1 RNA Quant, Log: 1.77 Log cps/mL — ABNORMAL HIGH

## 2022-10-01 ENCOUNTER — Telehealth: Payer: Self-pay

## 2022-10-01 NOTE — Telephone Encounter (Signed)
Attempted to call patient on home phone, no answer and no secure voicemail set up.   Called patient's nursing facility and asked to be transferred to patient's room. Call was transferred to an incorrect room.   Called facility back, no answer.   [place] phone: [phone]  Beryle Flock, RN

## 2022-10-02 NOTE — Telephone Encounter (Signed)
[place], FNP  10/01/2022 12:17 PM EDT Back to Top    Please inform [name] that his lab work looks good with viral load of 59 which is essentially undetectable an CD4 count 359. Kidney function, liver function and electrolytes were within normal ranges.    Attempted to contact the patient at his facility, patient's roommate answered and was unable to get the phone to [name].   Beryle Flock, RN

## 2022-12-31 ENCOUNTER — Ambulatory Visit (INDEPENDENT_AMBULATORY_CARE_PROVIDER_SITE_OTHER): Payer: Medicare Other | Admitting: Family

## 2022-12-31 ENCOUNTER — Encounter: Payer: Self-pay | Admitting: Family

## 2022-12-31 ENCOUNTER — Other Ambulatory Visit: Payer: Self-pay

## 2022-12-31 VITALS — BP 119/73 | HR 86 | Temp 97.8°F

## 2022-12-31 DIAGNOSIS — H539 Unspecified visual disturbance: Secondary | ICD-10-CM

## 2022-12-31 DIAGNOSIS — Z0000 Encounter for general adult medical examination without abnormal findings: Secondary | ICD-10-CM | POA: Diagnosis not present

## 2022-12-31 DIAGNOSIS — Z79899 Other long term (current) drug therapy: Secondary | ICD-10-CM

## 2022-12-31 DIAGNOSIS — B20 Human immunodeficiency virus [HIV] disease: Secondary | ICD-10-CM

## 2022-12-31 MED ORDER — SYMTUZA 800-150-200-10 MG PO TABS: 1.0000 | ORAL_TABLET | Freq: Every day | ORAL | 5 refills | Status: AC

## 2022-12-31 NOTE — Assessment & Plan Note (Signed)
[name] continues to have issues with his vision in the setting of previously diagnosed glaucoma. Referral placed to ophthalmology for further evaluation and treatment.

## 2022-12-31 NOTE — Progress Notes (Signed)
Brief Narrative   [name], male    DOB: 02/06/1951, 72 y.o.   MRN: [id number]  [name] is a 72 y/o AA male with HIV/AIDS with risk factor of MSM. Initial viral load, CD4 and Genosure are unavailable. Initial clinic viral load was 161,000 and CD4 count 9 on 06/21/04. History of PCP. ART experienced with Kaletra, Combivir, Prezista/ritonavir, Truvada and now Symtuza.    Subjective:    Chief Complaint  Patient presents with   Follow-up    B20     HPI:  [name] is a 72 y.o. male with HIV/AIDS last seen on 09/25/2022 with well-controlled virus and good adherence and tolerance to Symtuza.  Viral load was 59 with CD4 count of 359.  Kidney function, liver function, electrolytes within normal ranges.  RPR was nonreactive for syphilis.  Here today for routine follow-up.  [name] has been doing well since his last office visit. Continues to take his Symtuza as prescribed with no adverse side effects. Living in [facility] Does have some trouble with his vision and thinks he may need bifocals. No new concerns/complaints. Received influenza vaccination already. Condoms and STD testing offered.  Denies fevers, chills, night sweats, headaches, changes in vision, neck pain/stiffness, nausea, diarrhea, vomiting, lesions or rashes.     Allergies  Allergen Reactions   Penicillins Hives    Has patient had a PCN reaction causing immediate rash, facial/tongue/throat swelling, SOB or lightheadedness with hypotension: No Has patient had a PCN reaction causing severe rash involving mucus membranes or skin necrosis: No Has patient had a PCN reaction that required hospitalization: No Has patient had a PCN reaction occurring within the last 10 years: No If all of the above answers are "NO", then may proceed with Cephalosporin use.       Outpatient Medications Prior to Visit  Medication Sig Dispense Refill   acetaminophen (TYLENOL) 325  MG tablet Take 2 tablets (650 mg total) by mouth every 6 (six) hours as needed for mild pain (or Fever >/= 101). 30 tablet 0   feeding supplement (ENSURE ENLIVE / ENSURE PLUS) LIQD Take 237 mLs by mouth 3 (three) times daily between meals. 237 mL 12   ondansetron (ZOFRAN) 4 MG tablet Take 1 tablet (4 mg total) by mouth every 6 (six) hours as needed for nausea. 20 tablet 0   Darunavir-Cobicistat-Emtricitabine-Tenofovir Alafenamide (SYMTUZA) 800-150-200-10 MG TABS Take 1 tablet by mouth daily. 30 tablet 5   No facility-administered medications prior to visit.     Past Medical History:  Diagnosis Date   HIV (human immunodeficiency virus infection) ([place])      Past Surgical History:  Procedure Laterality Date   HAND SURGERY        Review of Systems  Constitutional:  Negative for appetite change, chills, fatigue, fever and unexpected weight change.  Eyes:  Negative for visual disturbance.  Respiratory:  Negative for cough, chest tightness, shortness of breath and wheezing.   Cardiovascular:  Negative for chest pain and leg swelling.  Gastrointestinal:  Negative for abdominal pain, constipation, diarrhea, nausea and vomiting.  Genitourinary:  Negative for dysuria, flank pain, frequency, genital sores, hematuria and urgency.  Skin:  Negative for rash.  Allergic/Immunologic: Negative for immunocompromised state.  Neurological:  Negative for dizziness and headaches.      Objective:    BP 119/73   Pulse 86   Temp 97.8 F (36.6 C) (Oral)  Nursing note and vital signs reviewed.  Physical Exam Constitutional:      General: He is not in acute distress.    Appearance: He is well-developed.  Eyes:     Conjunctiva/sclera: Conjunctivae normal.  Cardiovascular:     Rate and Rhythm: Normal rate and regular rhythm.     Heart sounds: Normal heart sounds. No murmur heard.    No friction rub. No gallop.  Pulmonary:     Effort: Pulmonary effort is normal. No respiratory distress.      Breath sounds: Normal breath sounds. No wheezing or rales.  Chest:     Chest wall: No tenderness.  Abdominal:     General: Bowel sounds are normal.     Palpations: Abdomen is soft.     Tenderness: There is no abdominal tenderness.  Musculoskeletal:     Cervical back: Neck supple.  Lymphadenopathy:     Cervical: No cervical adenopathy.  Skin:    General: Skin is warm and dry.     Findings: No rash.  Neurological:     Mental Status: He is alert and oriented to person, place, and time.  Psychiatric:        Behavior: Behavior normal.        Thought Content: Thought content normal.        Judgment: Judgment normal.         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   Failure to thrive in adult 16/09/9603   Acute metabolic encephalopathy 54/08/8118   Hypercalcemia 08/04/2022   UTI (urinary tract infection) 08/04/2022   Dysarthria 08/04/2022   Urinary incontinence    Fracture, Colles, right, closed 01/20/2020   Healthcare maintenance 06/17/2019   Tobacco use 06/17/2019   Infestation by bed bug 11/02/2018   Underweight 11/02/2018   ERECTILE DYSFUNCTION 04/13/2008   Unspecified glaucoma 08/19/2007   DENTAL CARIES 08/19/2007   AIDS (acquired immune deficiency syndrome) ([place]) 10/22/2006   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   PNEUMOCYSTIS PNEUMONIA 10/22/2006   COPD 10/22/2006   SEIZURE DISORDER 10/22/2006     Problem List Items Addressed This Visit       Other   AIDS (acquired immune deficiency syndrome) ([place]) - Primary    [name] continues to have well controlled virus with good adherence and  tolerance to [employer]. Reviewed previous lab work and discussed plan of care. This is honestly the best I have seen him in quite some time. Check lab work. Continue current dose of Symtuza. Plan for follow up in 6 months or sooner if needed with lab work on the same day.       Relevant Medications   Darunavir-Cobicistat-Emtricitabine-Tenofovir Alafenamide (SYMTUZA) 800-150-200-10 MG TABS   Other Relevant Orders   BASIC METABOLIC PANEL WITH GFR   HIV-1 RNA quant-no reflex-bld   T-helper cell (CD4)- ([facility] clinic only)   Healthcare maintenance    Discussed importance of safe sexual practice and condom use. Condoms and STD testing offered.  Influenza vaccination up to date. Will discuss RSV  and Shingrix at next office visit.       Bridgman continues to have issues with his vision in the setting of previously diagnosed glaucoma. Referral placed to ophthalmology for further evaluation and treatment.       Relevant Orders   Ambulatory referral to Ophthalmology   Other Visit Diagnoses     Pharmacologic therapy       Relevant Orders   Lipid panel        I am having Shelly Coss. [place] maintain his ondansetron, feeding supplement, acetaminophen, and Symtuza.   Meds ordered this encounter  Medications   Darunavir-Cobicistat-Emtricitabine-Tenofovir Alafenamide (SYMTUZA) 800-150-200-10 MG TABS    Sig: Take 1 tablet by mouth daily.    Dispense:  30 tablet    Refill:  5    Order Specific Question:   Supervising Provider    Answer:   Carlyle Basques [[id number]]     Follow-up: Return in about 6 months (around 07/01/2023), or if symptoms worsen or fail to improve.   Terri Piedra, MSN, FNP-C Nurse Practitioner [facility] [place] number: [phone]

## 2022-12-31 NOTE — Assessment & Plan Note (Signed)
Discussed importance of safe sexual practice and condom use. Condoms and STD testing offered.  Influenza vaccination up to date. Will discuss RSV and Shingrix at next office visit.

## 2022-12-31 NOTE — Addendum Note (Signed)
Addended by: Caffie Pinto on: 12/31/2022 10:38 AM   Modules accepted: Orders

## 2022-12-31 NOTE — Patient Instructions (Signed)
Nice to see you. ? ?We will check your lab work today. ? ?Continue to take your medication daily as prescribed. ? ?Refills have been sent to the pharmacy. ? ?Plan for follow up in 6 months or sooner if needed with lab work on the same day. ? ?Have a great day and stay safe! ? ?

## 2022-12-31 NOTE — Assessment & Plan Note (Signed)
Martice continues to have well controlled virus with good adherence and tolerance to [employer]. Reviewed previous lab work and discussed plan of care. This is honestly the best I have seen him in quite some time. Check lab work. Continue current dose of Symtuza. Plan for follow up in 6 months or sooner if needed with lab work on the same day.

## 2023-01-01 LAB — BASIC METABOLIC PANEL WITH GFR
BUN: 18 mg/dL (ref 7–25)
CO2: 22 mmol/L (ref 20–32)
Calcium: 12.9 mg/dL — ABNORMAL HIGH (ref 8.6–10.3)
Chloride: 105 mmol/L (ref 98–110)
Creat: 1.12 mg/dL (ref 0.70–1.28)
Glucose, Bld: 76 mg/dL (ref 65–99)
Potassium: 4.7 mmol/L (ref 3.5–5.3)
Sodium: 137 mmol/L (ref 135–146)
eGFR: 70 mL/min/{1.73_m2} (ref >=60)

## 2023-01-01 LAB — T-HELPER CELL (CD4) - (RCID CLINIC ONLY)
CD4 % Helper T Cell: 22 % — ABNORMAL LOW (ref 33–65)
CD4 T Cell Abs: 460 /uL (ref 400–1790)

## 2023-01-03 ENCOUNTER — Other Ambulatory Visit: Payer: Medicare Other

## 2023-01-07 ENCOUNTER — Other Ambulatory Visit: Payer: Self-pay

## 2023-01-07 ENCOUNTER — Other Ambulatory Visit: Payer: Medicare Other

## 2023-01-07 DIAGNOSIS — B20 Human immunodeficiency virus [HIV] disease: Secondary | ICD-10-CM

## 2023-01-09 LAB — HIV-1 RNA QUANT-NO REFLEX-BLD
HIV 1 RNA Quant: <20 Copies/mL — ABNORMAL HIGH
HIV-1 RNA Quant, Log: <1.3 Log cps/mL — ABNORMAL HIGH

## 2023-01-10 ENCOUNTER — Telehealth: Payer: Self-pay

## 2023-01-10 NOTE — Telephone Encounter (Signed)
-----  Message from [place], [place] sent at 01/10/2023 11:50 AM EST ----- Please inform Mr. Safley that his viral load is undetectable. Thanks.

## 2023-01-10 NOTE — Telephone Encounter (Signed)
Called patient to relay results, no answer. Left HIPAA compliant voicemail requesting callback.   Keysean Savino D Kaydin Labo, RN  

## 2023-01-13 NOTE — Telephone Encounter (Signed)
Left vm to call office

## 2023-03-14 ENCOUNTER — Other Ambulatory Visit (HOSPITAL_COMMUNITY): Payer: Self-pay

## 2023-06-11 NOTE — Progress Notes (Signed)
The 10-year ASCVD risk score (Arnett DK, et al., 2019) is: 18.8%   Values used to calculate the score:     Age: 72 years     Sex: Male     Is Non-Hispanic African American: Yes     Diabetic: No     Tobacco smoker: Yes     Systolic Blood Pressure: 119 mmHg     Is BP treated: No     HDL Cholesterol: 35 mg/dL     Total Cholesterol: 133 mg/dL  Sandie Ano, RN

## 2023-07-07 ENCOUNTER — Ambulatory Visit (INDEPENDENT_AMBULATORY_CARE_PROVIDER_SITE_OTHER): Payer: 59 | Admitting: Family

## 2023-07-07 ENCOUNTER — Encounter: Payer: Self-pay | Admitting: Family

## 2023-07-07 ENCOUNTER — Other Ambulatory Visit: Payer: Self-pay

## 2023-07-07 VITALS — BP 121/77 | HR 108 | Temp 97.0°F

## 2023-07-07 DIAGNOSIS — Z79899 Other long term (current) drug therapy: Secondary | ICD-10-CM

## 2023-07-07 DIAGNOSIS — Z0000 Encounter for general adult medical examination without abnormal findings: Secondary | ICD-10-CM

## 2023-07-07 DIAGNOSIS — B20 Human immunodeficiency virus [HIV] disease: Secondary | ICD-10-CM | POA: Diagnosis not present

## 2023-07-07 DIAGNOSIS — Z9189 Other specified personal risk factors, not elsewhere classified: Secondary | ICD-10-CM | POA: Diagnosis not present

## 2023-07-07 DIAGNOSIS — R300 Dysuria: Secondary | ICD-10-CM | POA: Diagnosis not present

## 2023-07-07 LAB — LIPID PANEL
LDL Cholesterol (Calc): 133 mg/dL (calc) — ABNORMAL HIGH
Non-HDL Cholesterol (Calc): 158 mg/dL (calc) — ABNORMAL HIGH (ref <130)
Total CHOL/HDL Ratio: 5.1 (calc) — ABNORMAL HIGH (ref <5.0)
Triglycerides: 130 mg/dL (ref <150)

## 2023-07-07 LAB — COMPLETE METABOLIC PANEL WITH GFR
AG Ratio: 1.2 (calc) (ref 1.0–2.5)
AST: 11 U/L (ref 10–35)
Alkaline phosphatase (APISO): 83 U/L (ref 35–144)
Chloride: 107 mmol/L (ref 98–110)
Globulin: 3.6 g/dL (calc) (ref 1.9–3.7)
Glucose, Bld: 92 mg/dL (ref 65–99)
Total Protein: 8 g/dL (ref 6.1–8.1)

## 2023-07-07 MED ORDER — SYMTUZA 800-150-200-10 MG PO TABS: 1.0000 | ORAL_TABLET | Freq: Every day | ORAL | 5 refills | Status: DC

## 2023-07-07 MED ORDER — ROSUVASTATIN CALCIUM 10 MG PO TABS: 10.0000 mg | ORAL_TABLET | Freq: Every day | ORAL | 5 refills | Status: DC

## 2023-07-07 NOTE — Progress Notes (Signed)
Brief Narrative   [name], male    DOB: July 03, 1951, 72 y.o.   MRN: [id number]  [name] is a 72 y/o AA male with HIV/AIDS with risk factor of MSM. Initial viral load, CD4 and Genosure are unavailable. Initial clinic viral load was 161,000 and CD4 count 9 on 06/21/04. History of PCP. ART experienced with Kaletra, Combivir, Prezista/ritonavir, Truvada and now Symtuza.    Subjective:    Chief Complaint  Patient presents with   HIV Positive/AIDS    HPI:  [name] is a 72 y.o. male with HIV disease last seen on 12/31/22 with well controlled virus and good adherence and tolerance to Symtuza. Viral load was undetectable and CD4 count 460. Kidney function, liver function and electrolytes within normal ranges. Here today for routine follow up.  [name] has been doing okay since his last office visit and remains a resident at [facility].  Continues to receive Symtuza with no adverse side effects or problems obtaining medication.  Has been having some dysuria recently with no other urinary symptoms and denies penile discharge, frequency, urgency, abdominal and/or back pain.  Facility staff has been providing Flomax.  Has increased ASCVD risk of 18%.  Healthcare maintenance due includes Shingrix which she declines.  Denies fevers, chills, night sweats, headaches, changes in vision, neck pain/stiffness, nausea, diarrhea, vomiting, lesions or rashes.   Allergies  Allergen Reactions   Penicillins Hives    Has patient had a PCN reaction causing immediate rash, facial/tongue/throat swelling, SOB or lightheadedness with hypotension: No Has patient had a PCN reaction causing severe rash involving mucus membranes or skin necrosis: No Has patient had a PCN reaction that required hospitalization: No Has patient had a PCN reaction occurring within the last 10 years: No If all of the above answers are "NO", then may proceed with Cephalosporin use.        Outpatient Medications Prior to Visit  Medication Sig Dispense Refill   acetaminophen (TYLENOL) 325 MG tablet Take 2 tablets (650 mg total) by mouth every 6 (six) hours as needed for mild pain (or Fever >/= 101). 30 tablet 0   ondansetron (ZOFRAN) 4 MG tablet Take 1 tablet (4 mg total) by mouth every 6 (six) hours as needed for nausea. 20 tablet 0   tamsulosin (FLOMAX) 0.4 MG CAPS capsule Take 0.4 mg by mouth daily.     Darunavir-Cobicistat-Emtricitabine-Tenofovir Alafenamide (SYMTUZA) 800-150-200-10 MG TABS Take 1 tablet by mouth daily.     feeding supplement (ENSURE ENLIVE / ENSURE PLUS) LIQD Take 237 mLs by mouth 3 (three) times daily between meals. (Patient not taking: Reported on 07/07/2023) 237 mL 12   No facility-administered medications prior to visit.     Past Medical History:  Diagnosis Date   HIV (human immunodeficiency virus infection) ([facility])      Past Surgical History:  Procedure Laterality Date   HAND SURGERY        Review of Systems  Constitutional:  Negative for appetite change, chills, fatigue, fever and unexpected weight change.  Eyes:  Negative for visual disturbance.  Respiratory:  Negative for cough, chest tightness, shortness of breath and wheezing.   Cardiovascular:  Negative for chest pain and leg swelling.  Gastrointestinal:  Negative for abdominal pain, constipation, diarrhea, nausea and vomiting.  Genitourinary:  Positive for dysuria. Negative for flank pain, frequency, genital sores, hematuria and urgency.  Skin:  Negative for rash.  Allergic/Immunologic: Negative for immunocompromised state.  Neurological:  Negative for  dizziness and headaches.      Objective:    BP 121/77   Pulse (!) 108   Temp (!) 97 F (36.1 C) (Temporal)  Nursing note and vital signs reviewed.  Physical Exam Constitutional:      General: He is not in acute distress.    Appearance: He is well-developed and underweight.  Cardiovascular:     Rate and Rhythm:  Normal rate and regular rhythm.     Heart sounds: Normal heart sounds.  Pulmonary:     Effort: Pulmonary effort is normal.     Breath sounds: Normal breath sounds.  Skin:    General: Skin is warm and dry.  Neurological:     Mental Status: He is alert and oriented to person, place, and time.         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   Failure to thrive in adult 08/04/2022   Acute metabolic encephalopathy 08/04/2022   Hypercalcemia 08/04/2022   UTI (urinary tract infection) 08/04/2022   Dysarthria 08/04/2022   Urinary incontinence    Fracture, Colles, right, closed 01/20/2020   Healthcare maintenance 06/17/2019   Tobacco use 06/17/2019   Infestation by bed bug 11/02/2018   Underweight 11/02/2018   ERECTILE DYSFUNCTION 04/13/2008   Unspecified glaucoma 08/19/2007   DENTAL CARIES 08/19/2007   AIDS (acquired immune deficiency syndrome) ([facility]) 10/22/2006   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   PNEUMOCYSTIS PNEUMONIA 10/22/2006   COPD 10/22/2006   SEIZURE DISORDER 10/22/2006     Problem List Items Addressed This Visit       Other   AIDS (acquired immune deficiency syndrome) ([facility]) - Primary    [name] continues to have well-controlled virus with good adherence and tolerance to Symtuza.  Reviewed previous lab work and discussed plan of care and U equals U.  Continue current dose of Symtuza.  Check blood work.  Plan for follow-up in 6 months or sooner if needed with lab work on the same day.      Relevant  Medications   Darunavir-Cobicistat-Emtricitabine-Tenofovir Alafenamide (SYMTUZA) 800-150-200-10 MG TABS   Other Relevant Orders   COMPLETE METABOLIC PANEL WITH GFR   HIV-1 RNA quant-no reflex-bld   T-helper cells (CD4) count (not at [facility])   Healthcare maintenance    Discussed importance of safe sexual practice and condom use. Condoms and STD testing offered.  Declines vaccinations.      Dysuria    [name] has new onset dysuria of unclear origin that only occurs with urination and no other urinary symptoms at present.  Has been treated with "pills" recently and is unclear what they are.  Will have primary team check UA for infection although it appears he has chronic urinary issues and may benefit from a urology visit.      At increased risk for cardiovascular disease    [name] is a creased risk for  cardiovascular disease with ASCVD risk of 18%.  Discussed recommendations to start medication to help reduce cholesterol as well as inflammation associated with HIV based on current research.  Start rosuvastatin.  Discussed potential side effects and to follow-up for any new onset muscle pains.      Other Visit Diagnoses     Pharmacologic therapy       Relevant Orders   Lipid panel        I am having [name] start on rosuvastatin. I am also having him maintain his ondansetron, feeding supplement, acetaminophen, tamsulosin, and Symtuza.   Meds ordered this encounter  Medications   Darunavir-Cobicistat-Emtricitabine-Tenofovir Alafenamide (SYMTUZA) 800-150-200-10 MG TABS    Sig: Take 1 tablet by mouth daily.    Dispense:  30 tablet    Refill:  5    Order Specific Question:   Supervising Provider    Answer:   Drue Second, CYNTHIA [[id number]]   rosuvastatin (CRESTOR) 10 MG tablet    Sig: Take 1 tablet (10 mg total) by mouth daily.    Dispense:  30 tablet    Refill:  5    Order Specific Question:   Supervising Provider    Answer:   Judyann Munson [[id number]]      Follow-up: Return in about 6 months (around 01/07/2024), or if symptoms worsen or fail to improve.   Marcos Eke, MSN, FNP-C Nurse Practitioner [facility] for Infectious Disease [facility] Main number: [phone]

## 2023-07-07 NOTE — Assessment & Plan Note (Signed)
[name] is a creased risk for cardiovascular disease with ASCVD risk of 18%.  Discussed recommendations to start medication to help reduce cholesterol as well as inflammation associated with HIV based on current research.  Start rosuvastatin.  Discussed potential side effects and to follow-up for any new onset muscle pains.

## 2023-07-07 NOTE — Assessment & Plan Note (Signed)
[name] continues to have well-controlled virus with good adherence and tolerance to Symtuza.  Reviewed previous lab work and discussed plan of care and U equals U.  Continue current dose of Symtuza.  Check blood work.  Plan for follow-up in 6 months or sooner if needed with lab work on the same day.

## 2023-07-07 NOTE — Patient Instructions (Addendum)
Nice to see you.  We will check your lab work today.  Continue to take your medication daily as prescribed.  Refills have been sent to the pharmacy.  We will start you on rosuvastatin for cholesterol and inflammation.   Plan for follow up in 6 months or sooner if needed with lab work on the same day.  Have a great day and stay safe!

## 2023-07-07 NOTE — Assessment & Plan Note (Signed)
Discussed importance of safe sexual practice and condom use. Condoms and STD testing offered.  Declines vaccinations.  

## 2023-07-07 NOTE — Assessment & Plan Note (Signed)
[name] has new onset dysuria of unclear origin that only occurs with urination and no other urinary symptoms at present.  Has been treated with "pills" recently and is unclear what they are.  Will have primary team check UA for infection although it appears he has chronic urinary issues and may benefit from a urology visit.

## 2023-07-08 LAB — T-HELPER CELLS (CD4) COUNT (NOT AT ARMC)
Absolute CD4: 583 cells/uL (ref 490–1740)
CD4 T Helper %: 20 % — ABNORMAL LOW (ref 30–61)
Total lymphocyte count: 2987 cells/uL (ref 850–3900)

## 2023-07-10 LAB — COMPLETE METABOLIC PANEL WITH GFR
ALT: 11 U/L (ref 9–46)
Albumin: 4.4 g/dL (ref 3.6–5.1)
BUN: 16 mg/dL (ref 7–25)
CO2: 26 mmol/L (ref 20–32)
Calcium: 13.7 mg/dL (ref 8.6–10.3)
Creat: 1.14 mg/dL (ref 0.70–1.28)
Potassium: 4 mmol/L (ref 3.5–5.3)
Sodium: 140 mmol/L (ref 135–146)
Total Bilirubin: 0.5 mg/dL (ref 0.2–1.2)
eGFR: 68 mL/min/{1.73_m2} (ref >=60)

## 2023-07-10 LAB — HIV-1 RNA QUANT-NO REFLEX-BLD
HIV 1 RNA Quant: <20 Copies/mL — ABNORMAL HIGH
HIV-1 RNA Quant, Log: <1.3 Log cps/mL — ABNORMAL HIGH

## 2023-07-10 LAB — LIPID PANEL
Cholesterol: 197 mg/dL (ref <200)
HDL: 39 mg/dL — ABNORMAL LOW (ref >=40)

## 2023-09-06 ENCOUNTER — Emergency Department (HOSPITAL_COMMUNITY): Payer: 59

## 2023-09-06 ENCOUNTER — Other Ambulatory Visit: Payer: Self-pay

## 2023-09-06 ENCOUNTER — Encounter (HOSPITAL_COMMUNITY): Payer: Self-pay

## 2023-09-06 ENCOUNTER — Emergency Department (HOSPITAL_COMMUNITY)
Admission: EM | Admit: 2023-09-06 | Discharge: 2023-09-07 | Disposition: A | Discharge status: Home / Self Care | Payer: 59 | Attending: Emergency Medicine | Admitting: Emergency Medicine

## 2023-09-06 DIAGNOSIS — J181 Lobar pneumonia, unspecified organism: Secondary | ICD-10-CM | POA: Diagnosis not present

## 2023-09-06 DIAGNOSIS — Z21 Asymptomatic human immunodeficiency virus [HIV] infection status: Secondary | ICD-10-CM | POA: Diagnosis not present

## 2023-09-06 DIAGNOSIS — R079 Chest pain, unspecified: Secondary | ICD-10-CM | POA: Diagnosis present

## 2023-09-06 DIAGNOSIS — J449 Chronic obstructive pulmonary disease, unspecified: Secondary | ICD-10-CM | POA: Insufficient documentation

## 2023-09-06 DIAGNOSIS — J189 Pneumonia, unspecified organism: Secondary | ICD-10-CM

## 2023-09-06 HISTORY — DX: Chronic obstructive pulmonary disease, unspecified: J44.9

## 2023-09-06 HISTORY — DX: Adult failure to thrive: R62.7

## 2023-09-06 HISTORY — DX: Dysphagia, unspecified: R13.10

## 2023-09-06 HISTORY — DX: Benign prostatic hyperplasia without lower urinary tract symptoms: N40.0

## 2023-09-06 HISTORY — DX: Unspecified visual loss: H54.7

## 2023-09-06 HISTORY — DX: Urinary tract infection, site not specified: N39.0

## 2023-09-06 HISTORY — DX: Tobacco use: Z72.0

## 2023-09-06 HISTORY — DX: Unspecified convulsions: R56.9

## 2023-09-06 HISTORY — DX: Vitamin D deficiency, unspecified: E55.9

## 2023-09-06 HISTORY — DX: Metabolic encephalopathy: G93.41

## 2023-09-06 LAB — CBC
HCT: 37.7 % — ABNORMAL LOW (ref 39.0–52.0)
Hemoglobin: 12.3 g/dL — ABNORMAL LOW (ref 13.0–17.0)
MCH: 30.2 pg (ref 26.0–34.0)
MCHC: 32.6 g/dL (ref 30.0–36.0)
MCV: 92.6 fL (ref 80.0–100.0)
Platelets: 199 10*3/uL (ref 150–400)
RBC: 4.07 MIL/uL — ABNORMAL LOW (ref 4.22–5.81)
RDW: 13.1 % (ref 11.5–15.5)
WBC: 8.8 10*3/uL (ref 4.0–10.5)
nRBC: 0 % (ref 0.0–0.2)

## 2023-09-06 LAB — BASIC METABOLIC PANEL
Anion gap: 5 (ref 5–15)
BUN: 24 mg/dL — ABNORMAL HIGH (ref 8–23)
CO2: 22 mmol/L (ref 22–32)
Calcium: 12.4 mg/dL — ABNORMAL HIGH (ref 8.9–10.3)
Chloride: 108 mmol/L (ref 98–111)
Creatinine, Ser: 1.22 mg/dL (ref 0.61–1.24)
GFR, Estimated: >60 mL/min (ref >60)
Glucose, Bld: 111 mg/dL — ABNORMAL HIGH (ref 70–99)
Potassium: 3.6 mmol/L (ref 3.5–5.1)
Sodium: 135 mmol/L (ref 135–145)

## 2023-09-06 LAB — TROPONIN I (HIGH SENSITIVITY)
Troponin I (High Sensitivity): 11 ng/L (ref <18)
Troponin I (High Sensitivity): 10 ng/L (ref <18)

## 2023-09-06 MED ORDER — LEVOFLOXACIN 500 MG PO TABS: 500.0000 mg | ORAL_TABLET | Freq: Every day | ORAL | 0 refills | Status: DC

## 2023-09-06 MED ORDER — LEVOFLOXACIN 500 MG PO TABS
500.0000 mg | ORAL_TABLET | Freq: Once | ORAL | Status: AC
  Administered 2023-09-06: 500 mg via ORAL
  Filled 2023-09-06: qty 1

## 2023-09-06 NOTE — ED Triage Notes (Signed)
Pt c/o central chest pain x 3 days . REMS gave pt 324 ASA. Pt able to swallow without issues. Pt came from [facility] and has MOST form at bedside with Full code. No n/v with pain.

## 2023-09-06 NOTE — Discharge Instructions (Signed)
Mr. [name] was seen in the emergency room for chest pain. His cardiac workup is normal.  No evidence of heart attack.  We recommend continued follow-up with primary care doctor for chest pain.  His x-ray is showing concerns for pneumonia, therefore we have ordered antibiotics.  He will receive his first dose of Levaquin in the ER, starting tomorrow he will need daily dose of Levaquin for 6 more days.  Please return to the ER if you have worsening chest pain, shortness of breath, pain radiating to your jaw, shoulder, or back, sweats or fainting. Otherwise see the Cardiologist or your primary care doctor as requested.

## 2023-09-06 NOTE — ED Provider Notes (Signed)
[place] EMERGENCY DEPARTMENT AT [facility] Provider Note   CSN: [id number] Arrival date & time: 09/06/23  4098     History  Chief Complaint  Patient presents with   Chest Pain    EMAAD DOBIAS is a 72 y.o. male.  HPI    Pt comes in with cc of chest pain. Chest pain x 2 days. Chest pain is pressure pain, fairly constant. No associated shob, nausea.  Patient is having cough, that is mostly nonproductive.  Patient currently resides at [facility] rehab facility.  Patient has past medical history of HIV, COPD.  He denies any known coronary artery disease.  Home Medications Prior to Admission medications   Medication Sig Start Date End Date Taking? Authorizing Provider  levofloxacin (LEVAQUIN) 500 MG tablet Take 1 tablet (500 mg total) by mouth daily. 09/07/23  Yes Derwood Kaplan, MD  acetaminophen (TYLENOL) 325 MG tablet Take 2 tablets (650 mg total) by mouth every 6 (six) hours as needed for mild pain (or Fever >/= 101). 08/10/22   Pearson Grippe, MD  Darunavir-Cobicistat-Emtricitabine-Tenofovir Alafenamide (SYMTUZA) 800-150-200-10 MG TABS Take 1 tablet by mouth daily. 07/07/23   Veryl Speak, FNP  feeding supplement (ENSURE ENLIVE / ENSURE PLUS) LIQD Take 237 mLs by mouth 3 (three) times daily between meals. Patient not taking: Reported on 07/07/2023 08/10/22   Pearson Grippe, MD  ondansetron (ZOFRAN) 4 MG tablet Take 1 tablet (4 mg total) by mouth every 6 (six) hours as needed for nausea. 08/10/22   Pearson Grippe, MD  rosuvastatin (CRESTOR) 10 MG tablet Take 1 tablet (10 mg total) by mouth daily. 07/07/23   Veryl Speak, FNP  tamsulosin (FLOMAX) 0.4 MG CAPS capsule Take 0.4 mg by mouth daily. 06/23/23   [provider]      Allergies    Penicillins    Review of Systems   Review of Systems  All other systems reviewed and are negative.   Physical Exam Updated Vital Signs BP 108/77   Pulse 89   Temp 97.9 F (36.6 C) (Oral)   Ht 5\' 8"  (1.727 m)   Wt 49.3 kg    SpO2 95%   BMI 16.51 kg/m  Physical Exam Vitals and nursing note reviewed.  Constitutional:      Appearance: He is well-developed.  HENT:     Head: Atraumatic.  Cardiovascular:     Rate and Rhythm: Normal rate.  Pulmonary:     Effort: Pulmonary effort is normal.     Breath sounds: Normal breath sounds. No decreased breath sounds or wheezing.  Musculoskeletal:     Cervical back: Neck supple.  Skin:    General: Skin is warm.  Neurological:     Mental Status: He is alert and oriented to person, place, and time.     ED Results / Procedures / Treatments   Labs (all labs ordered are listed, but only abnormal results are displayed) Labs Reviewed  BASIC METABOLIC PANEL - Abnormal; Notable for the following components:      Result Value   Glucose, Bld 111 (*)    BUN 24 (*)    Calcium 12.4 (*)    All other components within normal limits  CBC - Abnormal; Notable for the following components:   RBC 4.07 (*)    Hemoglobin 12.3 (*)    HCT 37.7 (*)    All other components within normal limits  TROPONIN I (HIGH SENSITIVITY)  TROPONIN I (HIGH SENSITIVITY)    EKG EKG Interpretation Date/Time:  Saturday September 06 2023 09:03:59 EDT Ventricular Rate:  96 PR Interval:  186 QRS Duration:  103 QT Interval:  353 QTC Calculation: 447 R Axis:   -59  Text Interpretation: Sinus rhythm Left anterior fascicular block RSR' in V1 or V2, right VCD or RVH ST elevation, consider inferior injury No acute changes No significant change since last tracing Confirmed by Derwood Kaplan [phone]) on 09/06/2023 10:01:46 AM  Radiology DG Chest 2 View  Result Date: 09/06/2023 CLINICAL DATA:  72 year old male with history of central chest pain for the past 3 days. EXAM: CHEST - 2 VIEW COMPARISON:  Chest x-ray 08/06/2022. FINDINGS: Emphysematous changes are again noted throughout the lungs. Patchy ill-defined opacity noted throughout the right lower lobe. Atelectasis and/or scarring in the medial aspect of  the left lower lobe also noted, similar to the prior study. Trace bilateral pleural effusions. No pneumothorax. No evidence of pulmonary edema. Heart size is normal. Mediastinal contours are within normal limits. IMPRESSION: 1. Interval development of ill-defined airspace disease in the right lower lobe concerning for developing pneumonia. 2. Trace bilateral pleural effusions. 3. Emphysema. 4. Aortic atherosclerosis. Electronically Signed   By: Trudie Reed M.D.   On: 09/06/2023 09:58    Procedures Procedures    Medications Ordered in ED Medications  levofloxacin (LEVAQUIN) tablet 500 mg (500 mg Oral Given 09/06/23 1256)    ED Course/ Medical Decision Making/ A&P                                 Medical Decision Making Amount and/or Complexity of Data Reviewed Labs: ordered. Radiology: ordered.  Risk Prescription drug management.  72 year old male comes in with chief complaint of chest pain.  Chest pain has been present for 3 days.  Associated symptoms include cough.  He has pertinent past medical history of HIV, COPD. The complaint involves an extensive differential diagnosis and also carries with it a high risk of complications and morbidity.    The differential diagnosis considered for this patient includes  ACS syndrome Aortic dissection CHF exacerbation Valvular disorder Myocarditis Pericarditis Endocarditis Pericardial effusion / tamponade Pneumonia Pleural effusion / Pulmonary edema PE Pneumothorax Musculoskeletal pain PUD / Gastritis / Esophagitis Esophageal spasm  The initial plan is to get basic labs, delta troponin, x-ray of the chest.   Additional history obtained: Additional history obtained from nursing home/care facility Records reviewed previous admission documents  Independent labs interpretation:  The following labs were independently interpreted: CBC, BMP and troponins are reassuring.  Independent visualization and interpretation of imaging: -  I independently visualized the following imaging with scope of interpretation limited to determining acute life threatening conditions related to emergency care: X-ray of the chest, which revealed some evidence of consolidation on the right side.  Per radiologist, patient could be having early pneumonia.  Treatment and Reassessment: Patient confirms that he has been having cough that is producing clear phlegm.  He has COPD, HIV.  Best to start him on Augmentin, and treat this like CAP. No significant wheezing.  I do not think he is having COPD exacerbation at this time.   Final Clinical Impression(s) / ED Diagnoses Final diagnoses:  Community acquired pneumonia of left lung, unspecified part of lung    Rx / DC Orders ED Discharge Orders          Ordered    levofloxacin (LEVAQUIN) 500 MG tablet  Daily        09/06/23  1610              Derwood Kaplan, MD 09/06/23 1309

## 2023-12-30 ENCOUNTER — Encounter (HOSPITAL_COMMUNITY): Payer: Self-pay

## 2023-12-30 ENCOUNTER — Emergency Department (HOSPITAL_COMMUNITY): Payer: 59

## 2023-12-30 ENCOUNTER — Inpatient Hospital Stay (HOSPITAL_COMMUNITY)
Admission: EM | Admit: 2023-12-30 | Discharge: 2024-01-02 | DRG: 388 | Disposition: A | Discharge status: Skilled Nursing Facility | Payer: 59 | Source: Skilled Nursing Facility | Attending: Internal Medicine | Admitting: Internal Medicine

## 2023-12-30 ENCOUNTER — Other Ambulatory Visit: Payer: Self-pay

## 2023-12-30 DIAGNOSIS — K56600 Partial intestinal obstruction, unspecified as to cause: Secondary | ICD-10-CM | POA: Diagnosis present

## 2023-12-30 DIAGNOSIS — E860 Dehydration: Secondary | ICD-10-CM | POA: Diagnosis present

## 2023-12-30 DIAGNOSIS — D351 Benign neoplasm of parathyroid gland: Secondary | ICD-10-CM | POA: Diagnosis present

## 2023-12-30 DIAGNOSIS — R6511 Systemic inflammatory response syndrome (SIRS) of non-infectious origin with acute organ dysfunction: Secondary | ICD-10-CM | POA: Diagnosis present

## 2023-12-30 DIAGNOSIS — J4489 Other specified chronic obstructive pulmonary disease: Secondary | ICD-10-CM | POA: Diagnosis present

## 2023-12-30 DIAGNOSIS — R627 Adult failure to thrive: Secondary | ICD-10-CM | POA: Diagnosis present

## 2023-12-30 DIAGNOSIS — E872 Acidosis, unspecified: Secondary | ICD-10-CM | POA: Diagnosis present

## 2023-12-30 DIAGNOSIS — A419 Sepsis, unspecified organism: Principal | ICD-10-CM

## 2023-12-30 DIAGNOSIS — Z79899 Other long term (current) drug therapy: Secondary | ICD-10-CM | POA: Diagnosis not present

## 2023-12-30 DIAGNOSIS — J439 Emphysema, unspecified: Secondary | ICD-10-CM | POA: Diagnosis present

## 2023-12-30 DIAGNOSIS — N210 Calculus in bladder: Secondary | ICD-10-CM | POA: Diagnosis present

## 2023-12-30 DIAGNOSIS — H409 Unspecified glaucoma: Secondary | ICD-10-CM | POA: Diagnosis present

## 2023-12-30 DIAGNOSIS — R636 Underweight: Secondary | ICD-10-CM | POA: Diagnosis present

## 2023-12-30 DIAGNOSIS — B20 Human immunodeficiency virus [HIV] disease: Secondary | ICD-10-CM | POA: Diagnosis present

## 2023-12-30 DIAGNOSIS — G9341 Metabolic encephalopathy: Secondary | ICD-10-CM | POA: Diagnosis present

## 2023-12-30 DIAGNOSIS — N400 Enlarged prostate without lower urinary tract symptoms: Secondary | ICD-10-CM | POA: Diagnosis present

## 2023-12-30 DIAGNOSIS — K5641 Fecal impaction: Secondary | ICD-10-CM | POA: Diagnosis present

## 2023-12-30 DIAGNOSIS — H547 Unspecified visual loss: Secondary | ICD-10-CM | POA: Diagnosis present

## 2023-12-30 DIAGNOSIS — F17210 Nicotine dependence, cigarettes, uncomplicated: Secondary | ICD-10-CM | POA: Diagnosis present

## 2023-12-30 DIAGNOSIS — K56609 Unspecified intestinal obstruction, unspecified as to partial versus complete obstruction: Secondary | ICD-10-CM

## 2023-12-30 DIAGNOSIS — Z880 Allergy status to penicillin: Secondary | ICD-10-CM | POA: Diagnosis not present

## 2023-12-30 LAB — CBC
HCT: 47.1 % (ref 39.0–52.0)
Hemoglobin: 14.6 g/dL (ref 13.0–17.0)
MCH: 30.4 pg (ref 26.0–34.0)
MCHC: 31 g/dL (ref 30.0–36.0)
MCV: 98.1 fL (ref 80.0–100.0)
Platelets: 137 10*3/uL — ABNORMAL LOW (ref 150–400)
RBC: 4.8 MIL/uL (ref 4.22–5.81)
RDW: 14.4 % (ref 11.5–15.5)
WBC: 13 10*3/uL — ABNORMAL HIGH (ref 4.0–10.5)
nRBC: 0 % (ref 0.0–0.2)

## 2023-12-30 LAB — COMPREHENSIVE METABOLIC PANEL
ALT: 35 U/L (ref 0–44)
AST: 32 U/L (ref 15–41)
Albumin: 4.1 g/dL (ref 3.5–5.0)
Alkaline Phosphatase: 64 U/L (ref 38–126)
Anion gap: 12 (ref 5–15)
BUN: 29 mg/dL — ABNORMAL HIGH (ref 8–23)
CO2: 21 mmol/L — ABNORMAL LOW (ref 22–32)
Calcium: 13.7 mg/dL (ref 8.9–10.3)
Chloride: 106 mmol/L (ref 98–111)
Creatinine, Ser: 1.65 mg/dL — ABNORMAL HIGH (ref 0.61–1.24)
GFR, Estimated: 44 mL/min — ABNORMAL LOW (ref >60)
Glucose, Bld: 138 mg/dL — ABNORMAL HIGH (ref 70–99)
Potassium: 3.6 mmol/L (ref 3.5–5.1)
Sodium: 139 mmol/L (ref 135–145)
Total Bilirubin: 0.5 mg/dL (ref 0.0–1.2)
Total Protein: 9.4 g/dL — ABNORMAL HIGH (ref 6.5–8.1)

## 2023-12-30 LAB — LACTIC ACID, PLASMA: Lactic Acid, Venous: 4.4 mmol/L (ref 0.5–1.9)

## 2023-12-30 LAB — LIPASE, BLOOD: Lipase: 18 U/L (ref 11–51)

## 2023-12-30 LAB — MRSA NEXT GEN BY PCR, NASAL: MRSA by PCR Next Gen: NOT DETECTED

## 2023-12-30 MED ORDER — BISACODYL 10 MG RE SUPP: 10.0000 mg | Freq: Every day | RECTAL | Status: DC | PRN

## 2023-12-30 MED ORDER — IOHEXOL 300 MG/ML  SOLN
75.0000 mL | Freq: Once | INTRAMUSCULAR | Status: AC | PRN
  Administered 2023-12-30: 75 mL via INTRAVENOUS

## 2023-12-30 MED ORDER — HYDROMORPHONE HCL 1 MG/ML IJ SOLN: 0.5000 mg | INTRAMUSCULAR | Status: DC | PRN

## 2023-12-30 MED ORDER — HEPARIN SODIUM (PORCINE) 5000 UNIT/ML IJ SOLN
5000.0000 [IU] | Freq: Three times a day (TID) | INTRAMUSCULAR | Status: DC
  Administered 2023-12-30 – 2024-01-02 (×9): 5000 [IU] via SUBCUTANEOUS
  Filled 2023-12-30 (×9): qty 1

## 2023-12-30 MED ORDER — DIATRIZOATE MEGLUMINE & SODIUM 66-10 % PO SOLN
90.0000 mL | Freq: Once | ORAL | Status: DC
  Filled 2023-12-30: qty 90

## 2023-12-30 MED ORDER — SODIUM CHLORIDE 0.9 % IV BOLUS
1000.0000 mL | Freq: Once | INTRAVENOUS | Status: AC
  Administered 2023-12-30: 1000 mL via INTRAVENOUS

## 2023-12-30 MED ORDER — PIPERACILLIN-TAZOBACTAM 3.375 G IVPB
3.3750 g | Freq: Three times a day (TID) | INTRAVENOUS | Status: DC
  Administered 2023-12-30 – 2024-01-02 (×9): 3.375 g via INTRAVENOUS
  Filled 2023-12-30 (×9): qty 50

## 2023-12-30 MED ORDER — ACETAMINOPHEN 325 MG PO TABS: 650.0000 mg | ORAL_TABLET | Freq: Four times a day (QID) | ORAL | Status: DC | PRN

## 2023-12-30 MED ORDER — ACETAMINOPHEN 650 MG RE SUPP: 650.0000 mg | Freq: Four times a day (QID) | RECTAL | Status: DC | PRN

## 2023-12-30 MED ORDER — ONDANSETRON HCL 4 MG PO TABS: 4.0000 mg | ORAL_TABLET | Freq: Four times a day (QID) | ORAL | Status: DC | PRN

## 2023-12-30 MED ORDER — FLEET ENEMA RE ENEM: 1.0000 | ENEMA | Freq: Once | RECTAL | Status: DC

## 2023-12-30 MED ORDER — VANCOMYCIN HCL IN DEXTROSE 1-5 GM/200ML-% IV SOLN
1000.0000 mg | Freq: Once | INTRAVENOUS | Status: AC
  Administered 2023-12-30: 1000 mg via INTRAVENOUS
  Filled 2023-12-30: qty 200

## 2023-12-30 MED ORDER — TAMSULOSIN HCL 0.4 MG PO CAPS: 0.4000 mg | ORAL_CAPSULE | Freq: Every day | ORAL | Status: DC

## 2023-12-30 MED ORDER — VANCOMYCIN HCL 750 MG/150ML IV SOLN: 750.0000 mg | INTRAVENOUS | Status: DC

## 2023-12-30 MED ORDER — ORAL CARE MOUTH RINSE: 15.0000 mL | OROMUCOSAL | Status: DC | PRN

## 2023-12-30 MED ORDER — DEXTROSE-SODIUM CHLORIDE 5-0.45 % IV SOLN
INTRAVENOUS | Status: AC
Start: 2023-12-30 — End: 2023-12-31

## 2023-12-30 MED ORDER — DARUN-COBIC-EMTRICIT-TENOFAF 800-150-200-10 MG PO TABS
1.0000 | ORAL_TABLET | Freq: Every day | ORAL | Status: DC
  Filled 2023-12-30 (×3): qty 1

## 2023-12-30 MED ORDER — ROSUVASTATIN CALCIUM 10 MG PO TABS: 10.0000 mg | ORAL_TABLET | Freq: Every day | ORAL | Status: DC

## 2023-12-30 NOTE — ED Provider Notes (Signed)
 [place] EMERGENCY DEPARTMENT AT [facility] Provider Note   CSN: [id number] Arrival date & time: 12/30/23  1155     History  Chief Complaint  Patient presents with  . Nausea  . Emesis    SAAHAS HIDROGO is a 72 y.o. male.  This is a 72 year old male presenting emergency department with abdominal pain distention with nausea vomiting.  Reports worsening abdominal pain since Saturday with nausea.  Vomiting started this morning.  Facility transferred him for concerns for possible aspiration.  No reported hypoxia.  Patient is not short of breath.  Reports loose bowel movement this morning.   Emesis      Home Medications Prior to Admission medications   Medication Sig Start Date End Date Taking? Authorizing Provider  acetaminophen  (TYLENOL ) 325 MG tablet Take 2 tablets (650 mg total) by mouth every 6 (six) hours as needed for mild pain (or Fever >/= 101). 08/10/22   Luke Agent, MD  Darunavir -Cobicistat-Emtricitabine -Tenofovir  Alafenamide (SYMTUZA ) 800-150-200-10 MG TABS Take 1 tablet by mouth daily. 07/07/23   Calone, Gregory D, FNP  feeding supplement (ENSURE ENLIVE / ENSURE PLUS) LIQD Take 237 mLs by mouth 3 (three) times daily between meals. Patient not taking: Reported on 07/07/2023 08/10/22   Luke Agent, MD  levofloxacin  (LEVAQUIN ) 500 MG tablet Take 1 tablet (500 mg total) by mouth daily. 09/07/23   Charlyn Sora, MD  ondansetron  (ZOFRAN ) 4 MG tablet Take 1 tablet (4 mg total) by mouth every 6 (six) hours as needed for nausea. 08/10/22   Luke Agent, MD  rosuvastatin  (CRESTOR ) 10 MG tablet Take 1 tablet (10 mg total) by mouth daily. 07/07/23   Calone, Gregory D, FNP  tamsulosin  (FLOMAX ) 0.4 MG CAPS capsule Take 0.4 mg by mouth daily. 06/23/23   [provider]      Allergies    Penicillins    Review of Systems   Review of Systems  Gastrointestinal:  Positive for vomiting.    Physical Exam Updated Vital Signs BP 110/88 (BP Location: Right Arm)   Pulse  (!) 115   Temp (!) 97.4 F (36.3 C) (Axillary)   Resp (!) 21   SpO2 95%  Physical Exam Vitals and nursing note reviewed.  Constitutional:      General: He is not in acute distress. HENT:     Head: Normocephalic.     Nose: Nose normal.     Mouth/Throat:     Mouth: Mucous membranes are moist.  Eyes:     Conjunctiva/sclera: Conjunctivae normal.  Cardiovascular:     Rate and Rhythm: Regular rhythm. Tachycardia present.  Pulmonary:     Effort: Pulmonary effort is normal.  Abdominal:     General: There is distension.     Tenderness: There is abdominal tenderness. There is guarding. There is no rebound.  Musculoskeletal:        General: Normal range of motion.  Skin:    General: Skin is warm and dry.     Capillary Refill: Capillary refill takes less than 2 seconds.  Neurological:     Mental Status: He is alert and oriented to person, place, and time.  Psychiatric:        Mood and Affect: Mood normal.        Behavior: Behavior normal.     ED Results / Procedures / Treatments   Labs (all labs ordered are listed, but only abnormal results are displayed) Labs Reviewed  CBC - Abnormal; Notable for the following components:  Result Value   WBC 13.0 (*)    Platelets 137 (*)    All other components within normal limits  COMPREHENSIVE METABOLIC PANEL - Abnormal; Notable for the following components:   CO2 21 (*)    Glucose, Bld 138 (*)    BUN 29 (*)    Creatinine, Ser 1.65 (*)    Calcium  13.7 (*)    Total Protein 9.4 (*)    GFR, Estimated 44 (*)    All other components within normal limits  LACTIC ACID, PLASMA - Abnormal; Notable for the following components:   Lactic Acid, Venous 4.4 (*)    All other components within normal limits  CULTURE, BLOOD (ROUTINE X 2)  CULTURE, BLOOD (ROUTINE X 2)  LIPASE, BLOOD  URINALYSIS, W/ REFLEX TO CULTURE (INFECTION SUSPECTED)    EKG None  Radiology CT ABDOMEN PELVIS W CONTRAST Result Date: 12/30/2023 CLINICAL DATA:  Nausea  and vomiting, possible aspiration, concern for bowel obstruction EXAM: CT ABDOMEN AND PELVIS WITH CONTRAST TECHNIQUE: Multidetector CT imaging of the abdomen and pelvis was performed using the standard protocol following bolus administration of intravenous contrast. RADIATION DOSE REDUCTION: This exam was performed according to the departmental dose-optimization program which includes automated exposure control, adjustment of the mA and/or kV according to patient size and/or use of iterative reconstruction technique. CONTRAST:  75mL OMNIPAQUE  IOHEXOL  300 MG/ML  SOLN COMPARISON:  08/03/2022 FINDINGS: Lower chest: Persistent area of subpleural rounded consolidation within the left lower lobe posterior costophrenic angle, consistent with rounded atelectasis or scarring. In addition, there is new ground-glass airspace disease within the more superior dependent left lower lobe, which could reflect aspiration given clinical presentation. Hepatobiliary: Small calcified gallstones without cholecystitis. The liver is unremarkable. No biliary duct dilation. Pancreas: Unremarkable. No pancreatic ductal dilatation or surrounding inflammatory changes. Spleen: Normal in size without focal abnormality. Adrenals/Urinary Tract: Simple appearing bilateral renal cortical cysts are again noted, which do not require specific imaging follow-up. There are too numerous to count bilateral nonobstructing renal calculi, which are new since prior CT. Largest calculus on the right measures up to 4 mm. Largest calculus on the left within the renal pelvis measures up to 12 mm. No hydronephrosis or hydroureter. A large bladder calculus is again identified, measuring up to 3.8 cm in size. Bladder is otherwise unremarkable. The adrenals are normal. Stomach/Bowel: There is a large amount of retained stool within the rectal vault and rectosigmoid colon, consistent with fecal impaction. Associated rectal wall thickening compatible with an element of  stercoral colitis. There is also evidence of small bowel obstruction, with numerous distended loops of small bowel throughout the abdomen and pelvis, measuring up to 3.8 cm in size. There are several loops of decompressed small bowel within the right upper abdomen image 30/2 and right mid abdomen image 39/2, which could be the transition points. Marked gastric distension is also noted, and decompression with enteric catheter should be considered in this patient with clinical and imaging features concerning for aspiration. Vascular/Lymphatic: Aortic atherosclerosis. No enlarged abdominal or pelvic lymph nodes. Reproductive: Prostate is unremarkable. Other: No free fluid or free intraperitoneal gas. No abdominal wall hernia. Musculoskeletal: No acute or destructive bony abnormalities. Reconstructed images demonstrate no additional findings. IMPRESSION: 1. Small-bowel obstruction, with transition point likely within the right upper or right mid abdomen where several loops of decompressed small bowel are noted. 2. Marked gastric distension. Decompression with enteric catheter should be considered in this patient with clinical and imaging findings concerning for aspiration. 3. Chronic left  lower lobe scarring or atelectasis, with new ground-glass airspace disease concerning for aspiration. 4. Large amount of retained stool within the rectum and distal rectosigmoid colon, consistent with fecal impaction. Rectal wall thickening compatible with stercoral colitis. 5. Numerous bilateral nonobstructing renal calculi, new since prior study. Large bladder calculus, increased in size since prior exam. 6. Cholelithiasis without cholecystitis. 7.  Aortic Atherosclerosis (ICD10-I70.0). Electronically Signed   By: Ozell Daring M.D.   On: 12/30/2023 15:21   DG Chest Portable 1 View Result Date: 12/30/2023 CLINICAL DATA:  Nausea and vomiting, aspiration EXAM: PORTABLE CHEST 1 VIEW COMPARISON:  09/06/2023 FINDINGS: Single frontal  view of the chest demonstrates chronic elevation of the left hemidiaphragm. The cardiac silhouette is unremarkable. Chronic scarring at the left base. No acute airspace disease, effusion, or pneumothorax. Bullous emphysematous changes are again seen at the apices. No acute bony abnormality. IMPRESSION: 1. No acute intrathoracic process. 2. Chronic elevation of the left hemidiaphragm with stable left lower lobe scarring or atelectasis. 3. Stable emphysema. Electronically Signed   By: Ozell Daring M.D.   On: 12/30/2023 15:04    Procedures Procedures    Medications Ordered in ED Medications  sodium chloride  0.9 % bolus 1,000 mL (has no administration in time range)  vancomycin  (VANCOCIN ) IVPB 1000 mg/200 mL premix (has no administration in time range)  vancomycin  (VANCOREADY) IVPB 750 mg/150 mL (has no administration in time range)  piperacillin -tazobactam (ZOSYN ) IVPB 3.375 g (has no administration in time range)  sodium phosphate (FLEET) enema 1 enema (has no administration in time range)  sodium chloride  0.9 % bolus 1,000 mL (1,000 mLs Intravenous New Bag/Given 12/30/23 1349)  iohexol  (OMNIPAQUE ) 300 MG/ML solution 75 mL (75 mLs Intravenous Contrast Given 12/30/23 1440)    ED Course/ Medical Decision Making/ A&P Clinical Course as of 12/30/23 1545  Tue Dec 30, 2023  1527 CT ABDOMEN PELVIS W CONTRAST IMPRESSION: 1. Small-bowel obstruction, with transition point likely within the right upper or right mid abdomen where several loops of decompressed small bowel are noted. 2. Marked gastric distension. Decompression with enteric catheter should be considered in this patient with clinical and imaging findings concerning for aspiration. 3. Chronic left lower lobe scarring or atelectasis, with new ground-glass airspace disease concerning for aspiration. 4. Large amount of retained stool within the rectum and distal rectosigmoid colon, consistent with fecal impaction. Rectal wall thickening  compatible with stercoral colitis. 5. Numerous bilateral nonobstructing renal calculi, new since prior study. Large bladder calculus, increased in size since prior exam. 6. Cholelithiasis without cholecystitis.   [TY]  [id number] Spoke with Dr. Mavis with surgery who will review CT scan and call back [TY]  1544 Dr. Mavis reviewed images.  No acute surgery planned at this time.  Recommending medicine admission with surgery consult.  Place NG tube for decompression as well as enemas/disimpaction.  Agrees with continued antibiotics. [TY]    Clinical Course User Index [TY] Neysa Caron PARAS, DO                                 Medical Decision Making This is a 72 year old male presenting emergency department with abdominal distention, nausea and vomiting.  He is tachycardic, tachypneic maintaining oxygen saturation on room air.  Has distended abdomen diffuse tenderness with some voluntary guarding.  Concerning vital signs found infectious workup showed elevated WBC and lactate.  Covered empirically with Vanco Zosyn  as he is immunocompromise with HIV.  No  significant metabolic derangements.  Appears to have elevated creatinine and calcium .  Lipase normal.  Pancreatitis unlikely.  Chest x-ray without evidence of aspiration.  CT abdomen with SBO, as well as fecal impaction. Inserting NG tube for decompression. Will also order enema. Surgery consulted. Will need admission for further management.   Amount and/or Complexity of Data Reviewed Labs: ordered. Radiology: ordered. Decision-making details documented in ED Course. ECG/medicine tests: ordered.  Risk OTC drugs. Prescription drug management. Decision regarding hospitalization.         Final Clinical Impression(s) / ED Diagnoses Final diagnoses:  Sepsis, due to unspecified organism, unspecified whether acute organ dysfunction present [facility])    Rx / DC Orders ED Discharge Orders     None         Neysa Caron PARAS, DO 12/30/23  1533

## 2023-12-30 NOTE — Assessment & Plan Note (Signed)
 No report of respiratory symptoms except cough. No report of fever, sputum production. No inhalational medications on home med list. CXR with emphysema but no active disease.  Plan SABA prn

## 2023-12-30 NOTE — ED Notes (Signed)
Attempted to place NG tube. Patient unable to tolerate procedure.

## 2023-12-30 NOTE — Assessment & Plan Note (Signed)
 Patient followed at [facility] with last visit 07/07/23. He was stable and controlled. He was to return in 6 months.  Plan Continue home meds

## 2023-12-30 NOTE — Assessment & Plan Note (Signed)
 Patient with abdominal pain, nausea and vomiting. CT reveals SBO with transition at RUQ. Also gastric distention. IN ED NG placed. Dr. Lovell Sheehan for GS consulted  Plan Continue NG at low suction  SBO protocol  GS to follow.

## 2023-12-30 NOTE — Subjective & Objective (Signed)
 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 Today he had episode(s) of emesis and cough. The staff at the long-term care facility sent him to [facility] for possible aspiration and for evaluation of abdominal pain.

## 2023-12-30 NOTE — Assessment & Plan Note (Signed)
Patient has minimal residual vision. No glaucoma drops on med list but he states drops are used once a week(?).  Plan Follow up with SNF about current eye drops

## 2023-12-30 NOTE — ED Triage Notes (Signed)
Patient was brought from [facility] nursing care center by [facility] for nausea and vomiting starting this morning with concerns of aspiration.

## 2023-12-30 NOTE — ED Notes (Signed)
Attempted to place NG tube and patient choked on the water, so stopped to give him a rest, let his Sp02 improve and heart rate lower.

## 2023-12-30 NOTE — Progress Notes (Signed)
 Pharmacy Antibiotic Note  [name] is a 72 y.o. male admitted on 12/30/2023 with  possible aspiration .  Pharmacy has been consulted for vancomycin  and zosyn  dosing.  Patient currently afebrile with wbc of 13, scr of 1.6 and LA 4.4.   Vancomycin  1g IV x 1 then 750mg  Q 24 hrs. Goal AUC 400-550. Expected AUC: 520 SCr used: 1.65 Zosyn  3.375g IV q8 hours    Temp (24hrs), Avg:97.4 F (36.3 C), Min:97.4 F (36.3 C), Max:97.4 F (36.3 C)  Recent Labs  Lab 12/30/23 1327  WBC 13.0*  CREATININE 1.65*  LATICACIDVEN 4.4*    CrCl cannot be calculated (Unknown ideal weight.).    Allergies  Allergen Reactions   Penicillins Hives    Has patient had a PCN reaction causing immediate rash, facial/tongue/throat swelling, SOB or lightheadedness with hypotension: No Has patient had a PCN reaction causing severe rash involving mucus membranes or skin necrosis: No Has patient had a PCN reaction that required hospitalization: No Has patient had a PCN reaction occurring within the last 10 years: No If all of the above answers are NO, then may proceed with Cephalosporin use.   Thank you for allowing pharmacy to be a part of this patient's care.  Dempsey Blush PharmD., BCPS Clinical Pharmacist 12/30/2023 2:25 PM

## 2023-12-30 NOTE — H&P (Signed)
 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 Today he had episode(s) of emesis and cough. The staff at the long-term care facility sent him to [facility] for possible aspiration and for evaluation of abdominal pain.    ED Course: T 97.4  110/88  HR 115  RR 21. Underweight, chronically ill appearing man who is actively vomiting. NG not placed at time of exam. Lab: Glucose 138, Cr. !.65 (1.22 baseline), Calcium  13.7 (chronic). Lactic acid 4.0. WBC 4.8, Hgb 14.6, CXR-NAD, emphysema. CT abd/pelvis - SBO with transition point RUQ, gastric distention, fecal impaction.Numerous non-obstructing renal calculi, large calculus in bladder, non-obstructing gallstones. EDP ordered NG to be placed (pending) and did consult GS - Dr. Mavis. [facility] called to admit for medical management.  Review of Systems:  Review of Systems  Constitutional:  Negative for fever and weight loss.  HENT: Negative.    Eyes:        Essentially blind with minimal ability to detect light and movement.  Respiratory:  Positive for cough. Negative for sputum production, shortness of breath and wheezing.   Cardiovascular:  Negative for chest pain and palpitations.  Gastrointestinal:  Positive for abdominal pain, constipation, nausea and vomiting.  Genitourinary: Negative.   Musculoskeletal: Negative.   Skin: Negative.   Neurological:  Positive for weakness.  Psychiatric/Behavioral: Negative.      Past Medical History:  Diagnosis Date   Adult failure to thrive    Blind    BPH (benign prostatic hyperplasia)    COPD (chronic obstructive pulmonary disease) (HCC)     Dysphagia    HIV (human immunodeficiency virus infection) (HCC)    Metabolic encephalopathy    Seizures (HCC)    Tobacco use    UTI (urinary tract infection)    Vitamin D  deficiency     Past Surgical History:  Procedure Laterality Date   HAND SURGERY     Soc Hx -  poor historian. Resides in long term care facility    reports that he has been smoking cigarettes. He has never used smokeless tobacco. He reports that he does not drink alcohol and does not use drugs.  Allergies  Allergen Reactions   Penicillins Hives    Has patient had a PCN reaction causing immediate rash, facial/tongue/throat swelling, SOB or lightheadedness with hypotension: No Has patient had a PCN reaction causing severe rash involving mucus membranes or skin necrosis: No Has patient had a PCN reaction that required hospitalization: No Has patient had a PCN reaction occurring within the last 10 years: No If all of the above answers are NO, then may proceed with Cephalosporin use.     Family History  Problem Relation Age of Onset   High blood pressure Mother     Prior to Admission medications   Medication Sig Start Date End Date Taking? Authorizing Provider  acetaminophen  (TYLENOL ) 325 MG tablet Take 2 tablets (650 mg total) by mouth every 6 (six) hours as needed for mild pain (or Fever >/= 101). 08/10/22   Luke Agent, MD  Darunavir -Cobicistat-Emtricitabine -Tenofovir  Alafenamide (SYMTUZA ) 800-150-200-10 MG TABS Take 1 tablet by mouth daily. 07/07/23   Calone, Gregory D, FNP  feeding supplement (ENSURE ENLIVE / ENSURE PLUS) LIQD Take 237 mLs by mouth 3 (three) times daily between meals. Patient not taking: Reported on 07/07/2023 08/10/22   Luke Agent, MD  levofloxacin  (LEVAQUIN ) 500 MG tablet Take 1 tablet (500 mg total) by mouth daily. 09/07/23   Charlyn Sora, MD  ondansetron  (ZOFRAN ) 4 MG tablet Take 1 tablet (4 mg total) by mouth every 6 (six) hours as needed for nausea. 08/10/22   Luke Agent, MD   rosuvastatin  (CRESTOR ) 10 MG tablet Take 1 tablet (10 mg total) by mouth daily. 07/07/23   Calone, Gregory D, FNP  tamsulosin  (FLOMAX ) 0.4 MG CAPS capsule Take 0.4 mg by mouth daily. 06/23/23   [provider]    Physical Exam: Vitals:   12/30/23 1225 12/30/23 1228  BP: 110/88   Pulse: (!) 115   Resp: (!) 21   Temp:  (!) 97.4 F (36.3 C)  TempSrc:  Axillary  SpO2: 95%     Physical Exam Vitals and nursing note reviewed.  Constitutional:      General: He is in acute distress.     Appearance: He is ill-appearing. He is not diaphoretic.     Comments: Emaciated. Awake and alert, conversant.  HENT:     Head: Normocephalic and atraumatic.     Comments: Temporal wasting.    Mouth/Throat:     Mouth: Mucous membranes are moist.     Comments: Missing many teeth, poor dentition otherwise Eyes:     Extraocular Movements: Extraocular movements intact.  Cardiovascular:     Rate and Rhythm: Regular rhythm. Tachycardia present.     Pulses: Normal pulses.     Heart sounds: Normal heart sounds.     Comments: Distant heart sounds Pulmonary:     Effort: Pulmonary effort is normal. No respiratory distress.     Breath sounds: No wheezing or rales.     Comments: Decreased breath sounds Abdominal:     General: There is no distension.     Palpations: There is no mass.     Tenderness: There is abdominal tenderness. There is guarding. There is no rebound.     Comments: Absent BS  Musculoskeletal:        General: Normal range of motion.     Cervical back: Normal range of motion. No rigidity or tenderness.     Right lower leg: No edema.     Left lower leg: No edema.  Lymphadenopathy:     Cervical: No cervical adenopathy.  Skin:    General: Skin is warm and dry.  Neurological:     Mental Status: He is oriented to person, place, and time.     Cranial Nerves: No cranial nerve deficit.  Psychiatric:        Mood and Affect: Mood normal.      Labs on Admission: I have personally  reviewed following labs and imaging studies  CBC: Recent Labs  Lab 12/30/23 1327  WBC 13.0*  HGB 14.6  HCT 47.1  MCV 98.1  PLT 137*   Basic Metabolic Panel: Recent Labs  Lab 12/30/23 1327  NA 139  K 3.6  CL 106  CO2 21*  GLUCOSE 138*  BUN 29*  CREATININE 1.65*  CALCIUM  13.7*   GFR: CrCl cannot be calculated (Unknown ideal weight.). Liver Function Tests: Recent Labs  Lab 12/30/23 1327  AST 32  ALT 35  ALKPHOS 64  BILITOT 0.5  PROT 9.4*  ALBUMIN 4.1   Recent Labs  Lab 12/30/23 1327  LIPASE  18   No results for input(s): AMMONIA in the last 168 hours. Coagulation Profile: No results for input(s): INR, PROTIME in the last 168 hours. Cardiac Enzymes: No results for input(s): CKTOTAL, CKMB, CKMBINDEX, TROPONINI in the last 168 hours. BNP (last 3 results) No results for input(s): PROBNP in the last 8760 hours. HbA1C: No results for input(s): HGBA1C in the last 72 hours. CBG: No results for input(s): GLUCAP in the last 168 hours. Lipid Profile: No results for input(s): CHOL, HDL, LDLCALC, TRIG, CHOLHDL, LDLDIRECT in the last 72 hours. Thyroid  Function Tests: No results for input(s): TSH, T4TOTAL, FREET4, T3FREE, THYROIDAB in the last 72 hours. Anemia Panel: No results for input(s): VITAMINB12, FOLATE, FERRITIN, TIBC, IRON, RETICCTPCT in the last 72 hours. Urine analysis:    Component Value Date/Time   COLORURINE AMBER (A) 08/03/2022 2129   APPEARANCEUR TURBID (A) 08/03/2022 2129   LABSPEC 1.013 08/03/2022 2129   PHURINE 8.0 08/03/2022 2129   GLUCOSEU NEGATIVE 08/03/2022 2129   GLUCOSEU NEG mg/dL 95/83/7989 8067   HGBUR MODERATE (A) 08/03/2022 2129   BILIRUBINUR NEGATIVE 08/03/2022 2129   KETONESUR NEGATIVE 08/03/2022 2129   PROTEINUR 100 (A) 08/03/2022 2129   UROBILINOGEN 1 02/13/2012 1456   NITRITE POSITIVE (A) 08/03/2022 2129   LEUKOCYTESUR LARGE (A) 08/03/2022 2129    Radiological Exams on  Admission: I have personally reviewed images CT ABDOMEN PELVIS W CONTRAST Result Date: 12/30/2023 CLINICAL DATA:  Nausea and vomiting, possible aspiration, concern for bowel obstruction EXAM: CT ABDOMEN AND PELVIS WITH CONTRAST TECHNIQUE: Multidetector CT imaging of the abdomen and pelvis was performed using the standard protocol following bolus administration of intravenous contrast. RADIATION DOSE REDUCTION: This exam was performed according to the departmental dose-optimization program which includes automated exposure control, adjustment of the mA and/or kV according to patient size and/or use of iterative reconstruction technique. CONTRAST:  75mL OMNIPAQUE  IOHEXOL  300 MG/ML  SOLN COMPARISON:  08/03/2022 FINDINGS: Lower chest: Persistent area of subpleural rounded consolidation within the left lower lobe posterior costophrenic angle, consistent with rounded atelectasis or scarring. In addition, there is new ground-glass airspace disease within the more superior dependent left lower lobe, which could reflect aspiration given clinical presentation. Hepatobiliary: Small calcified gallstones without cholecystitis. The liver is unremarkable. No biliary duct dilation. Pancreas: Unremarkable. No pancreatic ductal dilatation or surrounding inflammatory changes. Spleen: Normal in size without focal abnormality. Adrenals/Urinary Tract: Simple appearing bilateral renal cortical cysts are again noted, which do not require specific imaging follow-up. There are too numerous to count bilateral nonobstructing renal calculi, which are new since prior CT. Largest calculus on the right measures up to 4 mm. Largest calculus on the left within the renal pelvis measures up to 12 mm. No hydronephrosis or hydroureter. A large bladder calculus is again identified, measuring up to 3.8 cm in size. Bladder is otherwise unremarkable. The adrenals are normal. Stomach/Bowel: There is a large amount of retained stool within the rectal vault  and rectosigmoid colon, consistent with fecal impaction. Associated rectal wall thickening compatible with an element of stercoral colitis. There is also evidence of small bowel obstruction, with numerous distended loops of small bowel throughout the abdomen and pelvis, measuring up to 3.8 cm in size. There are several loops of decompressed small bowel within the right upper abdomen image 30/2 and right mid abdomen image 39/2, which could be the transition points. Marked gastric distension is also noted, and decompression with enteric catheter should be considered in this patient with clinical and imaging features concerning for aspiration.  Vascular/Lymphatic: Aortic atherosclerosis. No enlarged abdominal or pelvic lymph nodes. Reproductive: Prostate is unremarkable. Other: No free fluid or free intraperitoneal gas. No abdominal wall hernia. Musculoskeletal: No acute or destructive bony abnormalities. Reconstructed images demonstrate no additional findings. IMPRESSION: 1. Small-bowel obstruction, with transition point likely within the right upper or right mid abdomen where several loops of decompressed small bowel are noted. 2. Marked gastric distension. Decompression with enteric catheter should be considered in this patient with clinical and imaging findings concerning for aspiration. 3. Chronic left lower lobe scarring or atelectasis, with new ground-glass airspace disease concerning for aspiration. 4. Large amount of retained stool within the rectum and distal rectosigmoid colon, consistent with fecal impaction. Rectal wall thickening compatible with stercoral colitis. 5. Numerous bilateral nonobstructing renal calculi, new since prior study. Large bladder calculus, increased in size since prior exam. 6. Cholelithiasis without cholecystitis. 7.  Aortic Atherosclerosis (ICD10-I70.0). Electronically Signed   By: Ozell Daring M.D.   On: 12/30/2023 15:21   DG Chest Portable 1 View Result Date:  12/30/2023 CLINICAL DATA:  Nausea and vomiting, aspiration EXAM: PORTABLE CHEST 1 VIEW COMPARISON:  09/06/2023 FINDINGS: Single frontal view of the chest demonstrates chronic elevation of the left hemidiaphragm. The cardiac silhouette is unremarkable. Chronic scarring at the left base. No acute airspace disease, effusion, or pneumothorax. Bullous emphysematous changes are again seen at the apices. No acute bony abnormality. IMPRESSION: 1. No acute intrathoracic process. 2. Chronic elevation of the left hemidiaphragm with stable left lower lobe scarring or atelectasis. 3. Stable emphysema. Electronically Signed   By: Ozell Daring M.D.   On: 12/30/2023 15:04    EKG: I have personally reviewed EKG: sinus tachycardia, minimal ST elevation inferior leads, no STEMI  Assessment/Plan Principal Problem:   SBO (small bowel obstruction) (HCC) Active Problems:   Other specified chronic obstructive pulmonary disease (HCC)   AIDS (acquired immune deficiency syndrome) (HCC)   Hypercalcemia   Glaucoma    Assessment and Plan: * SBO (small bowel obstruction) (HCC) Patient with abdominal pain, nausea and vomiting. CT reveals SBO with transition at RUQ. Also gastric distention. IN ED NG placed. Dr. Mavis for GS consulted  Plan Continue NG at low suction  SBO protocol  GS to follow.   Other specified chronic obstructive pulmonary disease (HCC) No report of respiratory symptoms except cough. No report of fever, sputum production. No inhalational medications on home med list. CXR with emphysema but no active disease.  Plan SABA prn  Glaucoma Patient has minimal residual vision. No glaucoma drops on med list but he states drops are used once a week(?).  Plan Follow up with SNF about current eye drops  Hypercalcemia Chronic hypercalcemia. He is known to have a parathyroid  adenoma. May have seen Dr. Eletha in the past but no record of surgery found  AIDS (acquired immune deficiency syndrome)  [facility]) Patient followed at [facility] with last visit 07/07/23. He was stable and controlled. He was to return in 6 months.  Plan Continue home meds       DVT prophylaxis: SQ Heparin  Code Status: Full Code per old records Family Communication: left message for brother Tanis Burnley and spoke to [employer], relative listed as person to contact on admissin  Disposition Plan: Return to LTC when stable  Consults called: GS- Dr. Mavis  Admission status: Inpatient, Med-Surg   Ozell Haggis, MD [facility] 12/30/2023, 6:29 PM

## 2023-12-30 NOTE — Progress Notes (Signed)
Pt arrived to unit and no NG tube in place. Received from ER nurse there were multiple attempts and pt unable to tolerate NG at this time. Notified Hospitalist.

## 2023-12-30 NOTE — ED Notes (Signed)
 ED TO INPATIENT HANDOFF REPORT  ED Nurse Name and Phone #: Vicenta Valery RAMAN Name/Age/Gender [name] 72 y.o. male Room/Bed: APA06/APA06  Code Status   Code Status: Full Code  Home/SNF/Other Nursing Home Patient oriented to: self, place, time, and situation Is this baseline? Yes   Triage Complete: Triage complete  Chief Complaint SBO (small bowel obstruction) [facility]) [K56.609]  Triage Note Patient was brought from [facility] care center by [facility] for nausea and vomiting starting this morning with concerns of aspiration.    Allergies Allergies  Allergen Reactions   Penicillins Hives    Has patient had a PCN reaction causing immediate rash, facial/tongue/throat swelling, SOB or lightheadedness with hypotension: No Has patient had a PCN reaction causing severe rash involving mucus membranes or skin necrosis: No Has patient had a PCN reaction that required hospitalization: No Has patient had a PCN reaction occurring within the last 10 years: No If all of the above answers are NO, then may proceed with Cephalosporin use.     Level of Care/Admitting Diagnosis ED Disposition     ED Disposition  Admit   Condition  --   Comment  Hospital Area: [facility] [[id number]]  Level of Care: Med-Surg [16]  Covid Evaluation: Asymptomatic - no recent exposure (last 10 days) testing not required  Diagnosis: SBO (small bowel obstruction) (HCC) [781154]  Admitting Physician: HARLOW OZELL FORBES [[id number]]  Attending Physician: HARLOW OZELL FORBES [[id number]]  Certification:: I certify this patient will need inpatient services for at least 2 midnights  Expected Medical Readiness: 01/05/2024          B Medical/Surgery History Past Medical History:  Diagnosis Date   Adult failure to thrive    Blind    BPH (benign prostatic hyperplasia)    COPD (chronic obstructive pulmonary disease) (HCC)    Dysphagia    HIV (human immunodeficiency virus infection) (HCC)     Metabolic encephalopathy    Seizures (HCC)    Tobacco use    UTI (urinary tract infection)    Vitamin D  deficiency    Past Surgical History:  Procedure Laterality Date   HAND SURGERY       A IV Location/Drains/Wounds Patient Lines/Drains/Airways Status     Active Line/Drains/Airways     Name Placement date Placement time Site Days   Peripheral IV 12/30/23 20 G 1 Anterior;Left;Proximal Forearm 12/30/23  1333  Forearm  less than 1            Intake/Output Last 24 hours  Intake/Output Summary (Last 24 hours) at 12/30/2023 1912 Last data filed at 12/30/2023 1500 Gross per 24 hour  Intake 1000 ml  Output --  Net 1000 ml    Labs/Imaging Results for orders placed or performed during the hospital encounter of 12/30/23 (from the past 48 hours)  CBC     Status: Abnormal   Collection Time: 12/30/23  1:27 PM  Result Value Ref Range   WBC 13.0 (H) 4.0 - 10.5 K/uL   RBC 4.80 4.22 - 5.81 MIL/uL   Hemoglobin 14.6 13.0 - 17.0 g/dL   HCT 52.8 60.9 - 47.9 %   MCV 98.1 80.0 - 100.0 fL   MCH 30.4 26.0 - 34.0 pg   MCHC 31.0 30.0 - 36.0 g/dL   RDW 85.5 88.4 - 84.4 %   Platelets 137 (L) 150 - 400 K/uL    Comment: REPEATED TO VERIFY   nRBC 0.0 0.0 - 0.2 %    Comment: Performed  at [facility], [address] [place], [place] [address]  Comprehensive metabolic panel     Status: Abnormal   Collection Time: 12/30/23  1:27 PM  Result Value Ref Range   Sodium 139 135 - 145 mmol/L   Potassium 3.6 3.5 - 5.1 mmol/L   Chloride 106 98 - 111 mmol/L   CO2 21 (L) 22 - 32 mmol/L   Glucose, Bld 138 (H) 70 - 99 mg/dL    Comment: Glucose reference range applies only to samples taken after fasting for at least 8 hours.   BUN 29 (H) 8 - 23 mg/dL   Creatinine, Ser 8.34 (H) 0.61 - 1.24 mg/dL   Calcium  13.7 (HH) 8.9 - 10.3 mg/dL    Comment: CRITICAL RESULT CALLED TO, READ BACK BY AND VERIFIED WITH ANGIE FLETCHER @ 1415 ON 12/30/23 C VARNER   Total Protein 9.4 (H) 6.5 - 8.1 g/dL   Albumin 4.1  3.5 - 5.0 g/dL   AST 32 15 - 41 U/L   ALT 35 0 - 44 U/L   Alkaline Phosphatase 64 38 - 126 U/L   Total Bilirubin 0.5 0.0 - 1.2 mg/dL   GFR, Estimated 44 (L) >60 mL/min    Comment: (NOTE) Calculated using the CKD-EPI Creatinine Equation (2021)    Anion gap 12 5 - 15    Comment: Performed at [facility], [address] [place], [place] [address]  Lipase, blood     Status: None   Collection Time: 12/30/23  1:27 PM  Result Value Ref Range   Lipase 18 11 - 51 U/L    Comment: Performed at [facility], [address], [place], [place] [address]  Lactic acid, plasma     Status: Abnormal   Collection Time: 12/30/23  1:27 PM  Result Value Ref Range   Lactic Acid, Venous 4.4 (HH) 0.5 - 1.9 mmol/L    Comment: CRITICAL RESULT CALLED TO, READ BACK BY AND VERIFIED WITH DOUG Arturo Freundlich @ 1410 ON 12/30/23 C VARNER Performed at [facility], [address], [place], [place] [address]    CT ABDOMEN PELVIS W CONTRAST Result Date: 12/30/2023 CLINICAL DATA:  Nausea and vomiting, possible aspiration, concern for bowel obstruction EXAM: CT ABDOMEN AND PELVIS WITH CONTRAST TECHNIQUE: Multidetector CT imaging of the abdomen and pelvis was performed using the standard protocol following bolus administration of intravenous contrast. RADIATION DOSE REDUCTION: This exam was performed according to the departmental dose-optimization program which includes automated exposure control, adjustment of the mA and/or kV according to patient size and/or use of iterative reconstruction technique. CONTRAST:  75mL OMNIPAQUE  IOHEXOL  300 MG/ML  SOLN COMPARISON:  08/03/2022 FINDINGS: Lower chest: Persistent area of subpleural rounded consolidation within the left lower lobe posterior costophrenic angle, consistent with rounded atelectasis or scarring. In addition, there is new ground-glass airspace disease within the more superior dependent left lower lobe, which could reflect aspiration given clinical presentation. Hepatobiliary: Small  calcified gallstones without cholecystitis. The liver is unremarkable. No biliary duct dilation. Pancreas: Unremarkable. No pancreatic ductal dilatation or surrounding inflammatory changes. Spleen: Normal in size without focal abnormality. Adrenals/Urinary Tract: Simple appearing bilateral renal cortical cysts are again noted, which do not require specific imaging follow-up. There are too numerous to count bilateral nonobstructing renal calculi, which are new since prior CT. Largest calculus on the right measures up to 4 mm. Largest calculus on the left within the renal pelvis measures up to 12 mm. No hydronephrosis or hydroureter. A large bladder calculus is again identified, measuring up  to 3.8 cm in size. Bladder is otherwise unremarkable. The adrenals are normal. Stomach/Bowel: There is a large amount of retained stool within the rectal vault and rectosigmoid colon, consistent with fecal impaction. Associated rectal wall thickening compatible with an element of stercoral colitis. There is also evidence of small bowel obstruction, with numerous distended loops of small bowel throughout the abdomen and pelvis, measuring up to 3.8 cm in size. There are several loops of decompressed small bowel within the right upper abdomen image 30/2 and right mid abdomen image 39/2, which could be the transition points. Marked gastric distension is also noted, and decompression with enteric catheter should be considered in this patient with clinical and imaging features concerning for aspiration. Vascular/Lymphatic: Aortic atherosclerosis. No enlarged abdominal or pelvic lymph nodes. Reproductive: Prostate is unremarkable. Other: No free fluid or free intraperitoneal gas. No abdominal wall hernia. Musculoskeletal: No acute or destructive bony abnormalities. Reconstructed images demonstrate no additional findings. IMPRESSION: 1. Small-bowel obstruction, with transition point likely within the right upper or right mid abdomen where  several loops of decompressed small bowel are noted. 2. Marked gastric distension. Decompression with enteric catheter should be considered in this patient with clinical and imaging findings concerning for aspiration. 3. Chronic left lower lobe scarring or atelectasis, with new ground-glass airspace disease concerning for aspiration. 4. Large amount of retained stool within the rectum and distal rectosigmoid colon, consistent with fecal impaction. Rectal wall thickening compatible with stercoral colitis. 5. Numerous bilateral nonobstructing renal calculi, new since prior study. Large bladder calculus, increased in size since prior exam. 6. Cholelithiasis without cholecystitis. 7.  Aortic Atherosclerosis (ICD10-I70.0). Electronically Signed   By: Ozell Daring M.D.   On: 12/30/2023 15:21   DG Chest Portable 1 View Result Date: 12/30/2023 CLINICAL DATA:  Nausea and vomiting, aspiration EXAM: PORTABLE CHEST 1 VIEW COMPARISON:  09/06/2023 FINDINGS: Single frontal view of the chest demonstrates chronic elevation of the left hemidiaphragm. The cardiac silhouette is unremarkable. Chronic scarring at the left base. No acute airspace disease, effusion, or pneumothorax. Bullous emphysematous changes are again seen at the apices. No acute bony abnormality. IMPRESSION: 1. No acute intrathoracic process. 2. Chronic elevation of the left hemidiaphragm with stable left lower lobe scarring or atelectasis. 3. Stable emphysema. Electronically Signed   By: Ozell Daring M.D.   On: 12/30/2023 15:04    Pending Labs Unresulted Labs (From admission, onward)     Start     Ordered   12/31/23 0500  Basic metabolic panel  Tomorrow morning,   R        12/30/23 1827   12/31/23 0500  CBC  Tomorrow morning,   R        12/30/23 1827   12/30/23 1435  Urinalysis, w/ Reflex to Culture (Infection Suspected) -Urine, Clean Catch  Once,   URGENT       Question:  Specimen Source  Answer:  Urine, Clean Catch   12/30/23 1434   12/30/23  1248  Blood culture (routine x 2)  BLOOD CULTURE X 2,   R (with STAT occurrences)      12/30/23 1247            Vitals/Pain Today's Vitals   12/30/23 1217 12/30/23 1225 12/30/23 1228  BP:  110/88   Pulse:  (!) 115   Resp:  (!) 21   Temp:   (!) 97.4 F (36.3 C)  TempSrc:   Axillary  SpO2:  95%   PainSc: 0-No pain  Isolation Precautions No active isolations  Medications Medications  piperacillin -tazobactam (ZOSYN ) IVPB 3.375 g (3.375 g Intravenous New Bag/Given 12/30/23 1821)  sodium phosphate (FLEET) enema 1 enema (has no administration in time range)  Darunavir -Cobicistat-Emtricitabine -Tenofovir  Alafenamide (SYMTUZA ) 800-150-200-10 MG TABS 1 tablet (has no administration in time range)  rosuvastatin  (CRESTOR ) tablet 10 mg (has no administration in time range)  ondansetron  (ZOFRAN ) tablet 4 mg (has no administration in time range)  tamsulosin  (FLOMAX ) capsule 0.4 mg (has no administration in time range)  diatrizoate  meglumine -sodium (GASTROGRAFIN ) 66-10 % solution 90 mL (has no administration in time range)  heparin  injection 5,000 Units (has no administration in time range)  dextrose  5 % and 0.45 % NaCl infusion (has no administration in time range)  acetaminophen  (TYLENOL ) tablet 650 mg (has no administration in time range)    Or  acetaminophen  (TYLENOL ) suppository 650 mg (has no administration in time range)  HYDROmorphone  (DILAUDID ) injection 0.5-1 mg (has no administration in time range)  bisacodyl  (DULCOLAX) suppository 10 mg (has no administration in time range)  sodium chloride  0.9 % bolus 1,000 mL (0 mLs Intravenous Stopped 12/30/23 1500)  sodium chloride  0.9 % bolus 1,000 mL (1,000 mLs Intravenous New Bag/Given 12/30/23 1612)  vancomycin  (VANCOCIN ) IVPB 1000 mg/200 mL premix (1,000 mg Intravenous New Bag/Given 12/30/23 1613)  iohexol  (OMNIPAQUE ) 300 MG/ML solution 75 mL (75 mLs Intravenous Contrast Given 12/30/23 1440)    Mobility manual wheelchair         R Recommendations: See Admitting Provider Note  Report given to:

## 2023-12-30 NOTE — Assessment & Plan Note (Signed)
Chronic hypercalcemia. He is known to have a parathyroid adenoma. May have seen Dr. Gerrit Friends in the past but no record of surgery found

## 2023-12-31 ENCOUNTER — Inpatient Hospital Stay (HOSPITAL_COMMUNITY): Payer: 59

## 2023-12-31 DIAGNOSIS — B20 Human immunodeficiency virus [HIV] disease: Secondary | ICD-10-CM

## 2023-12-31 DIAGNOSIS — H409 Unspecified glaucoma: Secondary | ICD-10-CM | POA: Diagnosis not present

## 2023-12-31 DIAGNOSIS — J4489 Other specified chronic obstructive pulmonary disease: Secondary | ICD-10-CM

## 2023-12-31 DIAGNOSIS — K56609 Unspecified intestinal obstruction, unspecified as to partial versus complete obstruction: Secondary | ICD-10-CM | POA: Diagnosis not present

## 2023-12-31 LAB — CBC
HCT: 40.4 % (ref 39.0–52.0)
Hemoglobin: 13 g/dL (ref 13.0–17.0)
MCH: 30.8 pg (ref 26.0–34.0)
MCHC: 32.2 g/dL (ref 30.0–36.0)
MCV: 95.7 fL (ref 80.0–100.0)
Platelets: 117 10*3/uL — ABNORMAL LOW (ref 150–400)
RBC: 4.22 MIL/uL (ref 4.22–5.81)
RDW: 14.5 % (ref 11.5–15.5)
WBC: 11.4 10*3/uL — ABNORMAL HIGH (ref 4.0–10.5)
nRBC: 0 % (ref 0.0–0.2)

## 2023-12-31 LAB — URINALYSIS, W/ REFLEX TO CULTURE (INFECTION SUSPECTED)
Bilirubin Urine: NEGATIVE
Glucose, UA: NEGATIVE mg/dL
Ketones, ur: NEGATIVE mg/dL
Nitrite: NEGATIVE
Protein, ur: 100 mg/dL — AB
Specific Gravity, Urine: 1.043 — ABNORMAL HIGH (ref 1.005–1.030)
WBC, UA: >50 WBC/hpf (ref 0–5)
pH: 5 (ref 5.0–8.0)

## 2023-12-31 LAB — BASIC METABOLIC PANEL
Anion gap: 9 (ref 5–15)
BUN: 26 mg/dL — ABNORMAL HIGH (ref 8–23)
CO2: 23 mmol/L (ref 22–32)
Calcium: 11.9 mg/dL — ABNORMAL HIGH (ref 8.9–10.3)
Chloride: 112 mmol/L — ABNORMAL HIGH (ref 98–111)
Creatinine, Ser: 1.34 mg/dL — ABNORMAL HIGH (ref 0.61–1.24)
GFR, Estimated: 56 mL/min — ABNORMAL LOW (ref >60)
Glucose, Bld: 137 mg/dL — ABNORMAL HIGH (ref 70–99)
Potassium: 3.5 mmol/L (ref 3.5–5.1)
Sodium: 144 mmol/L (ref 135–145)

## 2023-12-31 MED ORDER — ONDANSETRON HCL 4 MG/2ML IJ SOLN
4.0000 mg | Freq: Four times a day (QID) | INTRAMUSCULAR | Status: DC | PRN
  Administered 2023-12-31: 4 mg via INTRAVENOUS
  Filled 2023-12-31: qty 2

## 2023-12-31 NOTE — TOC Initial Note (Signed)
 Transition of Care [facility]) - Initial/Assessment Note    Patient Details  Name: [name] MRN: [id number] Date of Birth: Dec 07, 1951  [facility] CM/SW Contact:    Mcarthur Saddie Kim, LCSW Phone Number: 12/31/2023, 9:00 AM  Clinical Narrative: Pt admitted due to small bowel obstruction. Per [facility] at [facility], pt is long term resident and okay to return. LCSW left voicemail for pt's brother, Vivienne requesting return call. LCSW spoke with cousin, Levander who indicates he is on the way to see pt soon. Anticipate plan to return to [facility] when medically stable. TOC will follow.                    Expected Discharge Plan: Long Term Nursing Home Barriers to Discharge: Continued Medical Work up   Patient Goals and CMS Choice Patient states their goals for this hospitalization and ongoing recovery are:: anticipate return to SNF LTC     [facility] ownership interest in [facility].provided to::  (n/a)    Expected Discharge Plan and Services In-house Referral: Clinical Social Work   Post Acute Care Choice: Resumption of Svcs/PTA Provider Living arrangements for the past 2 months: Skilled Nursing Facility                                      Prior Living Arrangements/Services Living arrangements for the past 2 months: Skilled Nursing Facility Lives with:: Facility Resident Patient language and need for interpreter reviewed:: Yes Do you feel safe going back to the place where you live?: Yes        Care giver support system in place?: Yes (comment)   Criminal Activity/Legal Involvement Pertinent to Current Situation/Hospitalization: No - Comment as needed  Activities of Daily Living   ADL Screening (condition at time of admission) Independently performs ADLs?: No Does the patient have a NEW difficulty with bathing/dressing/toileting/self-feeding that is expected to last >3 days?: No Does the patient have a NEW difficulty with  getting in/out of bed, walking, or climbing stairs that is expected to last >3 days?: No Does the patient have a NEW difficulty with communication that is expected to last >3 days?: No Is the patient deaf or have difficulty hearing?: Yes Does the patient have difficulty seeing, even when wearing glasses/contacts?: Yes Does the patient have difficulty concentrating, remembering, or making decisions?: No  Permission Sought/Granted                  Emotional Assessment         Alcohol / Substance Use: Not Applicable Psych Involvement: No (comment)  Admission diagnosis:  Small bowel obstruction (HCC) [K56.609] SBO (small bowel obstruction) (HCC) [K56.609] Sepsis, due to unspecified organism, unspecified whether acute organ dysfunction present [facility]) [A41.9] Patient Active Problem List   Diagnosis Date Noted   SBO (small bowel obstruction) (HCC) 12/30/2023   Glaucoma 12/30/2023   Dysuria 07/07/2023   At increased risk for cardiovascular disease 07/07/2023   Vision changes 09/25/2022   Hypovitaminosis D 08/10/2022   Hypernatremia 08/08/2022   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   Failure to thrive in adult 08/04/2022   Acute metabolic encephalopathy 08/04/2022   Hypercalcemia 08/04/2022   UTI (urinary tract infection) 08/04/2022   Dysarthria 08/04/2022   Urinary incontinence    Fracture, [place], right, closed 01/20/2020   Healthcare maintenance 06/17/2019   Tobacco use 06/17/2019   Infestation by bed bug 11/02/2018  Underweight 11/02/2018   ERECTILE DYSFUNCTION 04/13/2008   Dental caries 08/19/2007   AIDS (acquired immune deficiency syndrome) (HCC) 10/22/2006   Genital herpes 10/22/2006   PNEUMOCYSTIS PNEUMONIA 10/22/2006   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   SEIZURE DISORDER 10/22/2006   PCP:  Pcp, No Pharmacy:   [id number] - [place], [place] - [address] [place] [address] Phone: [phone] Fax: [phone]  [place] [facility] [address] [place] [address] Phone: [phone] Fax: [phone]  [facility] - [place], [place] - [address] [place] [address] Phone: [phone] Fax: [phone]     Social Drivers of Health (SDOH) Social History: SDOH Screenings   Food Insecurity: No Food Insecurity (12/30/2023)  Housing: Low Risk  (12/30/2023)  Transportation Needs: No Transportation Needs (12/30/2023)  Utilities: Not At Risk (12/30/2023)  Depression (PHQ2-9): Low Risk  (07/07/2023)  Social Connections: Moderately Isolated (12/30/2023)  Tobacco Use: High Risk (12/30/2023)   SDOH Interventions:     Readmission Risk Interventions     No data to display

## 2023-12-31 NOTE — Consult Note (Signed)
 Reason for Consult: Bowel obstruction, obstipation Referring Physician: Dr. Ricky Victory [name] is an 73 y.o. male.  HPI: Patient is a 73 year old black male with multiple medical problems including COPD, HIV, metabolic encephalopathy, history of seizures, and failure to thrive who was sent by long-term care facility to [facility] emergency room for ongoing nausea, emesis, and a cough.  There was a concern that he aspirated.  History obtained from the chart as patient is a poor historian.  CT scan shows a large amount of retained stool in the rectal vault along with dilated bowel loops in the upper abdomen with some there are collapsed.  No pneumoperitoneum is noted.  Gastric distention is also noted.  Patient had an NG tube placed but he is removed twice.  Is currently in place.  Patient does not report any abdominal pain.  Past Medical History:  Diagnosis Date   Adult failure to thrive    Blind    BPH (benign prostatic hyperplasia)    COPD (chronic obstructive pulmonary disease) (HCC)    Dysphagia    HIV (human immunodeficiency virus infection) (HCC)    Metabolic encephalopathy    Seizures (HCC)    Tobacco use    UTI (urinary tract infection)    Vitamin D  deficiency     Past Surgical History:  Procedure Laterality Date   HAND SURGERY      Family History  Problem Relation Age of Onset   High blood pressure Mother     Social History:  reports that he has been smoking cigarettes. He has never used smokeless tobacco. He reports that he does not drink alcohol and does not use drugs.  Allergies:  Allergies  Allergen Reactions   Penicillins Hives    Medications: I have reviewed the patient's current medications. Prior to Admission:  Medications Prior to Admission  Medication Sig Dispense Refill Last Dose/Taking   acetaminophen  (TYLENOL ) 500 MG tablet Take 500 mg by mouth in the morning, at noon, and at bedtime. Left should and left upper arm pain   12/30/2023 Morning    Cholecalciferol 25 MCG (1000 UT) capsule Take 1,000 Units by mouth daily.   12/30/2023 Morning   Darunavir -Cobicistat-Emtricitabine -Tenofovir  Alafenamide (SYMTUZA ) 800-150-200-10 MG TABS Take 1 tablet by mouth daily. 30 tablet 5 12/30/2023 Morning   feeding supplement (ENSURE ENLIVE / ENSURE PLUS) LIQD Take 237 mLs by mouth 3 (three) times daily between meals. 237 mL 12 12/30/2023 Noon   hyoscyamine (ANASPAZ) 0.125 MG TBDP disintergrating tablet Place 0.125 mg under the tongue every 4 (four) hours as needed (dysuria, bladder dysfunction).   Taking As Needed   latanoprost  (XALATAN ) 0.005 % ophthalmic solution Place 1 drop into both eyes at bedtime.   12/29/2023 Bedtime   loperamide (IMODIUM A-D) 2 MG tablet Take 2 mg by mouth 4 (four) times daily as needed for diarrhea or loose stools.   Taking As Needed   Nutritional Supplements (NUTRITIONAL SUPPLEMENT PO) Take 1 Bar by mouth daily. Frozen nutritional supplement   12/30/2023 Noon   ondansetron  (ZOFRAN ) 4 MG tablet Take 1 tablet (4 mg total) by mouth every 6 (six) hours as needed for nausea. 20 tablet 0 Taking As Needed   rosuvastatin  (CRESTOR ) 10 MG tablet Take 1 tablet (10 mg total) by mouth daily. 30 tablet 5 12/30/2023 Morning   tamsulosin  (FLOMAX ) 0.4 MG CAPS capsule Take 0.4 mg by mouth daily.   12/29/2023 Evening   valACYclovir  (VALTREX ) 500 MG tablet Take 500 mg by mouth daily.  12/30/2023 Morning    Results for orders placed or performed during the hospital encounter of 12/30/23 (from the past 48 hours)  CBC     Status: Abnormal   Collection Time: 12/30/23  1:27 PM  Result Value Ref Range   WBC 13.0 (H) 4.0 - 10.5 K/uL   RBC 4.80 4.22 - 5.81 MIL/uL   Hemoglobin 14.6 13.0 - 17.0 g/dL   HCT 52.8 60.9 - 47.9 %   MCV 98.1 80.0 - 100.0 fL   MCH 30.4 26.0 - 34.0 pg   MCHC 31.0 30.0 - 36.0 g/dL   RDW 85.5 88.4 - 84.4 %   Platelets 137 (L) 150 - 400 K/uL    Comment: REPEATED TO VERIFY   nRBC 0.0 0.0 - 0.2 %    Comment: Performed at [facility], [address] [place], [place] [address]  Comprehensive metabolic panel     Status: Abnormal   Collection Time: 12/30/23  1:27 PM  Result Value Ref Range   Sodium 139 135 - 145 mmol/L   Potassium 3.6 3.5 - 5.1 mmol/L   Chloride 106 98 - 111 mmol/L   CO2 21 (L) 22 - 32 mmol/L   Glucose, Bld 138 (H) 70 - 99 mg/dL    Comment: Glucose reference range applies only to samples taken after fasting for at least 8 hours.   BUN 29 (H) 8 - 23 mg/dL   Creatinine, Ser 8.34 (H) 0.61 - 1.24 mg/dL   Calcium  13.7 (HH) 8.9 - 10.3 mg/dL    Comment: CRITICAL RESULT CALLED TO, READ BACK BY AND VERIFIED WITH ANGIE FLETCHER @ 1415 ON 12/30/23 C VARNER   Total Protein 9.4 (H) 6.5 - 8.1 g/dL   Albumin 4.1 3.5 - 5.0 g/dL   AST 32 15 - 41 U/L   ALT 35 0 - 44 U/L   Alkaline Phosphatase 64 38 - 126 U/L   Total Bilirubin 0.5 0.0 - 1.2 mg/dL   GFR, Estimated 44 (L) >60 mL/min    Comment: (NOTE) Calculated using the CKD-EPI Creatinine Equation (2021)    Anion gap 12 5 - 15    Comment: Performed at [facility], [address] [place], [place] [address]  Lipase, blood     Status: None   Collection Time: 12/30/23  1:27 PM  Result Value Ref Range   Lipase 18 11 - 51 U/L    Comment: Performed at [facility], [address], [place], [place] [address]  Lactic acid, plasma     Status: Abnormal   Collection Time: 12/30/23  1:27 PM  Result Value Ref Range   Lactic Acid, Venous 4.4 (HH) 0.5 - 1.9 mmol/L    Comment: CRITICAL RESULT CALLED TO, READ BACK BY AND VERIFIED WITH DOUG AVERY @ 1410 ON 12/30/23 JAYSON BUCK Performed at [facility], [address] [place], [place] [address]   Blood culture (routine x 2)     Status: None (Preliminary result)   Collection Time: 12/30/23  1:27 PM   Specimen: BLOOD  Result Value Ref Range   Specimen Description BLOOD RW    Special Requests      BOTTLES DRAWN AEROBIC ONLY Blood Culture results may not be optimal due to an inadequate volume of blood received in culture  bottles   Culture      NO GROWTH < 24 HOURS Performed at [facility], [address], [place], [place] [address]    Report Status PENDING   Blood culture (routine x 2)  Status: None (Preliminary result)   Collection Time: 12/30/23  1:44 PM   Specimen: BLOOD  Result Value Ref Range   Specimen Description BLOOD LEFT ARM    Special Requests      BOTTLES DRAWN AEROBIC AND ANAEROBIC Blood Culture adequate volume   Culture      NO GROWTH < 24 HOURS Performed at [facility], [address], [place], [place] [address]    Report Status PENDING   MRSA Next Gen by PCR, Nasal     Status: None   Collection Time: 12/30/23  9:20 PM   Specimen: Nasal Mucosa; Nasal Swab  Result Value Ref Range   MRSA by PCR Next Gen NOT DETECTED NOT DETECTED    Comment: (NOTE) The GeneXpert MRSA Assay (FDA approved for NASAL specimens only), is one component of a comprehensive MRSA colonization surveillance program. It is not intended to diagnose MRSA infection nor to guide or monitor treatment for MRSA infections. Test performance is not FDA approved in patients less than 66 years old. Performed at [facility], [address] [place], [place] [address]   Urinalysis, w/ Reflex to Culture (Infection Suspected) -Urine, Clean Catch     Status: Abnormal   Collection Time: 12/31/23  1:28 AM  Result Value Ref Range   Specimen Source URINE, CLEAN CATCH    Color, Urine YELLOW YELLOW   APPearance CLOUDY (A) CLEAR   Specific Gravity, Urine 1.043 (H) 1.005 - 1.030   pH 5.0 5.0 - 8.0   Glucose, UA NEGATIVE NEGATIVE mg/dL   Hgb urine dipstick MODERATE (A) NEGATIVE   Bilirubin Urine NEGATIVE NEGATIVE   Ketones, ur NEGATIVE NEGATIVE mg/dL   Protein, ur 899 (A) NEGATIVE mg/dL   Nitrite NEGATIVE NEGATIVE   Leukocytes,Ua LARGE (A) NEGATIVE   RBC / HPF 21-50 0 - 5 RBC/hpf   WBC, UA >50 0 - 5 WBC/hpf    Comment:        Reflex urine culture not performed if WBC <=10, OR if Squamous epithelial cells >5. If Squamous  epithelial cells >5 suggest recollection.    Bacteria, UA RARE (A) NONE SEEN   Squamous Epithelial / HPF 0-5 0 - 5 /HPF   WBC Clumps PRESENT    Mucus PRESENT    Ca Oxalate Crys, UA PRESENT     Comment: Performed at [facility], [address], [place], [place] [address]  Basic metabolic panel     Status: Abnormal   Collection Time: 12/31/23  4:37 AM  Result Value Ref Range   Sodium 144 135 - 145 mmol/L   Potassium 3.5 3.5 - 5.1 mmol/L   Chloride 112 (H) 98 - 111 mmol/L   CO2 23 22 - 32 mmol/L   Glucose, Bld 137 (H) 70 - 99 mg/dL    Comment: Glucose reference range applies only to samples taken after fasting for at least 8 hours.   BUN 26 (H) 8 - 23 mg/dL   Creatinine, Ser 8.65 (H) 0.61 - 1.24 mg/dL   Calcium  11.9 (H) 8.9 - 10.3 mg/dL   GFR, Estimated 56 (L) >60 mL/min    Comment: (NOTE) Calculated using the CKD-EPI Creatinine Equation (2021)    Anion gap 9 5 - 15    Comment: Performed at [facility], [address], [place], [place] [address]  CBC     Status: Abnormal   Collection Time: 12/31/23  4:37 AM  Result Value Ref Range   WBC 11.4 (H) 4.0 - 10.5 K/uL   RBC 4.22  4.22 - 5.81 MIL/uL   Hemoglobin 13.0 13.0 - 17.0 g/dL   HCT 59.5 60.9 - 47.9 %   MCV 95.7 80.0 - 100.0 fL   MCH 30.8 26.0 - 34.0 pg   MCHC 32.2 30.0 - 36.0 g/dL   RDW 85.4 88.4 - 84.4 %   Platelets 117 (L) 150 - 400 K/uL   nRBC 0.0 0.0 - 0.2 %    Comment: Performed at [facility], [address] [place], [place] [address]    DG Abd 1 View Result Date: 12/31/2023 CLINICAL DATA:  Status post NG tube placement. EXAM: ABDOMEN - 1 VIEW COMPARISON:  Earlier same day FINDINGS: NG tube is in the stomach with proximal side port below the GE junction. Diffuse gaseous distention of small bowel and colon again noted. IMPRESSION: NG tube is in the stomach. Electronically Signed   By: Camellia Candle M.D.   On: 12/31/2023 08:22   Abd 1 View (KUB) Result Date: 12/31/2023 CLINICAL DATA:  NG tube placement. EXAM: ABDOMEN - 1  VIEW COMPARISON:  CT yesterday FINDINGS: Tip and side port of the enteric tube below the diaphragm in the stomach. Decreased gaseous gastric distension. Dilated bowel loops in the upper abdomen persist. Elevated left hemidiaphragm as before. IMPRESSION: Tip and side port of the enteric tube below the diaphragm in the stomach. Decreased gaseous gastric distension. Electronically Signed   By: Andrea Gasman M.D.   On: 12/31/2023 03:54   CT ABDOMEN PELVIS W CONTRAST Result Date: 12/30/2023 CLINICAL DATA:  Nausea and vomiting, possible aspiration, concern for bowel obstruction EXAM: CT ABDOMEN AND PELVIS WITH CONTRAST TECHNIQUE: Multidetector CT imaging of the abdomen and pelvis was performed using the standard protocol following bolus administration of intravenous contrast. RADIATION DOSE REDUCTION: This exam was performed according to the departmental dose-optimization program which includes automated exposure control, adjustment of the mA and/or kV according to patient size and/or use of iterative reconstruction technique. CONTRAST:  75mL OMNIPAQUE  IOHEXOL  300 MG/ML  SOLN COMPARISON:  08/03/2022 FINDINGS: Lower chest: Persistent area of subpleural rounded consolidation within the left lower lobe posterior costophrenic angle, consistent with rounded atelectasis or scarring. In addition, there is new ground-glass airspace disease within the more superior dependent left lower lobe, which could reflect aspiration given clinical presentation. Hepatobiliary: Small calcified gallstones without cholecystitis. The liver is unremarkable. No biliary duct dilation. Pancreas: Unremarkable. No pancreatic ductal dilatation or surrounding inflammatory changes. Spleen: Normal in size without focal abnormality. Adrenals/Urinary Tract: Simple appearing bilateral renal cortical cysts are again noted, which do not require specific imaging follow-up. There are too numerous to count bilateral nonobstructing renal calculi, which are  new since prior CT. Largest calculus on the right measures up to 4 mm. Largest calculus on the left within the renal pelvis measures up to 12 mm. No hydronephrosis or hydroureter. A large bladder calculus is again identified, measuring up to 3.8 cm in size. Bladder is otherwise unremarkable. The adrenals are normal. Stomach/Bowel: There is a large amount of retained stool within the rectal vault and rectosigmoid colon, consistent with fecal impaction. Associated rectal wall thickening compatible with an element of stercoral colitis. There is also evidence of small bowel obstruction, with numerous distended loops of small bowel throughout the abdomen and pelvis, measuring up to 3.8 cm in size. There are several loops of decompressed small bowel within the right upper abdomen image 30/2 and right mid abdomen image 39/2, which could be the transition points. Marked gastric distension is also noted, and decompression with  enteric catheter should be considered in this patient with clinical and imaging features concerning for aspiration. Vascular/Lymphatic: Aortic atherosclerosis. No enlarged abdominal or pelvic lymph nodes. Reproductive: Prostate is unremarkable. Other: No free fluid or free intraperitoneal gas. No abdominal wall hernia. Musculoskeletal: No acute or destructive bony abnormalities. Reconstructed images demonstrate no additional findings. IMPRESSION: 1. Small-bowel obstruction, with transition point likely within the right upper or right mid abdomen where several loops of decompressed small bowel are noted. 2. Marked gastric distension. Decompression with enteric catheter should be considered in this patient with clinical and imaging findings concerning for aspiration. 3. Chronic left lower lobe scarring or atelectasis, with new ground-glass airspace disease concerning for aspiration. 4. Large amount of retained stool within the rectum and distal rectosigmoid colon, consistent with fecal impaction. Rectal  wall thickening compatible with stercoral colitis. 5. Numerous bilateral nonobstructing renal calculi, new since prior study. Large bladder calculus, increased in size since prior exam. 6. Cholelithiasis without cholecystitis. 7.  Aortic Atherosclerosis (ICD10-I70.0). Electronically Signed   By: Ozell Daring M.D.   On: 12/30/2023 15:21   DG Chest Portable 1 View Result Date: 12/30/2023 CLINICAL DATA:  Nausea and vomiting, aspiration EXAM: PORTABLE CHEST 1 VIEW COMPARISON:  09/06/2023 FINDINGS: Single frontal view of the chest demonstrates chronic elevation of the left hemidiaphragm. The cardiac silhouette is unremarkable. Chronic scarring at the left base. No acute airspace disease, effusion, or pneumothorax. Bullous emphysematous changes are again seen at the apices. No acute bony abnormality. IMPRESSION: 1. No acute intrathoracic process. 2. Chronic elevation of the left hemidiaphragm with stable left lower lobe scarring or atelectasis. 3. Stable emphysema. Electronically Signed   By: Ozell Daring M.D.   On: 12/30/2023 15:04    ROS:  Review of systems not obtained due to patient factors.  Blood pressure (!) 108/59, pulse 96, temperature 97.9 F (36.6 C), temperature source Axillary, resp. rate 18, SpO2 100%. Physical Exam: Cachectic black male in no acute distress Abdomen soft and flat.  Nontender.  Occasional bowel sounds appreciated.  No surgical scars present. Patient refused digital rectal examination  CT scan images personally reviewed  Assessment/Plan: Impression: Partial small bowel obstruction versus ileus.  Patient does not have an apparent abdominal surgical history.  He does need disimpaction but he refused.  Would continue NG tube decompression.  Would consider enemas if patient agreeable.  No need for acute surgical intervention at the present time.  Oneil Budge 12/31/2023, 10:26 AM

## 2023-12-31 NOTE — Progress Notes (Signed)
 NG tube replaced in rt nare @64cm . Waiting for x-ray verification

## 2023-12-31 NOTE — NC FL2 (Signed)
 [place]  MEDICAID [id number] LEVEL OF CARE FORM     IDENTIFICATION  Patient Name: TYREIK DELAHOUSSAYE Birthdate: 03/26/1951 Sex: male Admission Date (Current Location): 12/30/2023  [place] and [place] Number:  [place] [id number] Facility and Address:  [facility],  [address], [place] [address]      Provider Number: [id number]  Attending Physician Name and Address:  Ricky Fines, MD  Relative Name and Phone Number:       Current Level of Care: Hospital Recommended Level of Care: Skilled Nursing Facility Prior Approval Number:    Date Approved/Denied:   PASRR Number:    Discharge Plan: SNF    Current Diagnoses: Patient Active Problem List   Diagnosis Date Noted   SBO (small bowel obstruction) (HCC) 12/30/2023   Glaucoma 12/30/2023   Dysuria 07/07/2023   At increased risk for cardiovascular disease 07/07/2023   Vision changes 09/25/2022   Hypovitaminosis D 08/10/2022   Hypernatremia 08/08/2022   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   Failure to thrive in adult 08/04/2022   Acute metabolic encephalopathy 08/04/2022   Hypercalcemia 08/04/2022   UTI (urinary tract infection) 08/04/2022   Dysarthria 08/04/2022   Urinary incontinence    Fracture, [place], right, closed 01/20/2020   Healthcare maintenance 06/17/2019   Tobacco use 06/17/2019   Infestation by bed bug 11/02/2018   Underweight 11/02/2018   ERECTILE DYSFUNCTION 04/13/2008   Dental caries 08/19/2007   AIDS (acquired immune deficiency syndrome) (HCC) 10/22/2006   Genital herpes 10/22/2006   PNEUMOCYSTIS PNEUMONIA 10/22/2006   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   SEIZURE DISORDER 10/22/2006    Orientation RESPIRATION BLADDER Height & Weight     Self, Place  Normal Continent Weight:   Height:     BEHAVIORAL SYMPTOMS/MOOD NEUROLOGICAL BOWEL NUTRITION STATUS    Convulsions/Seizures Incontinent Diet (See d/c summary)  AMBULATORY STATUS COMMUNICATION OF  NEEDS Skin   Extensive Assist Verbally Other (Comment) (Blister to right great toe)                       Personal Care Assistance Level of Assistance  Bathing, Feeding, Dressing Bathing Assistance: Maximum assistance Feeding assistance: Limited assistance Dressing Assistance: Maximum assistance     Functional Limitations Info  Hearing, Sight, Speech Sight Info: Impaired Hearing Info: Impaired Speech Info: Adequate    SPECIAL CARE FACTORS FREQUENCY                       Contractures      Additional Factors Info  Code Status, Allergies, Isolation Precautions Code Status Info: Full code Allergies Info: Penicillins     Isolation Precautions Info: Enteric precautions     Current Medications (12/31/2023):  This is the current hospital active medication list Current Facility-Administered Medications  Medication Dose Route Frequency Provider Last Rate Last Admin   acetaminophen  (TYLENOL ) tablet 650 mg  650 mg Oral Q6H PRN Norins, Michael E, MD       Or   acetaminophen  (TYLENOL ) suppository 650 mg  650 mg Rectal Q6H PRN Norins, Michael E, MD       bisacodyl  (DULCOLAX) suppository 10 mg  10 mg Rectal Daily PRN Norins, Michael E, MD       diatrizoate  meglumine -sodium (GASTROGRAFIN ) 66-10 % solution 90 mL  90 mL Per NG tube Once Norins, Michael E, MD       heparin  injection 5,000 Units  5,000 Units Subcutaneous Q8H Norins, Michael E, MD   5,000 Units at  12/31/23 0625   HYDROmorphone  (DILAUDID ) injection 0.5-1 mg  0.5-1 mg Intravenous Q2H PRN Norins, Michael E, MD       ondansetron  (ZOFRAN ) injection 4 mg  4 mg Intravenous Q6H PRN Adefeso, Oladapo, DO   4 mg at 12/31/23 0205   Oral care mouth rinse  15 mL Mouth Rinse PRN Norins, Michael E, MD       piperacillin -tazobactam (ZOSYN ) IVPB 3.375 g  3.375 g Intravenous Q8H Tanda Dempsey SAUNDERS, RPH 12.5 mL/hr at 12/31/23 0643 3.375 g at 12/31/23 9356   sodium phosphate (FLEET) enema 1 enema  1 enema Rectal Once Neysa Caron PARAS, DO          Discharge Medications: Please see discharge summary for a list of discharge medications.  Relevant Imaging Results:  Relevant Lab Results:   Additional Information    [place] Saddie Kim, LCSW

## 2023-12-31 NOTE — Plan of Care (Signed)

## 2023-12-31 NOTE — Progress Notes (Signed)
  Progress Note   Patient: [name] FMW:[id number] DOB: 1951-12-07 DOA: 12/30/2023     1 DOS: the patient was seen and examined on 12/31/2023   Brief hospital admission course: As per H&P written by Dr. Harlow on 12/30/2023 Mr. Breaker, a blind 73 y/o with AIDS, COPD hypercalcemia and parathyroid  adenoma, glaucoma had abdominal pain and nausea since Saturday, 11/2823. Today he had episode(s) of emesis and cough. The staff at the long-term care facility sent him to [facility] for possible aspiration and for evaluation of abdominal pain.     ED Course: T 97.4  110/88  HR 115  RR 21. Underweight, chronically ill appearing man who is actively vomiting. NG not placed at time of exam. Lab: Glucose 138, Cr. !.65 (1.22 baseline), Calcium  13.7 (chronic). Lactic acid 4.0. WBC 4.8, Hgb 14.6, CXR-NAD, emphysema. CT abd/pelvis - SBO with transition point RUQ, gastric distention, fecal impaction.Numerous non-obstructing renal calculi, large calculus in bladder, non-obstructing gallstones. EDP ordered NG to be placed (pending) and did consult GS - Dr. Mavis. TR[facility] called to admit for medical management.  Assessment and Plan: *Partial SBO (small bowel obstruction) versus ileus (HCC) -Continue conservative management -Follow electrolytes and replete as needed -Follow GI service recommendations for NG tube removal and diet advancement. -Initiation of bowel regimen and enemas if patient in agreement as per general surgery recommendations. -No acute surgical intervention currently required.  Other specified chronic obstructive pulmonary disease (HCC) -No acute exacerbation appreciated -Good saturation on room air seen on exam -Continue as needed bronchodilators.  Glaucoma -Continue outpatient eyedrops regimen.  Hypercalcemia -Known to have parathyroid  adenoma -Patient chronic hypercalcemia exacerbated by dehydration -Improved after fluid resuscitation provided -Continue to follow electrolytes  trend.  AIDS (acquired immune deficiency syndrome) (HCC) -Resume home medications when able to take p.o.'s -Continue patient follow-up with ID service.   Subjective:  Chronically ill in appearance; in no acute distress.  Currently expressing no abdominal pain, no nausea, no vomiting.  NG tube intermittent suction in place.  Physical Exam: Vitals:   12/30/23 2106 12/31/23 0119 12/31/23 0445 12/31/23 1503  BP: 111/84 130/88 (!) 108/59 110/75  Pulse: (!) 108 (!) 106 96 94  Resp: 18 19 18 18   Temp: (!) 97.4 F (36.3 C) 98 F (36.7 C) 97.9 F (36.6 C) 97.9 F (36.6 C)  TempSrc: Oral Axillary Axillary Oral  SpO2: 100%  100% 100%   General exam: Alert, awake, oriented x 3; NG tube in place.  Chronically ill and underweight in appearance. Respiratory system: Clear to auscultation. Respiratory effort normal.  Good saturation on room air. Cardiovascular system: Mild sinus tachycardia, no, gallops, no JVD. Gastrointestinal system: Abdomen is nondistended, soft and nontender. No organomegaly or masses felt. Normal bowel sounds heard. Central nervous system: Moving 4 limbs spontaneously.  No focal neurological deficits. Extremities: No cyanosis, clubbing or edema. Skin: No petechiae. Psychiatry: Flat affect appreciated on exam.  Data Reviewed: Urinalysis: Specific gravity 1.43, negative nitrite, positive leukocyte esterase (21-50 WBCs).  Cloudy appearance. CBC: WBCs 11.4, hemoglobin 13.0 and platelet count 1 17K Basic metabolic panel: Sodium 144, potassium 3.5, chloride 112, bicarb 23, BUN 26, creatinine 1.34 and GFR 56; calcium  11.9. Urine culture: Pending.  Family Communication: No family at bedside.  Disposition: Status is: Inpatient Remains inpatient appropriate because: Continue IV therapy and further management for SBO.   Planned Discharge Destination: Skilled nursing facility   Time spent: 50 minutes  Author: Eric Nunnery, MD 12/31/2023 5:07 PM  For on call review  [email or web address].

## 2023-12-31 NOTE — Progress Notes (Signed)
 NG tube placed @234 , contacted radiology for placement verification. During hourly round pt NG tube was laying on his chest. Pt states he mistakenly pulled ng tube out. Asked staff to wait about an hour before attempting to do this again. Notified Hospitalist O. Adefeso.

## 2024-01-01 DIAGNOSIS — B20 Human immunodeficiency virus [HIV] disease: Secondary | ICD-10-CM | POA: Diagnosis not present

## 2024-01-01 DIAGNOSIS — J4489 Other specified chronic obstructive pulmonary disease: Secondary | ICD-10-CM | POA: Diagnosis not present

## 2024-01-01 DIAGNOSIS — K56609 Unspecified intestinal obstruction, unspecified as to partial versus complete obstruction: Secondary | ICD-10-CM | POA: Diagnosis not present

## 2024-01-01 DIAGNOSIS — H409 Unspecified glaucoma: Secondary | ICD-10-CM | POA: Diagnosis not present

## 2024-01-01 LAB — CBC
HCT: 34.6 % — ABNORMAL LOW (ref 39.0–52.0)
Hemoglobin: 11.1 g/dL — ABNORMAL LOW (ref 13.0–17.0)
MCH: 31.3 pg (ref 26.0–34.0)
MCHC: 32.1 g/dL (ref 30.0–36.0)
MCV: 97.5 fL (ref 80.0–100.0)
Platelets: 131 10*3/uL — ABNORMAL LOW (ref 150–400)
RBC: 3.55 MIL/uL — ABNORMAL LOW (ref 4.22–5.81)
RDW: 14.8 % (ref 11.5–15.5)
WBC: 9 10*3/uL (ref 4.0–10.5)
nRBC: 0 % (ref 0.0–0.2)

## 2024-01-01 LAB — BASIC METABOLIC PANEL
Anion gap: 6 (ref 5–15)
BUN: 19 mg/dL (ref 8–23)
CO2: 25 mmol/L (ref 22–32)
Calcium: 11.7 mg/dL — ABNORMAL HIGH (ref 8.9–10.3)
Chloride: 114 mmol/L — ABNORMAL HIGH (ref 98–111)
Creatinine, Ser: 1.42 mg/dL — ABNORMAL HIGH (ref 0.61–1.24)
GFR, Estimated: 52 mL/min — ABNORMAL LOW (ref >60)
Glucose, Bld: 78 mg/dL (ref 70–99)
Potassium: 3.5 mmol/L (ref 3.5–5.1)
Sodium: 145 mmol/L (ref 135–145)

## 2024-01-01 LAB — URINE CULTURE

## 2024-01-01 MED ORDER — DARUN-COBIC-EMTRICIT-TENOFAF 800-150-200-10 MG PO TABS
1.0000 | ORAL_TABLET | Freq: Every day | ORAL | Status: DC
Start: 2024-01-01 — End: 2024-01-02
  Administered 2024-01-01 – 2024-01-02 (×2): 1 via ORAL
  Filled 2024-01-01 (×5): qty 1

## 2024-01-01 MED ORDER — ENSURE ENLIVE PO LIQD
237.0000 mL | Freq: Three times a day (TID) | ORAL | Status: DC
  Administered 2024-01-01 – 2024-01-02 (×5): 237 mL via ORAL

## 2024-01-01 MED ORDER — LATANOPROST 0.005 % OP SOLN
1.0000 [drp] | Freq: Every day | OPHTHALMIC | Status: DC
  Administered 2024-01-01: 1 [drp] via OPHTHALMIC
  Filled 2024-01-01: qty 2.5

## 2024-01-01 MED ORDER — TAMSULOSIN HCL 0.4 MG PO CAPS
0.4000 mg | ORAL_CAPSULE | Freq: Every day | ORAL | Status: DC
  Administered 2024-01-01 – 2024-01-02 (×2): 0.4 mg via ORAL
  Filled 2024-01-01 (×2): qty 1

## 2024-01-01 MED ORDER — VALACYCLOVIR HCL 500 MG PO TABS
500.0000 mg | ORAL_TABLET | Freq: Every day | ORAL | Status: DC
Start: 2024-01-01 — End: 2024-01-02
  Administered 2024-01-01 – 2024-01-02 (×2): 500 mg via ORAL
  Filled 2024-01-01 (×2): qty 1

## 2024-01-01 MED ORDER — ROSUVASTATIN CALCIUM 10 MG PO TABS
10.0000 mg | ORAL_TABLET | Freq: Every day | ORAL | Status: DC
  Administered 2024-01-01 – 2024-01-02 (×2): 10 mg via ORAL
  Filled 2024-01-01 (×2): qty 1

## 2024-01-01 NOTE — Plan of Care (Signed)
  Problem: Activity: Goal: Risk for activity intolerance will decrease Outcome: Progressing   Problem: Coping: Goal: Level of anxiety will decrease Outcome: Progressing   Problem: Skin Integrity: Goal: Risk for impaired skin integrity will decrease Outcome: Progressing   

## 2024-01-01 NOTE — Progress Notes (Signed)
  Progress Note   Patient: [name] FMW:[id number] DOB: Apr 30, 1951 DOA: 12/30/2023     2 DOS: the patient was seen and examined on 01/01/2024   Brief hospital admission course: As per H&P written by Dr. Harlow on 12/30/2023 Mr. 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 Today he had episode(s) of emesis and cough. The staff at the long-term care facility sent him to [facility] for possible aspiration and for evaluation of abdominal pain.     ED Course: T 97.4  110/88  HR 115  RR 21. Underweight, chronically ill appearing man who is actively vomiting. NG not placed at time of exam. Lab: Glucose 138, Cr. !.65 (1.22 baseline), Calcium  13.7 (chronic). Lactic acid 4.0. WBC 4.8, Hgb 14.6, CXR-NAD, emphysema. CT abd/pelvis - SBO with transition point RUQ, gastric distention, fecal impaction.Numerous non-obstructing renal calculi, large calculus in bladder, non-obstructing gallstones. EDP ordered NG to be placed (pending) and did consult GS - Dr. Mavis. TRH called to admit for medical management.  Assessment and Plan: *Partial SBO (small bowel obstruction) versus ileus (HCC) -Continue conservative management -Follow electrolytes and replete as needed -Following general surgery service recommendations will remove NG tube and advance diet. -Continue to maintain adequate bowel regimen. -No acute surgical intervention currently required.  Other specified chronic obstructive pulmonary disease (HCC) -No acute exacerbation appreciated -Good saturation on room air seen on exam -Continue as needed bronchodilators.  Glaucoma -Continue outpatient eyedrops regimen.  Hypercalcemia -Known to have parathyroid  adenoma -Patient chronic hypercalcemia exacerbated by dehydration -Improved after fluid resuscitation provided; patient advised to maintain adequate hydration. -Continue to follow electrolytes  trend.  AIDS (acquired immune deficiency syndrome) (HCC) -Will resume home antiretroviral medications. -Continue patient follow-up with ID service.   Subjective:  Chronically ill and underweight in appearance; no chest pain, no shortness of breath, no nausea, no vomiting.  Patient reports no abdominal pain and is currently afebrile.  Explains passing some bowel movement.  Physical Exam: Vitals:   12/31/23 0445 12/31/23 1503 12/31/23 1948 01/01/24 0300  BP: (!) 108/59 110/75 105/71 113/68  Pulse: 96 94 94 82  Resp: 18 18 18 18   Temp: 97.9 F (36.6 C) 97.9 F (36.6 C) 98.2 F (36.8 C) 98.8 F (37.1 C)  TempSrc: Axillary Oral Axillary Oral  SpO2: 100% 100% 100% 99%   General exam: Alert, awake, oriented x 3; no nausea, no vomiting, no abdominal pain. Respiratory system: Clear to auscultation. Respiratory effort normal.  Good saturation on room air. Cardiovascular system:RRR. No rubs or gallops. Gastrointestinal system: Abdomen is nondistended, soft and nontender. No organomegaly or masses felt. Normal bowel sounds heard. Central nervous system: No focal neurological deficits. Extremities: No cyanosis, clubbing or edema. Skin: No petechiae. Psychiatry: Judgement and insight appear normal.  Flat affect appreciated on exam.  Data Reviewed: Urinalysis: Specific gravity 1.43, negative nitrite, positive leukocyte esterase (21-50 WBCs).  Cloudy appearance. CBC: WBCs 9.0, hemoglobin 11.1 and platelet counts 131K Basic metabolic panel: Sodium 145, potassium 3.5, chloride 114, bicarb 25, BUN 19, creatinine 1.4 and GFR 52. Urine culture: Pending.  Family Communication: No family at bedside.  Disposition: Status is: Inpatient Remains inpatient appropriate because: Continue IV therapy and further management for SBO.  Planned Discharge Destination: Skilled nursing facility  Time spent: 50 minutes  Author: Eric Nunnery, MD 01/01/2024 9:49 AM  For on call review [email or web address].

## 2024-01-01 NOTE — Progress Notes (Signed)
 Subjective: Patient has no particular complaints.  Did have multiple bowel movements over the past 24 hours.  Objective: Vital signs in last 24 hours: Temp:  [97.9 F (36.6 C)-98.8 F (37.1 C)] 98.8 F (37.1 C) (01/02 0300) Pulse Rate:  [82-94] 82 (01/02 0300) Resp:  [18] 18 (01/02 0300) BP: (105-113)/(68-75) 113/68 (01/02 0300) SpO2:  [99 %-100 %] 99 % (01/02 0300) Last BM Date : 12/30/23  Intake/Output from previous day: 01/01 0701 - 01/02 0700 In: 139.7 [IV Piggyback:139.7] Out: 850 [Urine:850] Intake/Output this shift: No intake/output data recorded.  General appearance: alert, cooperative, and no distress GI: soft, non-tender; bowel sounds normal; no masses,  no organomegaly  Lab Results:  Recent Labs    12/31/23 0437 01/01/24 0503  WBC 11.4* 9.0  HGB 13.0 11.1*  HCT 40.4 34.6*  PLT 117* 131*   BMET Recent Labs    12/31/23 0437 01/01/24 0503  NA 144 145  K 3.5 3.5  CL 112* 114*  CO2 23 25  GLUCOSE 137* 78  BUN 26* 19  CREATININE 1.34* 1.42*  CALCIUM  11.9* 11.7*   PT/INR No results for input(s): LABPROT, INR in the last 72 hours.  Studies/Results: DG Abd 1 View Result Date: 12/31/2023 CLINICAL DATA:  Status post NG tube placement. EXAM: ABDOMEN - 1 VIEW COMPARISON:  Earlier same day FINDINGS: NG tube is in the stomach with proximal side port below the GE junction. Diffuse gaseous distention of small bowel and colon again noted. IMPRESSION: NG tube is in the stomach. Electronically Signed   By: Camellia Candle M.D.   On: 12/31/2023 08:22   Abd 1 View (KUB) Result Date: 12/31/2023 CLINICAL DATA:  NG tube placement. EXAM: ABDOMEN - 1 VIEW COMPARISON:  CT yesterday FINDINGS: Tip and side port of the enteric tube below the diaphragm in the stomach. Decreased gaseous gastric distension. Dilated bowel loops in the upper abdomen persist. Elevated left hemidiaphragm as before. IMPRESSION: Tip and side port of the enteric tube below the diaphragm in the stomach.  Decreased gaseous gastric distension. Electronically Signed   By: Andrea Gasman M.D.   On: 12/31/2023 03:54   CT ABDOMEN PELVIS W CONTRAST Result Date: 12/30/2023 CLINICAL DATA:  Nausea and vomiting, possible aspiration, concern for bowel obstruction EXAM: CT ABDOMEN AND PELVIS WITH CONTRAST TECHNIQUE: Multidetector CT imaging of the abdomen and pelvis was performed using the standard protocol following bolus administration of intravenous contrast. RADIATION DOSE REDUCTION: This exam was performed according to the departmental dose-optimization program which includes automated exposure control, adjustment of the mA and/or kV according to patient size and/or use of iterative reconstruction technique. CONTRAST:  75mL OMNIPAQUE  IOHEXOL  300 MG/ML  SOLN COMPARISON:  08/03/2022 FINDINGS: Lower chest: Persistent area of subpleural rounded consolidation within the left lower lobe posterior costophrenic angle, consistent with rounded atelectasis or scarring. In addition, there is new ground-glass airspace disease within the more superior dependent left lower lobe, which could reflect aspiration given clinical presentation. Hepatobiliary: Small calcified gallstones without cholecystitis. The liver is unremarkable. No biliary duct dilation. Pancreas: Unremarkable. No pancreatic ductal dilatation or surrounding inflammatory changes. Spleen: Normal in size without focal abnormality. Adrenals/Urinary Tract: Simple appearing bilateral renal cortical cysts are again noted, which do not require specific imaging follow-up. There are too numerous to count bilateral nonobstructing renal calculi, which are new since prior CT. Largest calculus on the right measures up to 4 mm. Largest calculus on the left within the renal pelvis measures up to 12 mm. No hydronephrosis or hydroureter.  A large bladder calculus is again identified, measuring up to 3.8 cm in size. Bladder is otherwise unremarkable. The adrenals are normal.  Stomach/Bowel: There is a large amount of retained stool within the rectal vault and rectosigmoid colon, consistent with fecal impaction. Associated rectal wall thickening compatible with an element of stercoral colitis. There is also evidence of small bowel obstruction, with numerous distended loops of small bowel throughout the abdomen and pelvis, measuring up to 3.8 cm in size. There are several loops of decompressed small bowel within the right upper abdomen image 30/2 and right mid abdomen image 39/2, which could be the transition points. Marked gastric distension is also noted, and decompression with enteric catheter should be considered in this patient with clinical and imaging features concerning for aspiration. Vascular/Lymphatic: Aortic atherosclerosis. No enlarged abdominal or pelvic lymph nodes. Reproductive: Prostate is unremarkable. Other: No free fluid or free intraperitoneal gas. No abdominal wall hernia. Musculoskeletal: No acute or destructive bony abnormalities. Reconstructed images demonstrate no additional findings. IMPRESSION: 1. Small-bowel obstruction, with transition point likely within the right upper or right mid abdomen where several loops of decompressed small bowel are noted. 2. Marked gastric distension. Decompression with enteric catheter should be considered in this patient with clinical and imaging findings concerning for aspiration. 3. Chronic left lower lobe scarring or atelectasis, with new ground-glass airspace disease concerning for aspiration. 4. Large amount of retained stool within the rectum and distal rectosigmoid colon, consistent with fecal impaction. Rectal wall thickening compatible with stercoral colitis. 5. Numerous bilateral nonobstructing renal calculi, new since prior study. Large bladder calculus, increased in size since prior exam. 6. Cholelithiasis without cholecystitis. 7.  Aortic Atherosclerosis (ICD10-I70.0). Electronically Signed   By: Ozell Daring M.D.    On: 12/30/2023 15:21   DG Chest Portable 1 View Result Date: 12/30/2023 CLINICAL DATA:  Nausea and vomiting, aspiration EXAM: PORTABLE CHEST 1 VIEW COMPARISON:  09/06/2023 FINDINGS: Single frontal view of the chest demonstrates chronic elevation of the left hemidiaphragm. The cardiac silhouette is unremarkable. Chronic scarring at the left base. No acute airspace disease, effusion, or pneumothorax. Bullous emphysematous changes are again seen at the apices. No acute bony abnormality. IMPRESSION: 1. No acute intrathoracic process. 2. Chronic elevation of the left hemidiaphragm with stable left lower lobe scarring or atelectasis. 3. Stable emphysema. Electronically Signed   By: Ozell Daring M.D.   On: 12/30/2023 15:04    Anti-infectives: Anti-infectives (From admission, onward)    Start     Dose/Rate Route Frequency Ordered Stop   12/31/23 1600  vancomycin  (VANCOREADY) IVPB 750 mg/150 mL  Status:  Discontinued        750 mg 150 mL/hr over 60 Minutes Intravenous Every 24 hours 12/30/23 1426 12/30/23 1829   12/31/23 1000  Darunavir -Cobicistat-Emtricitabine -Tenofovir  Alafenamide (SYMTUZA ) 800-150-200-10 MG TABS 1 tablet  Status:  Discontinued        1 tablet Oral Daily 12/30/23 1759 12/31/23 0759   12/30/23 1500  piperacillin -tazobactam (ZOSYN ) IVPB 3.375 g        3.375 g 12.5 mL/hr over 240 Minutes Intravenous Every 8 hours 12/30/23 1426     12/30/23 1430  vancomycin  (VANCOCIN ) IVPB 1000 mg/200 mL premix        1,000 mg 200 mL/hr over 60 Minutes Intravenous  Once 12/30/23 1426 12/30/23 1900       Assessment/Plan: Impression: Partial small bowel obstruction, obstipation.  Resolving. Plan: Will remove NG tube and start full liquid diet.  May advance as tolerated.  No need for acute  surgical intervention at the present time.  LOS: 2 days    Oneil Budge 01/01/2024

## 2024-01-01 NOTE — TOC Progression Note (Signed)
 Transition of Care [facility]) - Progression Note    Patient Details  Name: [name] MRN: [id number] Date of Birth: 06/30/1951  [facility]) CM/SW Contact  Lucie Lunger, [place] Phone Number: 01/01/2024, 11:49 AM  Clinical Narrative:    CSW spoke to Debbie at [facility] who requests PT eval be ordered and that insurance auth be started prior to pts return to facility. TOC to follow.  Expected Discharge Plan: Long Term Nursing Home Barriers to Discharge: Continued Medical Work up  Expected Discharge Plan and Services In-house Referral: Clinical Social Work   Post Acute Care Choice: Resumption of Svcs/PTA Provider Living arrangements for the past 2 months: Skilled Nursing Facility                                       Social Determinants of Health (SDOH) Interventions SDOH Screenings   Food Insecurity: No Food Insecurity (12/30/2023)  Housing: Low Risk  (12/30/2023)  Transportation Needs: No Transportation Needs (12/30/2023)  Utilities: Not At Risk (12/30/2023)  Depression (PHQ2-9): Low Risk  (07/07/2023)  Social Connections: Moderately Isolated (12/30/2023)  Tobacco Use: High Risk (12/30/2023)    Readmission Risk Interventions     No data to display

## 2024-01-01 NOTE — Evaluation (Signed)
 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 Today he had episode(s) of emesis and cough. The staff at the long-term care facility sent him to [facility] for possible aspiration and for evaluation of abdominal pain.   Clinical Impression  Patient very unsteady on feet with poor return for advancing LLE due to weakness, scissoring of left leg and had to mostly shuffle LLE during transfer to chair.  Patient tolerated sitting up in chair after therapy - nursing staff notified. Patient will benefit from continued skilled physical therapy in hospital and recommended venue below to increase strength, balance, endurance for safe ADLs and gait.        If plan is discharge home, recommend the following: A lot of help with bathing/dressing/bathroom;A lot of help with walking and/or transfers;Help with stairs or ramp for entrance;Assistance with cooking/housework   Can travel by private vehicle   No    Equipment Recommendations None recommended by PT  Recommendations for Other Services       Functional Status Assessment Patient has had a recent decline in their functional status and demonstrates the ability to make significant improvements in function in a reasonable and predictable amount of time.     Precautions / Restrictions Precautions Precautions: Fall Restrictions Weight Bearing Restrictions Per Provider Order: No      Mobility  Bed Mobility Overal bed mobility: Needs Assistance Bed Mobility: Supine to Sit     Supine to sit: Mod assist     General bed mobility comments: increased time, labored movement    Transfers Overall transfer level: Needs assistance Equipment used: Rolling walker (2 wheels) Transfers: Sit to/from  Stand, Bed to chair/wheelchair/BSC Sit to Stand: Mod assist   Step pivot transfers: Mod assist       General transfer comment: unsteady labored movment with difficulty moving LLE due to weakness    Ambulation/Gait Ambulation/Gait assistance: Max assist, Mod assist Gait Distance (Feet): 4 Feet Assistive device: Rolling walker (2 wheels) Gait Pattern/deviations: Decreased step length - left, Decreased step length - right, Decreased stance time - left, Scissoring, Shuffle Gait velocity: slow     General Gait Details: limited to a few slow labored side steps scissoring of legs and mostly draging of left foot due to weakness  Stairs            Wheelchair Mobility     Tilt Bed    Modified Rankin (Stroke Patients Only)       Balance Overall balance assessment: Needs assistance Sitting-balance support: Feet supported, No upper extremity supported Sitting balance-Leahy Scale: Fair Sitting balance - Comments: seated at EOB   Standing balance support: Reliant on assistive device for balance, During functional activity, Bilateral upper extremity supported Standing balance-Leahy Scale: Poor Standing balance comment: using RW                             Pertinent Vitals/Pain Pain Assessment Pain Assessment: Faces Faces Pain Scale: Hurts little more Pain Location: stomach Pain Descriptors / Indicators: Aching Pain Intervention(s): Limited activity within patient's tolerance, Monitored during session, Repositioned    Home Living Family/patient expects to be discharged to:: Skilled nursing facility  Prior Function Prior Level of Function : Needs assist       Physical Assist : Mobility (physical);ADLs (physical) Mobility (physical): Bed mobility;Transfers;Gait;Stairs   Mobility Comments: uses wheelchair mostly, assisted transfers and very short household distances using RW ADLs Comments: Assisted by SNF staff      Extremity/Trunk Assessment   Upper Extremity Assessment Upper Extremity Assessment: Generalized weakness    Lower Extremity Assessment Lower Extremity Assessment: Generalized weakness    Cervical / Trunk Assessment Cervical / Trunk Assessment: Kyphotic  Communication   Communication Communication: No apparent difficulties  Cognition Arousal: Alert Behavior During Therapy: WFL for tasks assessed/performed Overall Cognitive Status: Within Functional Limits for tasks assessed                                          General Comments      Exercises     Assessment/Plan    PT Assessment Patient needs continued PT services  PT Problem List Decreased strength;Decreased activity tolerance;Decreased balance;Decreased mobility       PT Treatment Interventions DME instruction;Gait training;Functional mobility training;Therapeutic activities;Therapeutic exercise;Balance training;Wheelchair mobility training;Patient/family education    PT Goals (Current goals can be found in the Care Plan section)  Acute Rehab PT Goals Patient Stated Goal: return home PT Goal Formulation: With patient Time For Goal Achievement: 01/15/24 Potential to Achieve Goals: Good    Frequency Min 3X/week     Co-evaluation               AM-PAC PT 6 Clicks Mobility  Outcome Measure Help needed turning from your back to your side while in a flat bed without using bedrails?: A Lot Help needed moving from lying on your back to sitting on the side of a flat bed without using bedrails?: A Lot Help needed moving to and from a bed to a chair (including a wheelchair)?: A Lot Help needed standing up from a chair using your arms (e.g., wheelchair or bedside chair)?: A Lot Help needed to walk in hospital room?: A Lot Help needed climbing 3-5 steps with a railing? : Total 6 Click Score: 11    End of Session   Activity Tolerance: Patient tolerated treatment well;Patient limited by  fatigue Patient left: in chair;with call bell/phone within reach Nurse Communication: Mobility status PT Visit Diagnosis: Unsteadiness on feet (R26.81);Other abnormalities of gait and mobility (R26.89);Muscle weakness (generalized) (M62.81)    Time: 8494-8474 PT Time Calculation (min) (ACUTE ONLY): 20 min   Charges:   PT Evaluation $PT Eval Moderate Complexity: 1 Mod PT Treatments $Therapeutic Activity: 8-22 mins PT General Charges $$ ACUTE PT VISIT: 1 Visit         3:52 PM, 01/01/24 Lynwood Music, MPT Physical Therapist with [facility] [phone] office [phone] mobile phone

## 2024-01-01 NOTE — Plan of Care (Signed)
  Problem: Acute Rehab PT Goals(only PT should resolve) Goal: Pt Will Go Supine/Side To Sit Outcome: Progressing Flowsheets (Taken 01/01/2024 1553) Pt will go Supine/Side to Sit:  with minimal assist  with contact guard assist Goal: Patient Will Transfer Sit To/From Stand Outcome: Progressing Flowsheets (Taken 01/01/2024 1553) Patient will transfer sit to/from stand: with minimal assist Goal: Pt Will Transfer Bed To Chair/Chair To Bed Outcome: Progressing Flowsheets (Taken 01/01/2024 1553) Pt will Transfer Bed to Chair/Chair to Bed:  with min assist  with mod assist Goal: Pt Will Ambulate Outcome: Progressing Flowsheets (Taken 01/01/2024 1553) Pt will Ambulate:  10 feet  with moderate assist  with rolling walker   3:54 PM, 01/01/24 Lynwood Music, MPT Physical Therapist with [facility] [phone] office [phone] mobile phone

## 2024-01-02 DIAGNOSIS — K56609 Unspecified intestinal obstruction, unspecified as to partial versus complete obstruction: Secondary | ICD-10-CM | POA: Diagnosis not present

## 2024-01-02 LAB — BASIC METABOLIC PANEL
Anion gap: 4 — ABNORMAL LOW (ref 5–15)
BUN: 17 mg/dL (ref 8–23)
CO2: 24 mmol/L (ref 22–32)
Calcium: 11.8 mg/dL — ABNORMAL HIGH (ref 8.9–10.3)
Chloride: 110 mmol/L (ref 98–111)
Creatinine, Ser: 1.27 mg/dL — ABNORMAL HIGH (ref 0.61–1.24)
GFR, Estimated: 60 mL/min — ABNORMAL LOW (ref >60)
Glucose, Bld: 91 mg/dL (ref 70–99)
Potassium: 3.5 mmol/L (ref 3.5–5.1)
Sodium: 138 mmol/L (ref 135–145)

## 2024-01-02 MED ORDER — DICLOFENAC SODIUM 1 % EX GEL: 2.0000 g | Freq: Four times a day (QID) | CUTANEOUS | 0 refills | Status: DC

## 2024-01-02 NOTE — Discharge Summary (Signed)
 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 The staff at the long-term care facility sent him to [facility] for possible aspiration and for evaluation of abdominal pain.  He was admitted with partial SBO and underwent conservative management with general surgery following.  He initially had NG tube which was removed shortly after he was placed and diet was gradually advanced.  He is noted to have chronic persistent hypercalcemia which was stabilized with hydration and will need to be monitored carefully in the outpatient setting.  No other acute events or concerns noted and he is in stable condition for discharge.  Discharge Diagnoses:  Principal Problem:   SBO (small bowel obstruction) (HCC) Active Problems:   Other specified chronic obstructive pulmonary disease (HCC)   AIDS (acquired immune deficiency syndrome) (HCC)   Hypercalcemia   Glaucoma  Principal discharge diagnosis: Partial SBO.  Discharge Instructions  Discharge Instructions     Diet - low sodium heart healthy   Complete by: As directed    Increase activity slowly   Complete by: As directed       Allergies as of 01/02/2024   No Known Allergies      Medication List     TAKE these medications    acetaminophen  500 MG tablet Commonly known as: TYLENOL  Take 500 mg by mouth in the  morning, at noon, and at bedtime. Left should and left upper arm pain   Cholecalciferol 25 MCG (1000 UT) capsule Take 1,000 Units by mouth daily.   diclofenac  Sodium 1 % Gel Commonly known as: VOLTAREN  Apply 2 g topically 4 (four) times daily. To left shoulder for shoulder pain   NUTRITIONAL SUPPLEMENT PO Take 1 Bar by mouth daily. Frozen nutritional supplement   feeding supplement Liqd Take 237 mLs by mouth 3 (three) times daily between meals.   hyoscyamine 0.125 MG Tbdp disintergrating tablet Commonly known as: ANASPAZ Place 0.125 mg under the tongue every 4 (four) hours as needed (dysuria, bladder dysfunction).   latanoprost  0.005 % ophthalmic solution Commonly known as: XALATAN  Place 1 drop into both eyes at bedtime.   loperamide 2 MG tablet Commonly known as: IMODIUM A-D Take 2 mg by mouth 4 (four) times daily as needed for diarrhea or loose stools.   ondansetron  4 MG tablet Commonly known as: ZOFRAN  Take 1 tablet (4 mg total) by mouth every 6 (six) hours as needed for nausea.   rosuvastatin  10 MG tablet Commonly known as: Crestor  Take 1 tablet (10 mg total) by mouth daily.   Symtuza  800-150-200-10 MG Tabs Generic drug: Darunavir -Cobicistat-Emtricitabine -Tenofovir  Alafenamide Take 1 tablet by mouth daily.   tamsulosin  0.4 MG Caps capsule Commonly known as: FLOMAX  Take 0.4 mg by mouth daily.   valACYclovir  500 MG tablet Commonly known as: VALTREX  Take 500 mg by mouth daily.        No Known Allergies  Consultations: General surgery   Procedures/Studies: DG Abd 1 View Result Date: 12/31/2023 CLINICAL  DATA:  Status post NG tube placement. EXAM: ABDOMEN - 1 VIEW COMPARISON:  Earlier same day FINDINGS: NG tube is in the stomach with proximal side port below the GE junction. Diffuse gaseous distention of small bowel and colon again noted. IMPRESSION: NG tube is in the stomach. Electronically Signed   By: Camellia Candle M.D.   On: 12/31/2023 08:22   Abd 1 View  (KUB) Result Date: 12/31/2023 CLINICAL DATA:  NG tube placement. EXAM: ABDOMEN - 1 VIEW COMPARISON:  CT yesterday FINDINGS: Tip and side port of the enteric tube below the diaphragm in the stomach. Decreased gaseous gastric distension. Dilated bowel loops in the upper abdomen persist. Elevated left hemidiaphragm as before. IMPRESSION: Tip and side port of the enteric tube below the diaphragm in the stomach. Decreased gaseous gastric distension. Electronically Signed   By: Andrea Gasman M.D.   On: 12/31/2023 03:54   CT ABDOMEN PELVIS W CONTRAST Result Date: 12/30/2023 CLINICAL DATA:  Nausea and vomiting, possible aspiration, concern for bowel obstruction EXAM: CT ABDOMEN AND PELVIS WITH CONTRAST TECHNIQUE: Multidetector CT imaging of the abdomen and pelvis was performed using the standard protocol following bolus administration of intravenous contrast. RADIATION DOSE REDUCTION: This exam was performed according to the departmental dose-optimization program which includes automated exposure control, adjustment of the mA and/or kV according to patient size and/or use of iterative reconstruction technique. CONTRAST:  75mL OMNIPAQUE  IOHEXOL  300 MG/ML  SOLN COMPARISON:  08/03/2022 FINDINGS: Lower chest: Persistent area of subpleural rounded consolidation within the left lower lobe posterior costophrenic angle, consistent with rounded atelectasis or scarring. In addition, there is new ground-glass airspace disease within the more superior dependent left lower lobe, which could reflect aspiration given clinical presentation. Hepatobiliary: Small calcified gallstones without cholecystitis. The liver is unremarkable. No biliary duct dilation. Pancreas: Unremarkable. No pancreatic ductal dilatation or surrounding inflammatory changes. Spleen: Normal in size without focal abnormality. Adrenals/Urinary Tract: Simple appearing bilateral renal cortical cysts are again noted, which do not require specific imaging follow-up.  There are too numerous to count bilateral nonobstructing renal calculi, which are new since prior CT. Largest calculus on the right measures up to 4 mm. Largest calculus on the left within the renal pelvis measures up to 12 mm. No hydronephrosis or hydroureter. A large bladder calculus is again identified, measuring up to 3.8 cm in size. Bladder is otherwise unremarkable. The adrenals are normal. Stomach/Bowel: There is a large amount of retained stool within the rectal vault and rectosigmoid colon, consistent with fecal impaction. Associated rectal wall thickening compatible with an element of stercoral colitis. There is also evidence of small bowel obstruction, with numerous distended loops of small bowel throughout the abdomen and pelvis, measuring up to 3.8 cm in size. There are several loops of decompressed small bowel within the right upper abdomen image 30/2 and right mid abdomen image 39/2, which could be the transition points. Marked gastric distension is also noted, and decompression with enteric catheter should be considered in this patient with clinical and imaging features concerning for aspiration. Vascular/Lymphatic: Aortic atherosclerosis. No enlarged abdominal or pelvic lymph nodes. Reproductive: Prostate is unremarkable. Other: No free fluid or free intraperitoneal gas. No abdominal wall hernia. Musculoskeletal: No acute or destructive bony abnormalities. Reconstructed images demonstrate no additional findings. IMPRESSION: 1. Small-bowel obstruction, with transition point likely within the right upper or right mid abdomen where several loops of decompressed small bowel are noted. 2. Marked gastric distension. Decompression with enteric catheter should be considered in this patient with  clinical and imaging findings concerning for aspiration. 3. Chronic left lower lobe scarring or atelectasis, with new ground-glass airspace disease concerning for aspiration. 4. Large amount of retained stool within  the rectum and distal rectosigmoid colon, consistent with fecal impaction. Rectal wall thickening compatible with stercoral colitis. 5. Numerous bilateral nonobstructing renal calculi, new since prior study. Large bladder calculus, increased in size since prior exam. 6. Cholelithiasis without cholecystitis. 7.  Aortic Atherosclerosis (ICD10-I70.0). Electronically Signed   By: Ozell Daring M.D.   On: 12/30/2023 15:21   DG Chest Portable 1 View Result Date: 12/30/2023 CLINICAL DATA:  Nausea and vomiting, aspiration EXAM: PORTABLE CHEST 1 VIEW COMPARISON:  09/06/2023 FINDINGS: Single frontal view of the chest demonstrates chronic elevation of the left hemidiaphragm. The cardiac silhouette is unremarkable. Chronic scarring at the left base. No acute airspace disease, effusion, or pneumothorax. Bullous emphysematous changes are again seen at the apices. No acute bony abnormality. IMPRESSION: 1. No acute intrathoracic process. 2. Chronic elevation of the left hemidiaphragm with stable left lower lobe scarring or atelectasis. 3. Stable emphysema. Electronically Signed   By: Ozell Daring M.D.   On: 12/30/2023 15:04     Discharge Exam: Vitals:   01/02/24 0430 01/02/24 0858  BP: 99/62 100/62  Pulse: 80 71  Resp: 18 12  Temp: 98.4 F (36.9 C) 98.6 F (37 C)  SpO2: 95% 99%   Vitals:   01/01/24 1452 01/01/24 2051 01/02/24 0430 01/02/24 0858  BP: 108/67 94/70 99/62  100/62  Pulse: 83 99 80 71  Resp: 14 16 18 12   Temp: (!) 97.3 F (36.3 C) 98.4 F (36.9 C) 98.4 F (36.9 C) 98.6 F (37 C)  TempSrc: Axillary Oral Oral Oral  SpO2: 99% 100% 95% 99%    General: Pt is alert, awake, not in acute distress Cardiovascular: RRR, S1/S2 +, no rubs, no gallops Respiratory: CTA bilaterally, no wheezing, no rhonchi Abdominal: Soft, NT, ND, bowel sounds + Extremities: no edema, no cyanosis    The results of significant diagnostics from this hospitalization (including imaging, microbiology, ancillary and  laboratory) are listed below for reference.     Microbiology: Recent Results (from the past 240 hours)  Blood culture (routine x 2)     Status: None (Preliminary result)   Collection Time: 12/30/23  1:27 PM   Specimen: BLOOD  Result Value Ref Range Status   Specimen Description BLOOD RW  Final   Special Requests   Final    BOTTLES DRAWN AEROBIC ONLY Blood Culture results may not be optimal due to an inadequate volume of blood received in culture bottles   Culture   Final    NO GROWTH 3 DAYS Performed at [facility], [address], [place], [place] [address]    Report Status PENDING  Incomplete  Blood culture (routine x 2)     Status: None (Preliminary result)   Collection Time: 12/30/23  1:44 PM   Specimen: BLOOD  Result Value Ref Range Status   Specimen Description BLOOD LEFT ARM  Final   Special Requests   Final    BOTTLES DRAWN AEROBIC AND ANAEROBIC Blood Culture adequate volume   Culture   Final    NO GROWTH 3 DAYS Performed at [facility], [address], [place], [place] [address]    Report Status PENDING  Incomplete  MRSA Next Gen by PCR, Nasal     Status: None   Collection Time: 12/30/23  9:20 PM   Specimen: Nasal Mucosa; Nasal Swab  Result Value  Ref Range Status   MRSA by PCR Next Gen NOT DETECTED NOT DETECTED Final    Comment: (NOTE) The GeneXpert MRSA Assay (FDA approved for NASAL specimens only), is one component of a comprehensive MRSA colonization surveillance program. It is not intended to diagnose MRSA infection nor to guide or monitor treatment for MRSA infections. Test performance is not FDA approved in patients less than 43 years old. Performed at [facility], [address], [place], [place] [address]   Urine Culture     Status: None   Collection Time: 12/31/23  1:28 AM   Specimen: Urine, Clean Catch  Result Value Ref Range Status   Specimen Description   Final    URINE, CLEAN CATCH Performed at [facility], [address] [place],  [place] [address]    Special Requests   Final    NONE Reflexed from [id number] Performed at [facility], [address], [place], [place] [address]    Culture   Final    NO GROWTH Performed at [facility], [address] [place], [place] [address]    Report Status 01/01/2024 FINAL  Final     Labs: BNP (last 3 results) No results for input(s): BNP in the last 8760 hours. Basic Metabolic Panel: Recent Labs  Lab 12/30/23 1327 12/31/23 0437 01/01/24 0503 01/02/24 0546  NA 139 144 145 138  K 3.6 3.5 3.5 3.5  CL 106 112* 114* 110  CO2 21* 23 25 24   GLUCOSE 138* 137* 78 91  BUN 29* 26* 19 17  CREATININE 1.65* 1.34* 1.42* 1.27*  CALCIUM  13.7* 11.9* 11.7* 11.8*   Liver Function Tests: Recent Labs  Lab 12/30/23 1327  AST 32  ALT 35  ALKPHOS 64  BILITOT 0.5  PROT 9.4*  ALBUMIN 4.1   Recent Labs  Lab 12/30/23 1327  LIPASE 18   No results for input(s): AMMONIA in the last 168 hours. CBC: Recent Labs  Lab 12/30/23 1327 12/31/23 0437 01/01/24 0503  WBC 13.0* 11.4* 9.0  HGB 14.6 13.0 11.1*  HCT 47.1 40.4 34.6*  MCV 98.1 95.7 97.5  PLT 137* 117* 131*   Cardiac Enzymes: No results for input(s): CKTOTAL, CKMB, CKMBINDEX, TROPONINI in the last 168 hours. BNP: Invalid input(s): POCBNP CBG: No results for input(s): GLUCAP in the last 168 hours. D-Dimer No results for input(s): DDIMER in the last 72 hours. Hgb A1c No results for input(s): HGBA1C in the last 72 hours. Lipid Profile No results for input(s): CHOL, HDL, LDLCALC, TRIG, CHOLHDL, LDLDIRECT in the last 72 hours. Thyroid  function studies No results for input(s): TSH, T4TOTAL, T3FREE, THYROIDAB in the last 72 hours.  Invalid input(s): FREET3 Anemia work up No results for input(s): VITAMINB12, FOLATE, FERRITIN, TIBC, IRON, RETICCTPCT in the last 72 hours. Urinalysis    Component Value Date/Time   COLORURINE YELLOW 12/31/2023 0128   APPEARANCEUR CLOUDY  (A) 12/31/2023 0128   LABSPEC 1.043 (H) 12/31/2023 0128   PHURINE 5.0 12/31/2023 0128   GLUCOSEU NEGATIVE 12/31/2023 0128   GLUCOSEU NEG mg/dL 95/83/7989 8067   HGBUR MODERATE (A) 12/31/2023 0128   BILIRUBINUR NEGATIVE 12/31/2023 0128   KETONESUR NEGATIVE 12/31/2023 0128   PROTEINUR 100 (A) 12/31/2023 0128   UROBILINOGEN 1 02/13/2012 1456   NITRITE NEGATIVE 12/31/2023 0128   LEUKOCYTESUR LARGE (A) 12/31/2023 0128   Sepsis Labs Recent Labs  Lab 12/30/23 1327 12/31/23 0437 01/01/24 0503  WBC 13.0* 11.4* 9.0   Microbiology Recent Results (from the past 240 hours)  Blood culture (routine x 2)     Status: None (Preliminary result)   Collection Time: 12/30/23  1:27 PM   Specimen: BLOOD  Result Value Ref Range Status   Specimen Description BLOOD RW  Final   Special Requests   Final    BOTTLES DRAWN AEROBIC ONLY Blood Culture results may not be optimal due to an inadequate volume of blood received in culture bottles   Culture   Final    NO GROWTH 3 DAYS Performed at [facility], [address], [place], [place] [address]    Report Status PENDING  Incomplete  Blood culture (routine x 2)     Status: None (Preliminary result)   Collection Time: 12/30/23  1:44 PM   Specimen: BLOOD  Result Value Ref Range Status   Specimen Description BLOOD LEFT ARM  Final   Special Requests   Final    BOTTLES DRAWN AEROBIC AND ANAEROBIC Blood Culture adequate volume   Culture   Final    NO GROWTH 3 DAYS Performed at [facility], [address], [place], [place] [address]    Report Status PENDING  Incomplete  MRSA Next Gen by PCR, Nasal     Status: None   Collection Time: 12/30/23  9:20 PM   Specimen: Nasal Mucosa; Nasal Swab  Result Value Ref Range Status   MRSA by PCR Next Gen NOT DETECTED NOT DETECTED Final    Comment: (NOTE) The GeneXpert MRSA Assay (FDA approved for NASAL specimens only), is one component of a comprehensive MRSA colonization surveillance program. It is not intended to  diagnose MRSA infection nor to guide or monitor treatment for MRSA infections. Test performance is not FDA approved in patients less than 47 years old. Performed at [facility], [address], [place], [place] [address]   Urine Culture     Status: None   Collection Time: 12/31/23  1:28 AM   Specimen: Urine, Clean Catch  Result Value Ref Range Status   Specimen Description   Final    URINE, CLEAN CATCH Performed at [facility], [address] [place], [place] [address]    Special Requests   Final    NONE Reflexed from [id number] Performed at [facility], [address] [place], [place] [address]    Culture   Final    NO GROWTH Performed at [facility], [address], [place], [place] [address]    Report Status 01/01/2024 FINAL  Final     Time coordinating discharge: 35 minutes  SIGNED:   Adron JONETTA Fairly, DO [facility] 01/02/2024, 10:25 AM  If 7PM-7AM, please contact night-coverage [email or web address]

## 2024-01-02 NOTE — TOC Transition Note (Signed)
 Transition of Care [facility]) - Discharge Note   Patient Details  Name: [name] MRN: [id number] Date of Birth: January 06, 1951  Transition of Care [facility]) CM/SW Contact:  Lucie Lunger, LCSWA Phone Number: 01/02/2024, 10:55 AM   Clinical Narrative:    CSW updated that pt is medically stable for D/C back to LTC at [facility]. CSW updated [facility] who states they are ready for pts return. CSW left secure VM for pts brother with update. CSW provided RN with room and report numbers. Med necessity completed and printed to the floor for RN. EMS to be called once RN is ready. TOC signing off.   Final next level of care: Long Term Nursing Home Barriers to Discharge: Barriers Resolved   Patient Goals and CMS Choice Patient states their goals for this hospitalization and ongoing recovery are:: return to LTC CMS Medicare.gov Compare Post Acute Care list provided to:: Patient Choice offered to / list presented to : Patient [place] ownership interest in [facility].provided to::  (n/a)    Discharge Placement                Patient to be transferred to facility by: EMS Name of family member notified: Brother Patient and family notified of of transfer: 01/02/24  Discharge Plan and Services Additional resources added to the After Visit Summary for   In-house Referral: Clinical Social Work   Post Acute Care Choice: Resumption of Svcs/PTA Provider                               Social Drivers of Health (SDOH) Interventions SDOH Screenings   Food Insecurity: No Food Insecurity (12/30/2023)  Housing: Low Risk  (12/30/2023)  Transportation Needs: No Transportation Needs (12/30/2023)  Utilities: Not At Risk (12/30/2023)  Depression (PHQ2-9): Low Risk  (07/07/2023)  Social Connections: Moderately Isolated (12/30/2023)  Tobacco Use: High Risk (12/30/2023)     Readmission Risk Interventions     No data to display

## 2024-01-02 NOTE — Plan of Care (Signed)
   Problem: Clinical Measurements: Goal: Ability to maintain clinical measurements within normal limits will improve Outcome: Progressing

## 2024-01-02 NOTE — Progress Notes (Signed)
   01/02/24 1052  AVS Discharge Documentation  AVS Discharge Instructions Including Medications Provided to patient/caregiver  Name of Person Receiving AVS Discharge Instructions Including Medications [name]  Name of Clinician That Reviewed AVS Discharge Instructions Including Medications Alfonse RN   AVS instructions were reviewed with patient, all personal belongings were returned all questions were answered.

## 2024-01-02 NOTE — Progress Notes (Signed)
 Pt tolerated full liquids overnight. Denies pain. He rested well. No acute events overnight. Kellogg RN

## 2024-01-02 NOTE — Care Management Important Message (Signed)
 Important Message  Patient Details  Name: [name] MRN: [id number] Date of Birth: 24-Mar-1951   Important Message Given:  Yes - Medicare IM     Corey Harold 01/02/2024, 11:01 AM

## 2024-01-02 NOTE — Progress Notes (Signed)
 Report called to [place] LPN.

## 2024-01-02 NOTE — Progress Notes (Signed)
  Subjective: Patient has no complaints.  States he is moving his bowels.  Denies any nausea.  Denies any abdominal pain.  Objective: Vital signs in last 24 hours: Temp:  [97.3 F (36.3 C)-98.4 F (36.9 C)] 98.4 F (36.9 C) (01/03 0430) Pulse Rate:  [80-99] 80 (01/03 0430) Resp:  [14-18] 18 (01/03 0430) BP: (94-108)/(62-70) 99/62 (01/03 0430) SpO2:  [95 %-100 %] 95 % (01/03 0430) Last BM Date : 01/01/24  Intake/Output from previous day: 01/02 0701 - 01/03 0700 In: 480 [P.O.:480] Out: 700 [Urine:700] Intake/Output this shift: No intake/output data recorded.  General appearance: alert, cooperative, and no distress GI: soft, non-tender; bowel sounds normal; no masses,  no organomegaly  Lab Results:  Recent Labs    12/31/23 0437 01/01/24 0503  WBC 11.4* 9.0  HGB 13.0 11.1*  HCT 40.4 34.6*  PLT 117* 131*   BMET Recent Labs    01/01/24 0503 01/02/24 0546  NA 145 138  K 3.5 3.5  CL 114* 110  CO2 25 24  GLUCOSE 78 91  BUN 19 17  CREATININE 1.42* 1.27*  CALCIUM  11.7* 11.8*   PT/INR No results for input(s): LABPROT, INR in the last 72 hours.  Studies/Results: No results found.  Anti-infectives: Anti-infectives (From admission, onward)    Start     Dose/Rate Route Frequency Ordered Stop   01/01/24 1200  Darunavir -Cobicistat-Emtricitabine -Tenofovir  Alafenamide (SYMTUZA ) 800-150-200-10 MG TABS 1 tablet        1 tablet Oral Daily with breakfast 01/01/24 0949     01/01/24 1200  valACYclovir  (VALTREX ) tablet 500 mg        500 mg Oral Daily 01/01/24 0949     12/31/23 1600  vancomycin  (VANCOREADY) IVPB 750 mg/150 mL  Status:  Discontinued        750 mg 150 mL/hr over 60 Minutes Intravenous Every 24 hours 12/30/23 1426 12/30/23 1829   12/31/23 1000  Darunavir -Cobicistat-Emtricitabine -Tenofovir  Alafenamide (SYMTUZA ) 800-150-200-10 MG TABS 1 tablet  Status:  Discontinued        1 tablet Oral Daily 12/30/23 1759 12/31/23 0759   12/30/23 1500   piperacillin -tazobactam (ZOSYN ) IVPB 3.375 g        3.375 g 12.5 mL/hr over 240 Minutes Intravenous Every 8 hours 12/30/23 1426     12/30/23 1430  vancomycin  (VANCOCIN ) IVPB 1000 mg/200 mL premix        1,000 mg 200 mL/hr over 60 Minutes Intravenous  Once 12/30/23 1426 12/30/23 1900       Assessment/Plan: Impression: Partial small bowel obstruction with obstipation, resolved Plan: No need for surgical intervention at the present time.  Will advance to heart healthy diet.  Okay for discharge from surgery standpoint.  Will follow peripherally with you.  LOS: 3 days    [name] 01/02/2024

## 2024-01-02 NOTE — [place] FL2 (Signed)
 [place]  MEDICAID FL2 LEVEL OF CARE FORM     IDENTIFICATION  Patient Name: [name] Birthdate: 01/29/1951 Sex: male Admission Date (Current Location): 12/30/2023  [place] and [place] Number:  [place] [id number] Facility and Address:  [facility],  [address], [place] [address]      Provider Number: [id number]  Attending Physician Name and Address:  Maree Adron BIRCH, DO  Relative Name and Phone Number:       Current Level of Care: Hospital Recommended Level of Care: Skilled Nursing Facility Prior Approval Number:    Date Approved/Denied:   PASRR Number:    Discharge Plan: SNF    Current Diagnoses: Patient Active Problem List   Diagnosis Date Noted   SBO (small bowel obstruction) (HCC) 12/30/2023   Glaucoma 12/30/2023   Dysuria 07/07/2023   At increased risk for cardiovascular disease 07/07/2023   Vision changes 09/25/2022   Hypovitaminosis D 08/10/2022   Hypernatremia 08/08/2022   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   Failure to thrive in adult 08/04/2022   Acute metabolic encephalopathy 08/04/2022   Hypercalcemia 08/04/2022   UTI (urinary tract infection) 08/04/2022   Dysarthria 08/04/2022   Urinary incontinence    Fracture, [place], right, closed 01/20/2020   Healthcare maintenance 06/17/2019   Tobacco use 06/17/2019   Infestation by bed bug 11/02/2018   Underweight 11/02/2018   ERECTILE DYSFUNCTION 04/13/2008   Dental caries 08/19/2007   AIDS (acquired immune deficiency syndrome) (HCC) 10/22/2006   Genital herpes 10/22/2006   PNEUMOCYSTIS PNEUMONIA 10/22/2006   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   SEIZURE DISORDER 10/22/2006    Orientation RESPIRATION BLADDER Height & Weight     Self, Place  Normal Continent Weight:   Height:     BEHAVIORAL SYMPTOMS/MOOD NEUROLOGICAL BOWEL NUTRITION STATUS    Convulsions/Seizures Incontinent Diet (See d/c summary)  AMBULATORY STATUS COMMUNICATION OF  NEEDS Skin   Extensive Assist Verbally Other (Comment) (Blister to right great toe)                       Personal Care Assistance Level of Assistance  Bathing, Feeding, Dressing Bathing Assistance: Maximum assistance Feeding assistance: Limited assistance Dressing Assistance: Maximum assistance     Functional Limitations Info  Hearing, Sight, Speech Sight Info: Impaired Hearing Info: Impaired Speech Info: Adequate    SPECIAL CARE FACTORS FREQUENCY                       Contractures      Additional Factors Info  Code Status, Allergies, Isolation Precautions Code Status Info: Full code Allergies Info: Penicillins     Isolation Precautions Info: Enteric precautions     Current Medications (01/02/2024):  This is the current hospital active medication list Current Facility-Administered Medications  Medication Dose Route Frequency Provider Last Rate Last Admin   acetaminophen  (TYLENOL ) tablet 650 mg  650 mg Oral Q6H PRN Norins, Michael E, MD       Or   acetaminophen  (TYLENOL ) suppository 650 mg  650 mg Rectal Q6H PRN Norins, Michael E, MD       bisacodyl  (DULCOLAX) suppository 10 mg  10 mg Rectal Daily PRN Norins, Michael E, MD       Darunavir -Cobicistat-Emtricitabine -Tenofovir  Alafenamide (SYMTUZA ) 800-150-200-10 MG TABS 1 tablet  1 tablet Oral Q breakfast [place], Carlos, MD   1 tablet at 01/02/24 0857   diatrizoate  meglumine -sodium (GASTROGRAFIN ) 66-10 % solution 90 mL  90 mL Per NG tube Once Norins,  Michael E, MD       feeding supplement (ENSURE ENLIVE / ENSURE PLUS) liquid 237 mL  237 mL Oral TID BM Ricky Fines, MD   237 mL at 01/02/24 0857   heparin  injection 5,000 Units  5,000 Units Subcutaneous Q8H Norins, Michael E, MD   5,000 Units at 01/02/24 9473   HYDROmorphone  (DILAUDID ) injection 0.5-1 mg  0.5-1 mg Intravenous Q2H PRN Norins, Michael E, MD       latanoprost  (XALATAN ) 0.005 % ophthalmic solution 1 drop  1 drop Both Eyes QHS Ricky Fines, MD   1 drop  at 01/01/24 2234   ondansetron  (ZOFRAN ) injection 4 mg  4 mg Intravenous Q6H PRN Adefeso, Oladapo, DO   4 mg at 12/31/23 0205   Oral care mouth rinse  15 mL Mouth Rinse PRN Norins, Michael E, MD       piperacillin -tazobactam (ZOSYN ) IVPB 3.375 g  3.375 g Intravenous Q8H Tanda Dempsey SAUNDERS, RPH 12.5 mL/hr at 01/02/24 0528 3.375 g at 01/02/24 9471   rosuvastatin  (CRESTOR ) tablet 10 mg  10 mg Oral Daily Ricky Fines, MD   10 mg at 01/02/24 0856   sodium phosphate (FLEET) enema 1 enema  1 enema Rectal Once Young, Travis J, DO       tamsulosin  (FLOMAX ) capsule 0.4 mg  0.4 mg Oral Daily Ricky Fines, MD   0.4 mg at 01/02/24 9143   valACYclovir  (VALTREX ) tablet 500 mg  500 mg Oral Daily Ricky Fines, MD   500 mg at 01/02/24 9143     Discharge Medications: Please see discharge summary for a list of discharge medications.  Relevant Imaging Results:  Relevant Lab Results:   Additional Information    Lucie Lunger, LCSWA

## 2024-01-04 LAB — CULTURE, BLOOD (ROUTINE X 2)
Culture: NO GROWTH
Culture: NO GROWTH
Special Requests: ADEQUATE

## 2024-01-19 ENCOUNTER — Ambulatory Visit: Payer: 59 | Admitting: Family

## 2024-05-03 ENCOUNTER — Encounter (HOSPITAL_COMMUNITY): Payer: Self-pay | Admitting: Emergency Medicine

## 2024-05-03 ENCOUNTER — Emergency Department (HOSPITAL_COMMUNITY)

## 2024-05-03 ENCOUNTER — Emergency Department (HOSPITAL_COMMUNITY)
Admission: EM | Admit: 2024-05-03 | Discharge: 2024-05-04 | Disposition: A | Discharge status: Skilled Nursing Facility | Attending: Emergency Medicine | Admitting: Emergency Medicine

## 2024-05-03 ENCOUNTER — Other Ambulatory Visit: Payer: Self-pay

## 2024-05-03 DIAGNOSIS — J449 Chronic obstructive pulmonary disease, unspecified: Secondary | ICD-10-CM | POA: Insufficient documentation

## 2024-05-03 DIAGNOSIS — R0789 Other chest pain: Secondary | ICD-10-CM | POA: Insufficient documentation

## 2024-05-03 DIAGNOSIS — W06XXXA Fall from bed, initial encounter: Secondary | ICD-10-CM | POA: Diagnosis not present

## 2024-05-03 DIAGNOSIS — R519 Headache, unspecified: Secondary | ICD-10-CM | POA: Diagnosis present

## 2024-05-03 DIAGNOSIS — W19XXXA Unspecified fall, initial encounter: Secondary | ICD-10-CM

## 2024-05-03 DIAGNOSIS — S0083XA Contusion of other part of head, initial encounter: Secondary | ICD-10-CM | POA: Diagnosis not present

## 2024-05-03 DIAGNOSIS — M549 Dorsalgia, unspecified: Secondary | ICD-10-CM | POA: Diagnosis not present

## 2024-05-03 NOTE — ED Notes (Signed)
Patient transported to X-ray & CT °

## 2024-05-03 NOTE — ED Triage Notes (Signed)
 Pt c/o hip and back pain after falling out of bed tonight. Pt has lac to the forehead.

## 2024-05-03 NOTE — Discharge Instructions (Signed)
 Your imaging studies did not show any new injuries.  Take Tylenol  as needed for pain and soreness.  Return to the emergency department for any new or worsening symptoms of concern.

## 2024-05-03 NOTE — ED Provider Notes (Signed)
 [place] EMERGENCY DEPARTMENT AT [facility] Provider Note   CSN: [id number] Arrival date & time: 05/03/24  2023     History  Chief Complaint  Patient presents with   [name]    [name] is a 73 y.o. male.   Fall Associated symptoms include chest pain and headaches.  Patient presents for fall.  Medical history includes HIV, seizures, COPD, BPH.  Earlier this evening, he had an unwitnessed fall out of bed.  During his fall, he did strike his forehead.  Although EMS noted hip pain and back pain, patient denies these at this time.  He does endorse pain to forehead area in addition to left-sided chest pain.  He denies any other areas of discomfort.     Home Medications Prior to Admission medications   Medication Sig Start Date End Date Taking? Authorizing Provider  acetaminophen  (TYLENOL ) 500 MG tablet Take 500 mg by mouth in the morning, at noon, and at bedtime. Left should and left upper arm pain    [provider]  Cholecalciferol 25 MCG (1000 UT) capsule Take 1,000 Units by mouth daily.    [provider]  Darunavir -Cobicistat-Emtricitabine -Tenofovir  Alafenamide (SYMTUZA ) 800-150-200-10 MG TABS Take 1 tablet by mouth daily. 07/07/23   Calone, Gregory D, FNP  diclofenac  Sodium (VOLTAREN ) 1 % GEL Apply 2 g topically 4 (four) times daily. To left shoulder for shoulder pain 01/02/24   Mason Sole, Pratik D, DO  feeding supplement (ENSURE ENLIVE / ENSURE PLUS) LIQD Take 237 mLs by mouth 3 (three) times daily between meals. 08/10/22   Vanita Gens, MD  hyoscyamine (ANASPAZ) 0.125 MG TBDP disintergrating tablet Place 0.125 mg under the tongue every 4 (four) hours as needed (dysuria, bladder dysfunction).    [provider]  latanoprost  (XALATAN ) 0.005 % ophthalmic solution Place 1 drop into both eyes at bedtime.    [provider]  loperamide (IMODIUM A-D) 2 MG tablet Take 2 mg by mouth 4 (four) times daily as needed for diarrhea or loose stools.     [provider]  Nutritional Supplements (NUTRITIONAL SUPPLEMENT PO) Take 1 Bar by mouth daily. Frozen nutritional supplement    [provider]  ondansetron  (ZOFRAN ) 4 MG tablet Take 1 tablet (4 mg total) by mouth every 6 (six) hours as needed for nausea. 08/10/22   Vanita Gens, MD  rosuvastatin  (CRESTOR ) 10 MG tablet Take 1 tablet (10 mg total) by mouth daily. 07/07/23   Calone, Gregory D, FNP  tamsulosin  (FLOMAX ) 0.4 MG CAPS capsule Take 0.4 mg by mouth daily. 06/23/23   [provider]  valACYclovir  (VALTREX ) 500 MG tablet Take 500 mg by mouth daily.    [provider]      Allergies    Patient has no known allergies.    Review of Systems   Review of Systems  Cardiovascular:  Positive for chest pain.  Neurological:  Positive for headaches.  All other systems reviewed and are negative.   Physical Exam Updated Vital Signs BP 111/65   Pulse 86   Temp (!) 97.4 F (36.3 C)   Resp 17   Ht 5\' 8"  (1.727 m)   Wt 50 kg   SpO2 100%   BMI 16.76 kg/m  Physical Exam Vitals and nursing note reviewed.  Constitutional:      General: He is not in acute distress.    Appearance: Normal appearance. He is well-developed. He is cachectic. He is ill-appearing (Chronically). He is not toxic-appearing or diaphoretic.  HENT:  Head: Normocephalic.     Comments: Small hematoma to forehead    Right Ear: External ear normal.     Left Ear: External ear normal.     Nose: Nose normal.     Mouth/Throat:     Mouth: Mucous membranes are moist.  Eyes:     Extraocular Movements: Extraocular movements intact.     Conjunctiva/sclera: Conjunctivae normal.  Cardiovascular:     Rate and Rhythm: Normal rate and regular rhythm.     Heart sounds: No murmur heard. Pulmonary:     Effort: Pulmonary effort is normal. No respiratory distress.     Breath sounds: Normal breath sounds. No wheezing, rhonchi or rales.  Chest:     Chest wall: Tenderness present.  Abdominal:      General: There is no distension.     Palpations: Abdomen is soft.     Tenderness: There is no abdominal tenderness.  Musculoskeletal:        General: No swelling or deformity. Normal range of motion.     Cervical back: Normal range of motion and neck supple.     Right lower leg: No edema.     Left lower leg: No edema.  Skin:    General: Skin is warm and dry.     Coloration: Skin is not jaundiced or pale.  Neurological:     General: No focal deficit present.     Mental Status: He is alert and oriented to person, place, and time.  Psychiatric:        Mood and Affect: Mood normal.        Behavior: Behavior normal.     ED Results / Procedures / Treatments   Labs (all labs ordered are listed, but only abnormal results are displayed) Labs Reviewed - No data to display  EKG EKG Interpretation Date/Time:  Monday May 03 2024 21:53:12 EDT Ventricular Rate:  93 PR Interval:  180 QRS Duration:  92 QT Interval:  360 QTC Calculation: 447 R Axis:   -49  Text Interpretation: Sinus rhythm with occasional Premature ventricular complexes Left axis deviation Inferior infarct , age undetermined Confirmed by Iva Mariner [phone]) on 05/03/2024 10:04:15 PM  Radiology DG Pelvis Portable Result Date: 05/03/2024 CLINICAL DATA:  Pelvic pain following fall from bed, initial encounter EXAM: PORTABLE PELVIS 1 VIEWS COMPARISON:  12/31/2023, 12/30/2023 FINDINGS: Pelvic ring is intact. Some retained fecal material is noted within the rectal vault suspicious for early impaction. Scattered large and small bowel gas is noted. Large lamellated bladder calculus is seen similar to that noted on prior CT from 2024. Additionally multiple left renal stones are noted in the renal pelvis also stable from prior CT. Similar findings but to a lesser degree are noted within the right collecting system. IMPRESSION: Findings consistent with early rectal impaction. Bladder and bilateral renal calculi. No acute bony abnormality is  noted. Electronically Signed   By: Violeta Grey M.D.   On: 05/03/2024 21:58   DG Chest Port 1 View Result Date: 05/03/2024 CLINICAL DATA:  Recent fall from bed with chest pain, initial encounter EXAM: PORTABLE CHEST 1 VIEW COMPARISON:  12/30/2023 FINDINGS: Cardiac shadow is stable. Aortic calcifications are seen. Scarring is noted in the left retrocardiac region. Lungs are clear bilaterally. No definitive bony abnormality is seen. IMPRESSION: No acute abnormality noted. Electronically Signed   By: Violeta Grey M.D.   On: 05/03/2024 21:46   CT CERVICAL SPINE WO CONTRAST Result Date: 05/03/2024 CLINICAL DATA:  Fall. EXAM: CT CERVICAL SPINE  WITHOUT CONTRAST TECHNIQUE: Multidetector CT imaging of the cervical spine was performed without intravenous contrast. Multiplanar CT image reconstructions were also generated. RADIATION DOSE REDUCTION: This exam was performed according to the departmental dose-optimization program which includes automated exposure control, adjustment of the mA and/or kV according to patient size and/or use of iterative reconstruction technique. COMPARISON:  05/30/2004 FINDINGS: Alignment: Normal Skull base and vertebrae: No acute fracture. No primary bone lesion or focal pathologic process. Soft tissues and spinal canal: No prevertebral fluid or swelling. No visible canal hematoma. Disc levels: Disc space narrowing at C3-4 and C4-5 with early spurring. Mild to moderate bilateral degenerative facet disease. Upper chest: Severe emphysema. Other: None IMPRESSION: No acute bony abnormality. Electronically Signed   By: Janeece Mechanic M.D.   On: 05/03/2024 21:43   CT HEAD WO CONTRAST Result Date: 05/03/2024 CLINICAL DATA:  Head trauma, moderate-severe.  Fall. EXAM: CT HEAD WITHOUT CONTRAST TECHNIQUE: Contiguous axial images were obtained from the base of the skull through the vertex without intravenous contrast. RADIATION DOSE REDUCTION: This exam was performed according to the departmental  dose-optimization program which includes automated exposure control, adjustment of the mA and/or kV according to patient size and/or use of iterative reconstruction technique. COMPARISON:  None Available. FINDINGS: Brain: There is atrophy and chronic small vessel disease changes. 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 No acute intracranial abnormality. Specifically, no hemorrhage, hydrocephalus, mass lesion, acute infarction, or significant intracranial injury. Vascular: No hyperdense vessel or unexpected calcification. Skull: No acute calvarial abnormality. Sinuses/Orbits: No acute findings Other: None IMPRESSION: Atrophy, chronic microvascular disease. No acute intracranial abnormality. 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 Electronically Signed   By: Janeece Mechanic M.D.   On: 05/03/2024 21:41    Procedures Procedures    Medications Ordered in ED Medications - No data to display  ED Course/ Medical Decision Making/ A&P                                 Medical Decision Making Amount and/or Complexity of Data Reviewed Radiology: ordered.   Patient presents after an unwitnessed fall out of bed.  This occurred at nursing facility.  During this fall, he did strike his head.  On arrival, he does have a small hematoma to his central forehead.  There is no significant overlying laceration.  He endorses some left-sided upper anterior chest pain.  Tenderness is present to this area.  Currently breathing is unlabored.  Lungs are clear to auscultation.  Patient has full range of motion throughout his extremities.  No deformities are noted.  He has no spinal tenderness.  Imaging studies were ordered.  Imaging studies did not show any acute injuries.  Pelvic x-ray was concerning for fecal impaction.  Patient did have a bowel movement while in the ED.  He then underwent manual disimpaction.  Stool in rectal vault was soft.  Patient is stable for discharge.        Final  Clinical Impression(s) / ED Diagnoses Final diagnoses:  Fall, initial encounter    Rx / DC Orders ED Discharge Orders     None         Iva Mariner, MD 05/03/24 2322

## 2024-05-13 NOTE — Progress Notes (Signed)
 The 10-year ASCVD risk score (Arnett DK, et al., 2019) is: 16.8%   Values used to calculate the score:     Age: 73 years     Sex: Male     Is Non-Hispanic African American: Yes     Diabetic: No     Tobacco smoker: Yes     Systolic Blood Pressure: 106 mmHg     Is BP treated: No     HDL Cholesterol: 39 mg/dL     Total Cholesterol: 197 mg/dL  Currently prescribed rosuvastatin  10 mg.   Joshlyn Beadle, BSN, RN

## 2024-05-18 ENCOUNTER — Ambulatory Visit: Admitting: Family

## 2024-06-15 ENCOUNTER — Emergency Department (HOSPITAL_COMMUNITY)
Admission: EM | Admit: 2024-06-15 | Discharge: 2024-06-16 | Disposition: A | Discharge status: Home / Self Care | Attending: Emergency Medicine | Admitting: Emergency Medicine

## 2024-06-15 ENCOUNTER — Emergency Department (HOSPITAL_COMMUNITY)

## 2024-06-15 ENCOUNTER — Other Ambulatory Visit: Payer: Self-pay

## 2024-06-15 ENCOUNTER — Ambulatory Visit (INDEPENDENT_AMBULATORY_CARE_PROVIDER_SITE_OTHER): Admitting: Family

## 2024-06-15 ENCOUNTER — Encounter: Payer: Self-pay | Admitting: Family

## 2024-06-15 VITALS — BP 97/63 | HR 106 | Temp 97.6°F

## 2024-06-15 DIAGNOSIS — B20 Human immunodeficiency virus [HIV] disease: Secondary | ICD-10-CM

## 2024-06-15 DIAGNOSIS — R55 Syncope and collapse: Secondary | ICD-10-CM | POA: Diagnosis present

## 2024-06-15 DIAGNOSIS — Z21 Asymptomatic human immunodeficiency virus [HIV] infection status: Secondary | ICD-10-CM | POA: Diagnosis not present

## 2024-06-15 DIAGNOSIS — Z9189 Other specified personal risk factors, not elsewhere classified: Secondary | ICD-10-CM | POA: Diagnosis not present

## 2024-06-15 DIAGNOSIS — I959 Hypotension, unspecified: Secondary | ICD-10-CM | POA: Diagnosis not present

## 2024-06-15 DIAGNOSIS — Z79899 Other long term (current) drug therapy: Secondary | ICD-10-CM | POA: Diagnosis not present

## 2024-06-15 DIAGNOSIS — R627 Adult failure to thrive: Secondary | ICD-10-CM | POA: Diagnosis not present

## 2024-06-15 DIAGNOSIS — R531 Weakness: Secondary | ICD-10-CM | POA: Insufficient documentation

## 2024-06-15 DIAGNOSIS — Z0000 Encounter for general adult medical examination without abnormal findings: Secondary | ICD-10-CM

## 2024-06-15 LAB — CD4/CD8 (T-HELPER/T-SUPPRESSOR CELL)
CD4 absolute: 342 /uL — ABNORMAL LOW (ref 400–1790)
CD4%: 27.83 % — ABNORMAL LOW (ref 33–65)
CD8 T Cell Abs: 394 /uL (ref 190–1000)
CD8tox: 32.08 % (ref 12–40)
Ratio: 0.87 — ABNORMAL LOW (ref 1.0–3.0)
Total lymphocyte count: 1228 /uL (ref 1000–4000)

## 2024-06-15 LAB — SARS CORONAVIRUS 2 BY RT PCR: SARS Coronavirus 2 by RT PCR: NEGATIVE

## 2024-06-15 LAB — COMPREHENSIVE METABOLIC PANEL WITH GFR
ALT: 9 U/L (ref 0–44)
AST: 18 U/L (ref 15–41)
Albumin: 2.6 g/dL — ABNORMAL LOW (ref 3.5–5.0)
Alkaline Phosphatase: 49 U/L (ref 38–126)
Anion gap: 8 (ref 5–15)
BUN: 11 mg/dL (ref 8–23)
CO2: 19 mmol/L — ABNORMAL LOW (ref 22–32)
Calcium: 11.8 mg/dL — ABNORMAL HIGH (ref 8.9–10.3)
Chloride: 114 mmol/L — ABNORMAL HIGH (ref 98–111)
Creatinine, Ser: 1.24 mg/dL (ref 0.61–1.24)
GFR, Estimated: >60 mL/min (ref >60)
Glucose, Bld: 110 mg/dL — ABNORMAL HIGH (ref 70–99)
Potassium: 3.9 mmol/L (ref 3.5–5.1)
Sodium: 141 mmol/L (ref 135–145)
Total Bilirubin: 0.4 mg/dL (ref 0.0–1.2)
Total Protein: 6.8 g/dL (ref 6.5–8.1)

## 2024-06-15 LAB — CBC WITH DIFFERENTIAL/PLATELET
Abs Immature Granulocytes: 0.03 10*3/uL (ref 0.00–0.07)
Basophils Absolute: 0 10*3/uL (ref 0.0–0.1)
Basophils Relative: 0 %
Eosinophils Absolute: 0.1 10*3/uL (ref 0.0–0.5)
Eosinophils Relative: 1 %
HCT: 33 % — ABNORMAL LOW (ref 39.0–52.0)
Hemoglobin: 10.4 g/dL — ABNORMAL LOW (ref 13.0–17.0)
Immature Granulocytes: 0 %
Lymphocytes Relative: 18 %
Lymphs Abs: 1.5 10*3/uL (ref 0.7–4.0)
MCH: 32.7 pg (ref 26.0–34.0)
MCHC: 31.5 g/dL (ref 30.0–36.0)
MCV: 103.8 fL — ABNORMAL HIGH (ref 80.0–100.0)
Monocytes Absolute: 0.5 10*3/uL (ref 0.1–1.0)
Monocytes Relative: 6 %
Neutro Abs: 5.9 10*3/uL (ref 1.7–7.7)
Neutrophils Relative %: 75 %
Platelets: 192 10*3/uL (ref 150–400)
RBC: 3.18 MIL/uL — ABNORMAL LOW (ref 4.22–5.81)
RDW: 15.3 % (ref 11.5–15.5)
WBC: 7.9 10*3/uL (ref 4.0–10.5)
nRBC: 0 % (ref 0.0–0.2)

## 2024-06-15 LAB — BRAIN NATRIURETIC PEPTIDE: B Natriuretic Peptide: 31.2 pg/mL (ref 0.0–100.0)

## 2024-06-15 MED ORDER — SYMTUZA 800-150-200-10 MG PO TABS: 1.0000 | ORAL_TABLET | Freq: Every day | ORAL | 5 refills | Status: DC

## 2024-06-15 MED ORDER — SODIUM CHLORIDE 0.9 % IV BOLUS
1000.0000 mL | Freq: Once | INTRAVENOUS | Status: AC
  Administered 2024-06-15: 1000 mL via INTRAVENOUS

## 2024-06-15 MED ORDER — ROSUVASTATIN CALCIUM 10 MG PO TABS: 10.0000 mg | ORAL_TABLET | Freq: Every day | ORAL | 5 refills | Status: DC

## 2024-06-15 NOTE — Assessment & Plan Note (Signed)
 [name] continues to have well controlled virus with good adherence and tolerance to Symtuza . Reviewed previous lab work and discussed plan of care and U equals U. Social determinants of health reviewed and no interventions indicated. Check lab work. Continue current dose of Symtuza . Plan for follow up in 6 months or sooner if needed with lab work on the same day

## 2024-06-15 NOTE — ED Provider Notes (Signed)
 [place] EMERGENCY DEPARTMENT AT [facility] Provider Note   CSN: [id number] Arrival date & time: 06/15/24  1213     Patient presents with: Loss of Consciousness and Hypotension   [name] is a 73 y.o. male.   HPI Sickly appearing adult male with HIV multiple other medical problems presents from clinic with concern for near syncope versus syncope.  Patient himself acknowledges generalized weakness, denies focal weakness, acknowledges not eating or drinking substantially.  Per report, this is similar to discussion the patient had at follow-up earlier in the day for ID.  There, the patient was weak in appearance, had episode of near syncope versus syncope and was sent here for evaluation.  Patient denies chest pain or other focal pain.  Per ID note no hypoxia, low blood pressure on their evaluation.    Prior to Admission medications   Medication Sig Start Date End Date Taking? Authorizing Provider  acetaminophen  (TYLENOL ) 500 MG tablet Take 500 mg by mouth in the morning, at noon, and at bedtime. Left should and left upper arm pain    [provider]  Cholecalciferol 25 MCG (1000 UT) capsule Take 1,000 Units by mouth daily.    [provider]  Darunavir -Cobicistat-Emtricitabine -Tenofovir  Alafenamide (SYMTUZA ) 800-150-200-10 MG TABS Take 1 tablet by mouth daily. 06/15/24   Calone, Gregory D, FNP  diclofenac  Sodium (VOLTAREN ) 1 % GEL Apply 2 g topically 4 (four) times daily. To left shoulder for shoulder pain 01/02/24   Mason Sole, Pratik D, DO  feeding supplement (ENSURE ENLIVE / ENSURE PLUS) LIQD Take 237 mLs by mouth 3 (three) times daily between meals. 08/10/22   Vanita Gens, MD  hyoscyamine (ANASPAZ) 0.125 MG TBDP disintergrating tablet Place 0.125 mg under the tongue every 4 (four) hours as needed (dysuria, bladder dysfunction).    [provider]  latanoprost  (XALATAN ) 0.005 % ophthalmic solution Place 1 drop into both eyes at bedtime.    [provider]  loperamide (IMODIUM A-D) 2 MG tablet Take 2 mg by mouth 4 (four) times daily as needed for diarrhea or loose stools.    [provider]  Nutritional Supplements (NUTRITIONAL SUPPLEMENT PO) Take 1 Bar by mouth daily. Frozen nutritional supplement    [provider]  ondansetron  (ZOFRAN ) 4 MG tablet Take 1 tablet (4 mg total) by mouth every 6 (six) hours as needed for nausea. 08/10/22   Vanita Gens, MD  rosuvastatin  (CRESTOR ) 10 MG tablet Take 1 tablet (10 mg total) by mouth daily. 06/15/24   Calone, Gregory D, FNP  tamsulosin  (FLOMAX ) 0.4 MG CAPS capsule Take 0.4 mg by mouth daily. 06/23/23   [provider]  valACYclovir  (VALTREX ) 500 MG tablet Take 500 mg by mouth daily.    [provider]    Allergies: Patient has no known allergies.    Review of Systems  Updated Vital Signs BP 117/75 (BP Location: Right Arm)   Pulse 90   Temp 97.7 F (36.5 C) (Oral)   Resp (!) 23   SpO2 100%   Physical Exam Vitals and nursing note reviewed.  Constitutional:      General: He is not in acute distress.    Appearance: He is well-developed. He is ill-appearing.     Comments: Frail-appearing adult male  HENT:     Head: Normocephalic and atraumatic.   Eyes:     Conjunctiva/sclera: Conjunctivae normal.    Cardiovascular:     Rate and Rhythm: Normal rate and regular rhythm.  Pulmonary:  Effort: Pulmonary effort is normal. No respiratory distress.     Breath sounds: No stridor.  Abdominal:     General: There is no distension.   Skin:    General: Skin is warm and dry.   Neurological:     Mental Status: He is alert and oriented to person, place, and time.     (all labs ordered are listed, but only abnormal results are displayed) Labs Reviewed  COMPREHENSIVE METABOLIC PANEL WITH GFR - Abnormal; Notable for the following components:      Result Value   Chloride 114 (*)    CO2 19 (*)    Glucose, Bld 110 (*)    Calcium  11.8 (*)    Albumin  2.6 (*)    All other components within normal limits  CBC WITH DIFFERENTIAL/PLATELET - Abnormal; Notable for the following components:   RBC 3.18 (*)    Hemoglobin 10.4 (*)    HCT 33.0 (*)    MCV 103.8 (*)    All other components within normal limits  CD4/CD8 (T-HELPER/T-SUPPRESSOR CELL) - Abnormal; Notable for the following components:   CD4% 27.83 (*)    CD4 absolute 342 (*)    Ratio 0.87 (*)    All other components within normal limits  SARS CORONAVIRUS 2 BY RT PCR  BRAIN NATRIURETIC PEPTIDE    EKG: EKG Interpretation Date/Time:  Tuesday June 15 2024 12:35:25 EDT Ventricular Rate:  88 PR Interval:  186 QRS Duration:  88 QT Interval:  372 QTC Calculation: 451 R Axis:   -47  Text Interpretation: Sinus rhythm Inferior infarct, old Anterior infarct, old Baseline wander in lead(s) V1 Confirmed by [name] [phone]) on 06/15/2024 1:10:08 PM  Radiology: [facility] Query Chest Port 1 View Result Date: 06/15/2024 CLINICAL DATA:  Syncope. EXAM: PORTABLE CHEST 1 VIEW COMPARISON:  05/03/2024. FINDINGS: The heart size and mediastinal contours are unchanged. Chronic elevation of the left hemidiaphragm with mild left basilar scarring/atelectasis. Similar upper lobe bullous emphysema. No focal consolidation, sizeable pleural effusion, or pneumothorax. No acute osseous abnormality. Gaseous distention of left upper quadrant bowel loops. IMPRESSION: 1. No acute cardiopulmonary findings. 2. Chronic elevation of the left hemidiaphragm with mild left basilar scarring/atelectasis. 3. Emphysema. Electronically Signed   By: [name] M.D.   On: 06/15/2024 14:18     Procedures   Medications Ordered in the ED  sodium chloride  0.9 % bolus 1,000 mL (1,000 mLs Intravenous New Bag/Given 06/15/24 1250)                                    Medical Decision Making Adult male with multiple medical problems including HIV, reportedly undetectable viral load, CD4 greater than 500 presents after episode of  weakness/syncope/near syncope at clinic.  Patient is awake, alert, has no current complaints on arrival, is following commands, is hemodynamically unremarkable.  Differential includes arrhythmia, dehydration infection.  Patient's eval earlier today with ID reassuring for relatively nonimmunocompromise status. Cardiac 85 sinus normal pulse ox 100% room air normal  Amount and/or Complexity of Data Reviewed Independent Historian: EMS External Data Reviewed: notes.    Details: Notes as above Labs: ordered. Decision-making details documented in ED Course. Radiology: ordered and independent interpretation performed. Decision-making details documented in ED Course. ECG/medicine tests: ordered and independent interpretation performed. Decision-making details documented in ED Course.  Risk Decision regarding hospitalization. Diagnosis or treatment significantly limited by social determinants of health.    Infectious disease  clinic note below HPI:   DUSTYN DANSEREAU is a 73 y.o. male with HIV disease last seen on 07/07/2023 with well-controlled virus and good adherence and tolerance to Symtuza .  Viral load was undetectable with CD4 count 583.  Kidney function, liver function, electrolytes within normal ranges.  Here today for routine follow-up.   Mr. Pavel has been doing okay since his last office visit and arrives today from his skilled facility in a wheelchair. 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 Continues to receive Symtuza  as prescribed with no adverse side effects or problems getting medications. Covered by [employer] and Medicaid. Not been eating great recently and spends a lot of time in bed. Had previously been working with Physical Therapy. Does have nutrition supplements with Ensure/Boost and likes the chocolate flavor. Healthcare maintenance reviewed. Not currently sexually active. Basic needs met by skilled facility. Has been taking levofloxacin  for  pneumonia.   Denies fevers, chills, night sweats, headaches, changes in vision, neck pain/stiffness, nausea, diarrhea, vomiting, lesions or rashes.    Update: Patient remains in no distress has been monitored for hours without decompensation, no new complaints, findings reassuring, some evidence for dehydration clinically and numerically. Patient will follow-up closely as an outpatient.     Final diagnoses:  Syncope and collapse     Timothy Gandy, MD 06/16/24 [phone]

## 2024-06-15 NOTE — Patient Instructions (Addendum)
 Nice to see you.  We will check your lab work today.  Continue to take your medication daily as prescribed.  Encourage oral intake and continue supplementation.   Frequent turns in bed as high risk for pressure injury.   Refills have been sent to the pharmacy.  Plan for follow up in 6 months or sooner if needed with lab work on the same day.  Have a great day and stay safe!

## 2024-06-15 NOTE — Progress Notes (Signed)
 CMA walked in exam room after patient completed blood draw. Staff had concerns due to some altered mental changes. Patient was not responding SNF staff or CMA. Asked provider to evaluate patient.  Provide did see patient and was not responding. EMS contacted.  1129  O2: 98 P: 65 BP not able to  obtain w/ DINAMAP/ manual cuff.  EMS transported [facility] to [facility] for further evaluation. Julien Odor, RMA

## 2024-06-15 NOTE — Discharge Instructions (Signed)
 Today's evaluation has been generally reassuring.  However, with your episode of losing consciousness that is an important to follow-up with your physician.  You have received fluids for additional rehydration given some consideration of dehydration.  It is important to stay well fed, well-hydrated, and monitor your condition carefully.

## 2024-06-15 NOTE — ED Notes (Signed)
 Report called to [place], Charity fundraiser at [facility]. Transport set up.

## 2024-06-15 NOTE — ED Triage Notes (Signed)
 Patient BIB [facility] from [facility] for syncopal episode during lab draws. Patient initial BP with EMS was 60s palpated, patient was diaphoretic and going in and out of consciousness. Patient BP back to baseline at this time, EMS administered 100mL NS through 24G right forearm IV. Staff reported significant weight loss since last visit 6 months ago as well. Hx of HIV, decubitus ulcer, ETCO2 in 20s, RR 26, 99% RA, CBG 116, HR 80s.

## 2024-06-15 NOTE — Assessment & Plan Note (Signed)
 [name] appears to have lost additional weight since he was last seen. Unable to obtain weight as he was in a wheelchair. Concerned about his oral intake which has been complicated by his blindness and need for assistance. He is drinking nutrition supplements. He is at high risk for pressure injury. Have encouraged protein and oral intake.

## 2024-06-15 NOTE — Progress Notes (Signed)
 Brief Narrative   [name], male    DOB: 1951-12-07, 73 y.o.   MRN: [id number]  Mr. Gentle is a 73 y/o AA male with HIV/AIDS with risk factor of MSM. Initial viral load, CD4 and Genosure are unavailable. Initial clinic viral load was 161,000 and CD4 count 9 on 06/21/04. History of PCP. ART experienced with Kaletra , Combivir , Prezista /ritonavir , Truvada and now Symtuza .    Subjective:   Chief Complaint  Patient presents with   Follow-up    B20    HPI:  TAIDEN Spears is a 73 y.o. male with HIV disease last seen on 07/07/2023 with well-controlled virus and good adherence and tolerance to Symtuza .  Viral load was undetectable with CD4 count 583.  Kidney function, liver function, electrolytes within normal ranges.  Here today for routine follow-up.  [name] has been doing okay since his last office visit and arrives today from his skilled facility in a wheelchair. 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 Continues to receive Symtuza  as prescribed with no adverse side effects or problems getting medications. Covered by [employer] and Medicaid. Not been eating great recently and spends a lot of time in bed. Had previously been working with Physical Therapy. Does have nutrition supplements with Ensure/Boost and likes the chocolate flavor. Healthcare maintenance reviewed. Not currently sexually active. Basic needs met by skilled facility. Has been taking levofloxacin  for pneumonia.  Denies fevers, chills, night sweats, headaches, changes in vision, neck pain/stiffness, nausea, diarrhea, vomiting, lesions or rashes.  Lab Results  Component Value Date   CD4TCELL 20 (L) 07/07/2023   CD4TABS 460 12/31/2022   Lab Results  Component Value Date   HIV1RNAQUANT <20 (H) 07/07/2023     No Known Allergies    No facility-administered medications prior to visit.   Outpatient Medications Prior to Visit  Medication Sig Dispense  Refill   acetaminophen  (TYLENOL ) 500 MG tablet Take 500 mg by mouth in the morning, at noon, and at bedtime. Left should and left upper arm pain     Cholecalciferol 25 MCG (1000 UT) capsule Take 1,000 Units by mouth daily.     diclofenac  Sodium (VOLTAREN ) 1 % GEL Apply 2 g topically 4 (four) times daily. To left shoulder for shoulder pain 50 g 0   feeding supplement (ENSURE ENLIVE / ENSURE PLUS) LIQD Take 237 mLs by mouth 3 (three) times daily between meals. 237 mL 12   hyoscyamine (ANASPAZ) 0.125 MG TBDP disintergrating tablet Place 0.125 mg under the tongue every 4 (four) hours as needed (dysuria, bladder dysfunction).     latanoprost  (XALATAN ) 0.005 % ophthalmic solution Place 1 drop into both eyes at bedtime.     loperamide (IMODIUM A-D) 2 MG tablet Take 2 mg by mouth 4 (four) times daily as needed for diarrhea or loose stools.     Nutritional Supplements (NUTRITIONAL SUPPLEMENT PO) Take 1 Bar by mouth daily. Frozen nutritional supplement     ondansetron  (ZOFRAN ) 4 MG tablet Take 1 tablet (4 mg total) by mouth every 6 (six) hours as needed for nausea. 20 tablet 0   tamsulosin  (FLOMAX ) 0.4 MG CAPS capsule Take 0.4 mg by mouth daily.     valACYclovir  (VALTREX ) 500 MG tablet Take 500 mg by mouth daily.     Darunavir -Cobicistat-Emtricitabine -Tenofovir  Alafenamide (SYMTUZA ) 800-150-200-10 MG TABS Take 1 tablet by mouth daily. 30 tablet 5   rosuvastatin  (CRESTOR ) 10 MG tablet Take 1 tablet (10 mg total) by mouth daily. 30  tablet 5     Past Medical History:  Diagnosis Date   Adult failure to thrive    Blind    BPH (benign prostatic hyperplasia)    COPD (chronic obstructive pulmonary disease) (HCC)    Dysphagia    HIV (human immunodeficiency virus infection) (HCC)    Metabolic encephalopathy    Seizures (HCC)    Tobacco use    UTI (urinary tract infection)    Vitamin D  deficiency      Past Surgical History:  Procedure Laterality Date   HAND SURGERY       Review of Systems   Constitutional:  Negative for appetite change, chills, fatigue, fever and unexpected weight change.  Eyes:  Negative for visual disturbance.  Respiratory:  Negative for cough, chest tightness, shortness of breath and wheezing.   Cardiovascular:  Negative for chest pain and leg swelling.  Gastrointestinal:  Negative for abdominal pain, constipation, diarrhea, nausea and vomiting.  Genitourinary:  Negative for dysuria, flank pain, frequency, genital sores, hematuria and urgency.  Skin:  Negative for rash.  Allergic/Immunologic: Negative for immunocompromised state.  Neurological:  Negative for dizziness and headaches.     Objective:   BP 97/63   Pulse (!) 106   Temp 97.6 F (36.4 C) (Oral)   SpO2 99%  Nursing note and vital signs reviewed.  Physical Exam Constitutional:      General: He is not in acute distress.    Appearance: He is well-developed. He is cachectic.   Cardiovascular:     Rate and Rhythm: Normal rate and regular rhythm.     Heart sounds: Normal heart sounds.  Pulmonary:     Effort: Pulmonary effort is normal.     Breath sounds: Normal breath sounds.   Skin:    General: Skin is warm and dry.   Neurological:     Mental Status: He is alert and oriented to person, place, and time.   Psychiatric:        Behavior: Behavior normal.        Thought Content: Thought content normal.        Judgment: Judgment normal.          07/07/2023   10:21 AM 12/31/2022    9:25 AM 09/25/2022   10:05 AM 08/22/2022    1:47 PM 05/23/2020    9:23 AM  Depression screen PHQ 2/9  Decreased Interest 0 0 0 0 0  Down, Depressed, Hopeless 0 0 0 0 0  PHQ - 2 Score 0 0 0 0 0         No data to display           The 10-year ASCVD risk score (Arnett DK, et al., 2019) is: 13.9%   Values used to calculate the score:     Age: 63 years     Clincally relevant sex: Male     Is Non-Hispanic African American: Yes     Diabetic: No     Tobacco smoker: Yes     Systolic Blood Pressure:  93 mmHg     Is BP treated: No     HDL Cholesterol: 39 mg/dL     Total Cholesterol: 197 mg/dL      Assessment & Plan:    Patient Active Problem List   Diagnosis Date Noted   SBO (small bowel obstruction) (HCC) 12/30/2023   Glaucoma 12/30/2023   Dysuria 07/07/2023   At increased risk for cardiovascular disease 07/07/2023   Vision changes 09/25/2022   Hypovitaminosis  D 08/10/2022   Hypernatremia 08/08/2022   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   Failure to thrive in adult 08/04/2022   Acute metabolic encephalopathy 08/04/2022   Hypercalcemia 08/04/2022   UTI (urinary tract infection) 08/04/2022   Dysarthria 08/04/2022   Urinary incontinence    Fracture, [place], right, closed 01/20/2020   Healthcare maintenance 06/17/2019   Tobacco use 06/17/2019   Infestation by bed bug 11/02/2018   Underweight 11/02/2018   ERECTILE DYSFUNCTION 04/13/2008   Dental caries 08/19/2007   AIDS (acquired immune deficiency syndrome) (HCC) 10/22/2006   Genital herpes 10/22/2006   PNEUMOCYSTIS PNEUMONIA 10/22/2006   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   SEIZURE DISORDER 10/22/2006     Problem List Items Addressed This Visit       Other   AIDS (acquired immune deficiency syndrome) (HCC) - Primary   Mr. Sensing continues to have well controlled virus with good adherence and tolerance to Symtuza . Reviewed previous lab work and discussed plan of care and U equals U. Social determinants of health reviewed and no interventions indicated. Check lab work. Continue current dose of Symtuza . Plan for follow up in 6 months or sooner if needed with lab work on the same day      Relevant Medications   Darunavir -Cobicistat-Emtricitabine -Tenofovir  Alafenamide (SYMTUZA ) 800-150-200-10 MG TABS   Other Relevant Orders   Comprehensive metabolic panel with GFR   HIV-1 RNA quant-no reflex-bld   T-helper cells (CD4) count (not at [facility])   Healthcare maintenance   Not currently  sexually active.  Vaccinations reviewed and deferred.  Declines colonoscopy.       Failure to thrive in adult   Mr. Fortin appears to have lost additional weight since he was last seen. Unable to obtain weight as he was in a wheelchair. Concerned about his oral intake which has been complicated by his blindness and need for assistance. He is drinking nutrition supplements. He is at high risk for pressure injury. Have encouraged protein and oral intake.       At increased risk for cardiovascular disease   Other Visit Diagnoses       Pharmacologic therapy       Relevant Orders   Lipid panel        I am having Bernarda Bride. Cattell maintain his ondansetron , feeding supplement, tamsulosin , Nutritional Supplements (NUTRITIONAL SUPPLEMENT PO), Cholecalciferol, latanoprost , valACYclovir , acetaminophen , hyoscyamine, loperamide, diclofenac  Sodium, Symtuza , and rosuvastatin .   Meds ordered this encounter  Medications   Darunavir -Cobicistat-Emtricitabine -Tenofovir  Alafenamide (SYMTUZA ) 800-150-200-10 MG TABS    Sig: Take 1 tablet by mouth daily.    Dispense:  30 tablet    Refill:  5    Supervising Provider:   SNIDER, CYNTHIA [[id number]]   rosuvastatin  (CRESTOR ) 10 MG tablet    Sig: Take 1 tablet (10 mg total) by mouth daily.    Dispense:  30 tablet    Refill:  5    Supervising Provider:   Liane Redman [phone]   ADDENDUM: Called to room following blood draw with concern for decreased responsiveness/altered mental status. Initially did not respond to verbal or painful stimuli. Carotid pulse was present but did not appear to breathing well. EMS was activated and AED was requested. Just prior to lowering him to the ground he began speaking and breathing adequately once again. Had periods of lethargy and decreased responsiveness. Hypotensive with adequate oxygen saturation. Discussed with Mr. Pember that I thought it best he be at least evaluated at the hospital as this was likely  syncope/vasovagal response  with dehydration although could not rule additional underlying issues. Transported via EMS.  Follow-up: Return in about 6 months (around 12/15/2024). or sooner if needed.    Marlan Silva, MSN, FNP-C Nurse Practitioner [facility] Main number: [phone]

## 2024-06-15 NOTE — Assessment & Plan Note (Signed)
 Not currently sexually active.  Vaccinations reviewed and deferred.  Declines colonoscopy.

## 2024-06-17 LAB — LIPID PANEL
Cholesterol: 109 mg/dL (ref <200)
HDL: 41 mg/dL (ref >=40)
LDL Cholesterol (Calc): 52 mg/dL
Non-HDL Cholesterol (Calc): 68 mg/dL (ref <130)
Total CHOL/HDL Ratio: 2.7 (calc) (ref <5.0)
Triglycerides: 76 mg/dL (ref <150)

## 2024-06-17 LAB — COMPREHENSIVE METABOLIC PANEL WITH GFR
AG Ratio: 0.8 (calc) — ABNORMAL LOW (ref 1.0–2.5)
ALT: 6 U/L — ABNORMAL LOW (ref 9–46)
AST: 12 U/L (ref 10–35)
Albumin: 3.2 g/dL — ABNORMAL LOW (ref 3.6–5.1)
Alkaline phosphatase (APISO): 58 U/L (ref 35–144)
BUN: 13 mg/dL (ref 7–25)
CO2: 20 mmol/L (ref 20–32)
Calcium: 12.1 mg/dL — ABNORMAL HIGH (ref 8.6–10.3)
Chloride: 109 mmol/L (ref 98–110)
Creat: 1.07 mg/dL (ref 0.70–1.28)
Globulin: 3.9 g/dL — ABNORMAL HIGH (ref 1.9–3.7)
Glucose, Bld: 89 mg/dL (ref 65–99)
Potassium: 3.8 mmol/L (ref 3.5–5.3)
Sodium: 140 mmol/L (ref 135–146)
Total Bilirubin: 0.3 mg/dL (ref 0.2–1.2)
Total Protein: 7.1 g/dL (ref 6.1–8.1)
eGFR: 73 mL/min/{1.73_m2} (ref >=60)

## 2024-06-17 LAB — T-HELPER CELLS (CD4) COUNT (NOT AT ARMC)
Absolute CD4: 639 {cells}/uL (ref 490–1740)
CD4 T Helper %: 23 % — ABNORMAL LOW (ref 30–61)
Total lymphocyte count: 2807 {cells}/uL (ref 850–3900)

## 2024-06-17 LAB — HIV-1 RNA QUANT-NO REFLEX-BLD
HIV 1 RNA Quant: <20 {copies}/mL — AB
HIV-1 RNA Quant, Log: <1.3 {Log_copies}/mL — AB

## 2024-09-20 ENCOUNTER — Emergency Department (HOSPITAL_COMMUNITY)
Admission: EM | Admit: 2024-09-20 | Discharge: 2024-09-29 | Disposition: E | Source: Skilled Nursing Facility | Attending: Emergency Medicine | Admitting: Emergency Medicine

## 2024-09-20 DIAGNOSIS — R0603 Acute respiratory distress: Secondary | ICD-10-CM | POA: Diagnosis present

## 2024-09-20 DIAGNOSIS — I469 Cardiac arrest, cause unspecified: Secondary | ICD-10-CM | POA: Insufficient documentation

## 2024-09-20 DIAGNOSIS — B20 Human immunodeficiency virus [HIV] disease: Secondary | ICD-10-CM | POA: Diagnosis not present

## 2024-09-20 DIAGNOSIS — E43 Unspecified severe protein-calorie malnutrition: Secondary | ICD-10-CM | POA: Insufficient documentation

## 2024-09-20 LAB — CBG MONITORING, ED: Glucose-Capillary: 102 mg/dL — ABNORMAL HIGH (ref 70–99)

## 2024-09-29 NOTE — ED Triage Notes (Signed)
 Patient arrived via [facility] from [facility]. [facility] called out with complaints of change in condition and oxygen 68% on RA. Patient was put on 15L [place] at facility. Patient has a MOST form in packet. Patient is not responsive to pain.

## 2024-09-29 NOTE — Code Documentation (Signed)
 Pt noted to be pulses, hr brady down to asystole, cpr started by ed staffed. Dr Bernard at bedside,

## 2024-09-29 NOTE — ED Notes (Signed)
Dr Steinl at bedside,  

## 2024-09-29 NOTE — ED Provider Notes (Signed)
 [place] EMERGENCY DEPARTMENT AT [facility] Provider Note   CSN: [id number] Arrival date & time: 2024-10-17  1130     Patient presents with: Respiratory Distress   [name] is a 73 y.o. male.   Pt arrives via EMS from SNF with report that patient in hospice care, and that patient 'has been dying',  but that 'facility did not want to pass there'. Pt unresponsive, agonal breathing. Pt unable to provide any history - level 5 caveat. No report of trauma/fall. No report of fevers.   The history is provided by the patient, the EMS personnel and medical records. The history is limited by the condition of the patient.       Prior to Admission medications   Medication Sig Start Date End Date Taking? Authorizing Provider  acetaminophen  (TYLENOL ) 500 MG tablet Take 500 mg by mouth in the morning, at noon, and at bedtime. Left should and left upper arm pain    [provider]  Cholecalciferol 25 MCG (1000 UT) capsule Take 1,000 Units by mouth daily.    [provider]  Darunavir -Cobicistat-Emtricitabine -Tenofovir  Alafenamide (SYMTUZA ) 800-150-200-10 MG TABS Take 1 tablet by mouth daily. 06/15/24   Calone, Gregory D, FNP  diclofenac  Sodium (VOLTAREN ) 1 % GEL Apply 2 g topically 4 (four) times daily. To left shoulder for shoulder pain 01/02/24   Maree, Pratik D, DO  feeding supplement (ENSURE ENLIVE / ENSURE PLUS) LIQD Take 237 mLs by mouth 3 (three) times daily between meals. 08/10/22   Luke Agent, MD  hyoscyamine (ANASPAZ) 0.125 MG TBDP disintergrating tablet Place 0.125 mg under the tongue every 4 (four) hours as needed (dysuria, bladder dysfunction).    [provider]  latanoprost  (XALATAN ) 0.005 % ophthalmic solution Place 1 drop into both eyes at bedtime.    [provider]  loperamide (IMODIUM A-D) 2 MG tablet Take 2 mg by mouth 4 (four) times daily as needed for diarrhea or loose stools.    [provider]  Nutritional Supplements  (NUTRITIONAL SUPPLEMENT PO) Take 1 Bar by mouth daily. Frozen nutritional supplement    [provider]  ondansetron  (ZOFRAN ) 4 MG tablet Take 1 tablet (4 mg total) by mouth every 6 (six) hours as needed for nausea. 08/10/22   Luke Agent, MD  rosuvastatin  (CRESTOR ) 10 MG tablet Take 1 tablet (10 mg total) by mouth daily. 06/15/24   Calone, Gregory D, FNP  tamsulosin  (FLOMAX ) 0.4 MG CAPS capsule Take 0.4 mg by mouth daily. 06/23/23   [provider]  valACYclovir  (VALTREX ) 500 MG tablet Take 500 mg by mouth daily.    [provider]    Allergies: Patient has no known allergies.    Review of Systems  Unable to perform ROS: Patient unresponsive    Updated Vital Signs There were no vitals taken for this visit.  Physical Exam Vitals and nursing note reviewed.  Constitutional:      Appearance: He is well-developed.     Comments: Frail, very cachectic, chronically ill appearing.   HENT:     Head: Atraumatic.     Nose: Nose normal.     Mouth/Throat:     Mouth: Mucous membranes are dry.     Pharynx: Oropharynx is clear.  Eyes:     General: No scleral icterus.    Conjunctiva/sclera: Conjunctivae normal.     Comments: Pupils not responsive.   Neck:     Trachea: No tracheal deviation.     Comments: Trachea midline.  Cardiovascular:  Comments: No audible cardiac activity or pulses.  Pulmonary:     Effort: No accessory muscle usage.     Comments: Agonal resp followed by apneic on arrival.  Abdominal:     General: There is no distension.     Palpations: Abdomen is soft.  Musculoskeletal:        General: No swelling.     Cervical back: No tenderness.     Comments: Extremely thin, cachectic appearing.   Skin:    General: Skin is dry.     Findings: No rash.  Neurological:     Comments: Pupils not responsive. No response to stimuli. Apneic. GCS 3.      (all labs ordered are listed, but only abnormal results are displayed) Results for orders placed or  performed during the hospital encounter of 2024/10/18  CBG monitoring, ED   Collection Time: October 18, 2024 11:38 AM  Result Value Ref Range   Glucose-Capillary 102 (H) 70 - 99 mg/dL    EKG: None  Radiology: No results found.   Procedures   Medications Ordered in the ED - No data to display                                  Medical Decision Making Problems Addressed: AIDS (acquired immune deficiency syndrome) (HCC): chronic illness or injury with exacerbation, progression, or side effects of treatment that poses a threat to life or bodily functions Cardiorespiratory arrest [facility]): acute illness or injury with systemic symptoms that poses a threat to life or bodily functions Severe protein-calorie malnutrition (HCC): chronic illness or injury with exacerbation, progression, or side effects of treatment that poses a threat to life or bodily functions  Amount and/or Complexity of Data Reviewed Independent Historian: EMS    Details: hx External Data Reviewed: notes. Labs: ordered. Decision-making details documented in ED Course.  Risk Decision regarding hospitalization.   Iv ns. Continuous pulse ox and cardiac monitoring. Labs ordered/sent.  Differential diagnosis includes dying process, acs, sepsis, etc. Dispo decision including potential need for admission considered - will get labs and reassess.   Reviewed nursing notes and prior charts for additional history. External reports reviewed. Additional history from: EMS.   Labs reviewed/interpreted by me - glucose normal.   Initially confusing report of in hospice care and has been dying, but paperwork on scene referencing full code.   PEA, rate 40.  Apneic.   Patient very cachectic, reported in hospice care and appears to be in process of dying. Pt unresponsive to stimuli, apneic, pulseless - pt shows no signs of life, in asystole on monitor.  Family called that brother is passing/has passed - notified brother.  Patient apneic,  pulseless.   Pt pronounced dead at 1144.        Final diagnoses:  None    ED Discharge Orders     None          Bernard Drivers, MD Oct 18, 2024 1220

## 2024-09-29 NOTE — ED Notes (Signed)
 Nurse at [facility] aware that patient passed at 1144. [facility] nurse aware as well.

## 2024-09-29 DEATH — deceased
